# Patient Record
Sex: Female | Born: 1995
Health system: Southern US, Community
[De-identification: ages and names within clinical notes are randomized; demographics above are authoritative.]

## PROBLEM LIST (undated history)

## (undated) DIAGNOSIS — F329 Major depressive disorder, single episode, unspecified: Secondary | ICD-10-CM

## (undated) DIAGNOSIS — J45909 Unspecified asthma, uncomplicated: Secondary | ICD-10-CM

## (undated) DIAGNOSIS — F32A Depression, unspecified: Secondary | ICD-10-CM

## (undated) DIAGNOSIS — N938 Other specified abnormal uterine and vaginal bleeding: Secondary | ICD-10-CM

## (undated) DIAGNOSIS — E282 Polycystic ovarian syndrome: Secondary | ICD-10-CM

## (undated) DIAGNOSIS — F419 Anxiety disorder, unspecified: Secondary | ICD-10-CM

## (undated) DIAGNOSIS — R12 Heartburn: Secondary | ICD-10-CM

## (undated) HISTORY — DX: Heartburn: R12

## (undated) HISTORY — PX: TONSILLECTOMY: SUR1361

---
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
## 2013-04-11 ENCOUNTER — Encounter: Payer: Self-pay | Admitting: Family Medicine

## 2013-04-11 ENCOUNTER — Ambulatory Visit (INDEPENDENT_AMBULATORY_CARE_PROVIDER_SITE_OTHER): Payer: No Typology Code available for payment source | Admitting: Family Medicine

## 2013-04-11 ENCOUNTER — Telehealth: Payer: Self-pay | Admitting: Family Medicine

## 2013-04-11 VITALS — BP 128/72 | Ht 64.0 in | Wt 236.4 lb

## 2013-04-11 DIAGNOSIS — G43009 Migraine without aura, not intractable, without status migrainosus: Secondary | ICD-10-CM

## 2013-04-11 DIAGNOSIS — Z0000 Encounter for general adult medical examination without abnormal findings: Secondary | ICD-10-CM

## 2013-04-11 DIAGNOSIS — R5381 Other malaise: Secondary | ICD-10-CM

## 2013-04-11 DIAGNOSIS — E282 Polycystic ovarian syndrome: Secondary | ICD-10-CM

## 2013-04-11 DIAGNOSIS — K2990 Gastroduodenitis, unspecified, without bleeding: Secondary | ICD-10-CM

## 2013-04-11 DIAGNOSIS — E785 Hyperlipidemia, unspecified: Secondary | ICD-10-CM

## 2013-04-11 DIAGNOSIS — J45901 Unspecified asthma with (acute) exacerbation: Secondary | ICD-10-CM

## 2013-04-11 DIAGNOSIS — K2970 Gastritis, unspecified, without bleeding: Secondary | ICD-10-CM

## 2013-04-11 DIAGNOSIS — L600 Ingrowing nail: Secondary | ICD-10-CM

## 2013-04-11 MED ORDER — TOPIRAMATE 25 MG PO TABS: ORAL_TABLET | ORAL | Status: DC

## 2013-04-11 MED ORDER — ALBUTEROL SULFATE HFA 108 (90 BASE) MCG/ACT IN AERS: 2.0000 | INHALATION_SPRAY | Freq: Four times a day (QID) | RESPIRATORY_TRACT | Status: DC | PRN

## 2013-04-11 MED ORDER — DOXYCYCLINE HYCLATE 100 MG PO CAPS: 100.0000 mg | ORAL_CAPSULE | Freq: Two times a day (BID) | ORAL | Status: DC

## 2013-04-11 NOTE — Telephone Encounter (Signed)
Patient forgot to ask Dr. Brett Canales for a refill on inhaler at visit.  [facility]

## 2013-04-11 NOTE — Telephone Encounter (Signed)
Rx sent electronically to pharmacy. Patient notified. 

## 2013-04-11 NOTE — Telephone Encounter (Signed)
Ok may ref plus 2 ref 

## 2013-04-11 NOTE — Progress Notes (Signed)
  Subjective:    Patient ID: Stephanie Torres, female    DOB: Mar 03, 1996, 17 y.o.   MRN: [id number]  HPI Comments: Still having infection with her 2 great toes  Migraine  This is a recurrent problem. The current episode started 1 to 4 weeks ago. The problem occurs daily. The pain is located in the retro-orbital and left unilateral region. The pain radiates to the left neck and face. The pain quality is similar to prior headaches. Associated symptoms include eye pain. The symptoms are aggravated by sneezing and emotional stress (School). Treatments tried: Maxalt. The treatment provided mild relief.   Tried mxalt--didn't help much.  Pos sens to sound Of note the Maxalt helps the headaches a little but over-the-counter caffeine product analgesics seem to help more.  usin inhaler every couple days or so. Would like a refill on the inhaler. No chest pain with this.  Still having difficulty with ingrown toenails. See notes from this summer. We had to do a partial excision. Still having difficulties.  Mother appears surprised that the child has been given metformin for hyperinsulinemia. She states she did not realize that this was a pre-diabetes condition. Long discussion held in this regard.   Review of Systems  Eyes: Positive for pain.   No shortness of breath no abdominal pain no change in bowel habits some nausea with migraines is noted ROS otherwise negative.    Objective:   Physical Exam Alert no apparent distress. HEENT normal. Lungs clear. Heart regular rate and rhythm. Abdomen soft. Large toes left greater than right with chronic inflammation and in duration moderate tenderness on left       Assessment & Plan:  Impression #1 migraine headaches worsening at least 2 per week. #2 asthma fairly stable though not ideal discussed #3 impaired fasting glucose or prediabetes discussed at length. I tried to describe rationale for starting the metformin and how it may be of some benefit #4  recurrent ingrown nails with element of infection plan podiatry consult. Initiate Topamax. Antibiotics prescribed. Inhaler refilled. Diet exercise discussed. A least 35 minutes spent most in covering all of these complex issues. Followup with [facility] as originally requested earlier in the summer. Appropriate blood work before then. WSL

## 2013-04-12 DIAGNOSIS — L600 Ingrowing nail: Secondary | ICD-10-CM | POA: Insufficient documentation

## 2013-05-05 ENCOUNTER — Telehealth: Payer: Self-pay | Admitting: Family Medicine

## 2013-05-05 NOTE — Telephone Encounter (Signed)
Papers faxed to number listed. Mom was notified.

## 2013-05-05 NOTE — Telephone Encounter (Signed)
Patient has misplaced blood work orders and would like them faxed to [phone]

## 2013-05-07 ENCOUNTER — Ambulatory Visit: Payer: No Typology Code available for payment source | Admitting: Nurse Practitioner

## 2013-07-08 ENCOUNTER — Ambulatory Visit (INDEPENDENT_AMBULATORY_CARE_PROVIDER_SITE_OTHER): Payer: No Typology Code available for payment source | Admitting: Family Medicine

## 2013-07-08 ENCOUNTER — Encounter: Payer: Self-pay | Admitting: Family Medicine

## 2013-07-08 VITALS — BP 110/72 | Temp 98.2°F | Ht 64.0 in | Wt 235.0 lb

## 2013-07-08 DIAGNOSIS — L259 Unspecified contact dermatitis, unspecified cause: Secondary | ICD-10-CM

## 2013-07-08 MED ORDER — DESONIDE 0.05 % EX CREA: TOPICAL_CREAM | Freq: Two times a day (BID) | CUTANEOUS | Status: DC

## 2013-07-08 MED ORDER — PREDNISONE 20 MG PO TABS: ORAL_TABLET | ORAL | Status: AC

## 2013-07-08 NOTE — Progress Notes (Signed)
   Subjective:    Patient ID: Stephanie Torres, female    DOB: 11/01/1995, 18 y.o.   MRN: [id number]  Rash This is a new problem. The current episode started today. The affected locations include the face and left arm. The rash is characterized by redness.   No respiratory symptoms   Review of Systems  Skin: Positive for rash.   Denies fever pain headaches    Objective:   Physical Exam On the face she has significant contact dermatitis on left side of face also around the nasal fold area and not any on the neck lungs are clear hearts regular   Patient was seen after hours to prevent ER visit    Assessment & Plan:  Contact dermatitis could be due to poison ivy patient is not sure where she got exposed. Prednisone taper Benadryl when necessary may use steroid cream when necessary followup if ongoing troubles

## 2013-07-19 ENCOUNTER — Other Ambulatory Visit: Payer: Self-pay | Admitting: Family Medicine

## 2013-07-21 ENCOUNTER — Other Ambulatory Visit: Payer: Self-pay | Admitting: Nurse Practitioner

## 2013-07-21 ENCOUNTER — Telehealth: Payer: Self-pay | Admitting: Family Medicine

## 2013-07-21 MED ORDER — DROSPIRENONE-ETHINYL ESTRADIOL 3-0.03 MG PO TABS: ORAL_TABLET | ORAL | Status: DC

## 2013-07-21 NOTE — Telephone Encounter (Signed)
Refill sent in

## 2013-07-21 NOTE — Telephone Encounter (Signed)
Patient needs Rx for OCELLA 3-0.03 MG tablet. She is completely out and needs this today.    [facility]

## 2013-08-06 ENCOUNTER — Ambulatory Visit (INDEPENDENT_AMBULATORY_CARE_PROVIDER_SITE_OTHER): Payer: No Typology Code available for payment source | Admitting: Nurse Practitioner

## 2013-08-06 ENCOUNTER — Encounter: Payer: Self-pay | Admitting: Nurse Practitioner

## 2013-08-06 ENCOUNTER — Encounter: Payer: Self-pay | Admitting: Family Medicine

## 2013-08-06 VITALS — BP 122/82 | Temp 98.4°F | Ht 64.0 in | Wt 236.6 lb

## 2013-08-06 DIAGNOSIS — J111 Influenza due to unidentified influenza virus with other respiratory manifestations: Secondary | ICD-10-CM

## 2013-08-06 MED ORDER — OSELTAMIVIR PHOSPHATE 75 MG PO CAPS: 75.0000 mg | ORAL_CAPSULE | Freq: Two times a day (BID) | ORAL | Status: DC

## 2013-08-06 MED ORDER — DROSPIRENONE-ETHINYL ESTRADIOL 3-0.03 MG PO TABS: ORAL_TABLET | ORAL | Status: DC

## 2013-08-06 NOTE — Progress Notes (Signed)
Subjective:  Presents for c/o flu-like symptoms. Began nonprod cough and fever today. Myalgias.  Fatigue.Sore throat. Headache. Nausea no vomiting. Slight diarrhea about twice per day. Mild abd pain. Wheezing only with prolonged cough. Taking fluids well. Voiding nl. Requesting RF on oc's; denies any problems.  Objective:   BP 122/82  Temp(Src) 98.4 F (36.9 C) (Oral)  Ht 5\' 4"  (1.626 m)  Wt 236 lb 9.6 oz (107.321 kg)  BMI 40.59 kg/m2 NAD. Alert, oriented. TMs clear effusion. Pharynx clear. Neck supple with mild anterior adenopathy. Lungs clear. Heart RRR. Abd soft, nondistended with mild epigastric area tenderness.  Assessment: Influenza viral illness  Plan:  Meds ordered this encounter  Medications  . oseltamivir (TAMIFLU) 75 MG capsule    Sig: Take 1 capsule (75 mg total) by mouth 2 (two) times daily.    Dispense:  10 capsule    Refill:  0    Order Specific Question:  Supervising Provider    Answer:  Mikey Kirschner [[id number]]  . drospirenone-ethinyl estradiol (OCELLA) 3-0.03 MG tablet    Sig: One po qd as directed    Dispense:  28 tablet    Refill:  11    Order Specific Question:  Supervising Provider    Answer:  Mikey Kirschner [[id number]]   Influenza-the patient was diagnosed with influenza. Patient/family educated about the flu and warning signs to watch for. If difficulty breathing, severe neck pain and stiffness, cyanosis, disorientation, or progressive worsening then immediately get rechecked at that ER. If progressive symptoms be certain to be rechecked. Supportive measures such as Tylenol/ibuprofen was discussed. No aspirin use in children. And influenza home care instruction sheet was given. Call back in 48 hours if no improvement, sooner if worse.

## 2013-08-06 NOTE — Patient Instructions (Signed)

## 2013-09-14 ENCOUNTER — Encounter (HOSPITAL_COMMUNITY): Payer: Self-pay | Admitting: Emergency Medicine

## 2013-09-14 ENCOUNTER — Emergency Department (HOSPITAL_COMMUNITY)
Admission: EM | Admit: 2013-09-14 | Discharge: 2013-09-14 | Disposition: A | Discharge status: Home / Self Care | Payer: No Typology Code available for payment source | Attending: Emergency Medicine | Admitting: Emergency Medicine

## 2013-09-14 DIAGNOSIS — IMO0002 Reserved for concepts with insufficient information to code with codable children: Secondary | ICD-10-CM | POA: Insufficient documentation

## 2013-09-14 DIAGNOSIS — Z3202 Encounter for pregnancy test, result negative: Secondary | ICD-10-CM | POA: Insufficient documentation

## 2013-09-14 DIAGNOSIS — R110 Nausea: Secondary | ICD-10-CM | POA: Insufficient documentation

## 2013-09-14 DIAGNOSIS — N946 Dysmenorrhea, unspecified: Secondary | ICD-10-CM | POA: Insufficient documentation

## 2013-09-14 DIAGNOSIS — Z79899 Other long term (current) drug therapy: Secondary | ICD-10-CM | POA: Insufficient documentation

## 2013-09-14 DIAGNOSIS — J45909 Unspecified asthma, uncomplicated: Secondary | ICD-10-CM | POA: Insufficient documentation

## 2013-09-14 HISTORY — DX: Unspecified asthma, uncomplicated: J45.909

## 2013-09-14 LAB — PREGNANCY, URINE: Preg Test, Ur: NEGATIVE

## 2013-09-14 LAB — URINALYSIS, ROUTINE W REFLEX MICROSCOPIC
Bilirubin Urine: NEGATIVE
Glucose, UA: NEGATIVE mg/dL
Ketones, ur: NEGATIVE mg/dL
Leukocytes, UA: NEGATIVE
Nitrite: NEGATIVE
Protein, ur: NEGATIVE mg/dL
Specific Gravity, Urine: >1.03 — ABNORMAL HIGH (ref 1.005–1.030)
Urobilinogen, UA: 0.2 mg/dL (ref 0.0–1.0)
pH: 6 (ref 5.0–8.0)

## 2013-09-14 LAB — URINE MICROSCOPIC-ADD ON

## 2013-09-14 MED ORDER — TRAMADOL HCL 50 MG PO TABS: 50.0000 mg | ORAL_TABLET | Freq: Four times a day (QID) | ORAL | Status: DC | PRN

## 2013-09-14 MED ORDER — IBUPROFEN 800 MG PO TABS
800.0000 mg | ORAL_TABLET | Freq: Once | ORAL | Status: AC
  Administered 2013-09-14: 800 mg via ORAL
  Filled 2013-09-14: qty 1

## 2013-09-14 MED ORDER — HYDROCODONE-ACETAMINOPHEN 5-325 MG PO TABS
2.0000 | ORAL_TABLET | Freq: Once | ORAL | Status: AC
  Administered 2013-09-14: 2 via ORAL
  Filled 2013-09-14: qty 2

## 2013-09-14 NOTE — ED Provider Notes (Signed)
CSN: [id number]     Arrival date & time 09/14/13  2140 History  This chart was scribed for Stephanie Manifold, MD by [name], ED Scribe. This patient was seen in room APA14/APA14 and the patient's care was started at 10:15 PM.   Chief Complaint  Patient presents with  . Pelvic Pain  . Dysmenorrhea     The history is provided by the patient. No language interpreter was used.    HPI Comments: Stephanie Torres is a 18 y.o. female who presents to the Emergency Department complaining of severe mid pelvic pain that radiates to the lower back described as pinching that has been constant and gradually worsening since she started her menses today. The pain is worsened with movement and she reports associated nausea. She has tried "everything" including Midol and 800 mg IBU without improvement. She is currently on the birth control Ocella but stopped taking it last month due to it's side effects. She states that her menses were starting one week early on the active pills. She has an appointment with follow up with the health department tomorrow but expresses concern over ovarian cancer in her family and states that she can't wait until tomorrow. She reports taking an at-home pregnancy test that was negative. She denies any urinary symptoms.    Past Medical History  Diagnosis Date  . Asthma    Past Surgical History  Procedure Laterality Date  . Tonsillectomy     History reviewed. No pertinent family history. History  Substance Use Topics  . Smoking status: Never Smoker   . Smokeless tobacco: Not on file  . Alcohol Use: No   No OB history provided.  Review of Systems  Gastrointestinal: Positive for nausea. Negative for vomiting.  Genitourinary: Positive for vaginal bleeding and pelvic pain. Negative for dysuria and hematuria.  All other systems reviewed and are negative.   Allergies  Review of patient's allergies indicates no known allergies.  Home Medications   Current Outpatient  Rx  Name  Route  Sig  Dispense  Refill  . albuterol (PROVENTIL HFA;VENTOLIN HFA) 108 (90 BASE) MCG/ACT inhaler   Inhalation   Inhale 2 puffs into the lungs every 6 (six) hours as needed for wheezing or shortness of breath.   1 Inhaler   2   . cetirizine (ZYRTEC) 10 MG tablet   Oral   Take 10 mg by mouth daily.         . clonazePAM (KLONOPIN) 0.5 MG tablet      1/2-1 po qd prn anxiety   20 tablet   0   . desonide (DESOWEN) 0.05 % cream   Topical   Apply topically 2 (two) times daily.   30 g   0   . DiphenhydrAMINE HCl (BENADRYL ALLERGY PO)   Oral   Take by mouth.         . drospirenone-ethinyl estradiol (OCELLA) 3-0.03 MG tablet      One po qd as directed   28 tablet   11   . metFORMIN (GLUCOPHAGE) 500 MG tablet   Oral   Take 1 tablet (500 mg total) by mouth 2 (two) times daily with a meal.   60 tablet   5   . oseltamivir (TAMIFLU) 75 MG capsule   Oral   Take 1 capsule (75 mg total) by mouth 2 (two) times daily.   10 capsule   0   . pantoprazole (PROTONIX) 40 MG tablet   Oral   Take 1 tablet (  40 mg total) by mouth daily.   30 tablet   2   . rizatriptan (MAXALT-MLT) 10 MG disintegrating tablet   Oral   Take 1 tablet (10 mg total) by mouth as needed for migraine. May repeat in 2 hours if needed   10 tablet   0   . topiramate (TOPAMAX) 25 MG tablet      Take one qhs for 7 days, the take one po BID   60 tablet   0    Triage vitals: BP 133/69  Pulse 86  Temp(Src) 98.2 F (36.8 C) (Oral)  Resp 20  Ht 5\' 4"  (1.626 m)  Wt 220 lb (99.791 kg)  BMI 37.74 kg/m2  SpO2 100%  LMP 09/14/2013  Physical Exam  Nursing note and vitals reviewed. Constitutional: She is oriented to person, place, and time. She appears well-developed and well-nourished. No distress.  HENT:  Head: Normocephalic and atraumatic.  Eyes: EOM are normal.  Neck: Normal range of motion.  Cardiovascular: Normal rate, regular rhythm and normal heart sounds.   Pulmonary/Chest:  Effort normal and breath sounds normal.  Abdominal: Soft. She exhibits no distension. There is tenderness (mild suprapubic tenderness). There is no rebound and no guarding.  No CVA tenderness  Musculoskeletal: Normal range of motion.  Neurological: She is alert and oriented to person, place, and time.  Skin: Skin is warm and dry.  Psychiatric: She has a normal mood and affect. Judgment normal.    ED Course  Procedures (including critical care time)  Medications  HYDROcodone-acetaminophen (NORCO/VICODIN) 5-325 MG per tablet 2 tablet (not administered)  ibuprofen (ADVIL,MOTRIN) tablet 800 mg (not administered)    DIAGNOSTIC STUDIES: Oxygen Saturation is 100% on RA, normal by my interpretation.    COORDINATION OF CARE: 10:30 PM-Discussed treatment plan which includes medications and UA with pt at bedside and pt agreed to plan.   Labs Review Labs Reviewed  URINALYSIS, ROUTINE W REFLEX MICROSCOPIC - Abnormal; Notable for the following:    Color, Urine AMBER (*)    APPearance CLOUDY (*)    Specific Gravity, Urine >1.030 (*)    Hgb urine dipstick LARGE (*)    All other components within normal limits  PREGNANCY, URINE  URINE MICROSCOPIC-ADD ON   Imaging Review No results found.   EKG Interpretation None      MDM   Final diagnoses:  Dysmenorrhea   Likely menstrual cramps. Benign exam. HD stable. Not pregnant. UA w/o signs of infection. Symptomatic tx.   I personally preformed the services scribed in my presence. The recorded information has been reviewed is accurate. Stephanie Manifold, MD.     Stephanie Manifold, MD 09/18/13 [phone]

## 2013-09-14 NOTE — ED Notes (Signed)
Patient reports started menstrual cycle this morning. Complaining of pelvic pain, low back pain, and cramping.

## 2013-09-14 NOTE — Discharge Instructions (Signed)

## 2013-09-15 ENCOUNTER — Encounter: Payer: Self-pay | Admitting: Nurse Practitioner

## 2013-09-15 ENCOUNTER — Ambulatory Visit (INDEPENDENT_AMBULATORY_CARE_PROVIDER_SITE_OTHER): Payer: No Typology Code available for payment source | Admitting: Nurse Practitioner

## 2013-09-15 VITALS — BP 128/88 | Temp 98.1°F | Ht 64.0 in | Wt 234.0 lb

## 2013-09-15 DIAGNOSIS — R7989 Other specified abnormal findings of blood chemistry: Secondary | ICD-10-CM

## 2013-09-15 DIAGNOSIS — D509 Iron deficiency anemia, unspecified: Secondary | ICD-10-CM

## 2013-09-15 DIAGNOSIS — N938 Other specified abnormal uterine and vaginal bleeding: Secondary | ICD-10-CM

## 2013-09-15 DIAGNOSIS — E282 Polycystic ovarian syndrome: Secondary | ICD-10-CM

## 2013-09-15 DIAGNOSIS — N949 Unspecified condition associated with female genital organs and menstrual cycle: Secondary | ICD-10-CM

## 2013-09-15 DIAGNOSIS — R946 Abnormal results of thyroid function studies: Secondary | ICD-10-CM

## 2013-09-15 DIAGNOSIS — E161 Other hypoglycemia: Secondary | ICD-10-CM

## 2013-09-15 MED ORDER — NORETHINDRONE ACET-ETHINYL EST 1.5-30 MG-MCG PO TABS: 1.0000 | ORAL_TABLET | Freq: Every day | ORAL | Status: DC

## 2013-09-17 ENCOUNTER — Encounter: Payer: Self-pay | Admitting: Nurse Practitioner

## 2013-09-17 NOTE — Progress Notes (Signed)
Subjective:  Presents for recheck after ER visit yesterday. Was seen for severe dysmenorrhea. Patient stopped her birth control pills last month about halfway through the pack, was having some breakthrough bleeding. Had a normal light cycle when she stopped her pills. Yesterday began having worse cramps she has ever had with heavier bleeding than usual. No discharge. No pelvic pain. No fever. No excessive hair growth. Some acne. Some weight gain. History of PCOS and hyperinsulinemia. Has not had lab work done in a while.  Objective:   BP 128/88  Temp(Src) 98.1 F (36.7 C)  Ht 5\' 4"  (1.626 m)  Wt 234 lb (106.142 kg)  BMI 40.15 kg/m2  LMP 09/14/2013 NAD. Alert, oriented. Lungs clear. Heart regular rate rhythm. Significant central obesity. Urine pregnancy test negative yesterday.   Assessment: Problem List Items Addressed This Visit     Endocrine   Hyperinsulinemia   PCOS (polycystic ovarian syndrome) - Primary     Other   Morbid obesity   Iron deficiency anemia   Relevant Orders      CBC with Differential   Abnormal thyroid blood test   Relevant Orders      Hemoglobin A1c    Other Visit Diagnoses   DUB (dysfunctional uterine bleeding)           Plan: Meds ordered this encounter  Medications  . Norethindrone Acetate-Ethinyl Estradiol (JUNEL,LOESTRIN,MICROGESTIN) 1.5-30 MG-MCG tablet    Sig: Take 1 tablet by mouth daily.    Dispense:  1 Package    Refill:  11    Order Specific Question:  Supervising Provider    Answer:  Mikey Kirschner [[id number]]   start pills today. Consider taking pills continuously. Call back if any heavy bleeding or significant dysmenorrhea. Again discussed importance of weight loss and regular activity. Lab work ordered. Return if symptoms worsen or fail to improve.

## 2013-10-02 LAB — CBC WITH DIFFERENTIAL/PLATELET
Basophils Absolute: 0 10*3/uL (ref 0.0–0.1)
Basophils Relative: 0 % (ref 0–1)
EOS PCT: 4 % (ref 0–5)
Eosinophils Absolute: 0.4 10*3/uL (ref 0.0–0.7)
HEMATOCRIT: 32.3 % — AB (ref 36.0–46.0)
HEMOGLOBIN: 10.7 g/dL — AB (ref 12.0–15.0)
LYMPHS ABS: 3 10*3/uL (ref 0.7–4.0)
LYMPHS PCT: 32 % (ref 12–46)
MCH: 21.6 pg — ABNORMAL LOW (ref 26.0–34.0)
MCHC: 33.1 g/dL (ref 30.0–36.0)
MCV: 65.3 fL — AB (ref 78.0–100.0)
MONOS PCT: 6 % (ref 3–12)
Monocytes Absolute: 0.6 10*3/uL (ref 0.1–1.0)
Neutro Abs: 5.5 10*3/uL (ref 1.7–7.7)
Neutrophils Relative %: 58 % (ref 43–77)
Platelets: 403 10*3/uL — ABNORMAL HIGH (ref 150–400)
RBC: 4.95 MIL/uL (ref 3.87–5.11)
RDW: 16.3 % — ABNORMAL HIGH (ref 11.5–15.5)
WBC: 9.5 10*3/uL (ref 4.0–10.5)

## 2013-10-02 LAB — HEMOGLOBIN A1C
Hgb A1c MFr Bld: 5.7 % — ABNORMAL HIGH (ref <5.7)
Mean Plasma Glucose: 117 mg/dL — ABNORMAL HIGH (ref <117)

## 2013-10-15 ENCOUNTER — Ambulatory Visit (INDEPENDENT_AMBULATORY_CARE_PROVIDER_SITE_OTHER): Payer: No Typology Code available for payment source | Admitting: Nurse Practitioner

## 2013-10-15 ENCOUNTER — Encounter: Payer: Self-pay | Admitting: Nurse Practitioner

## 2013-10-15 VITALS — BP 128/88 | Ht 64.0 in | Wt 233.0 lb

## 2013-10-15 DIAGNOSIS — N938 Other specified abnormal uterine and vaginal bleeding: Secondary | ICD-10-CM

## 2013-10-15 DIAGNOSIS — D509 Iron deficiency anemia, unspecified: Secondary | ICD-10-CM

## 2013-10-15 DIAGNOSIS — N949 Unspecified condition associated with female genital organs and menstrual cycle: Secondary | ICD-10-CM

## 2013-10-15 MED ORDER — DROSPIRENONE-ETHINYL ESTRADIOL 3-0.03 MG PO TABS: 1.0000 | ORAL_TABLET | Freq: Every day | ORAL | Status: DC

## 2013-10-15 NOTE — Patient Instructions (Addendum)
Melatonin 5 mg 30 minutes before bed You may have breakthrough bleeding; call if very heavy or lasting more than 7 days Take daily vitamin with iron

## 2013-10-17 ENCOUNTER — Encounter: Payer: Self-pay | Admitting: Nurse Practitioner

## 2013-10-17 NOTE — Progress Notes (Signed)
Subjective:  Presents for recheck on her oral contraceptives. Has been off of her pill for 2 days. Experienced depression and insomnia after starting this birth control pill. Denies any suicidal or homicidal thoughts or ideation. Depression has resolved since stopping her pill. Continues to have some insomnia. Denies any excessive stress or other triggers. Would like to go back on [place] which she has taken without difficulty. Has had some breakthrough bleeding. Denies any missed pills. Last intercourse was about a week ago, used condoms. No pelvic pain fever or discharge. Denies any drug or alcohol use.  Objective:   BP 128/88  Ht 5' 4"  (1.626 m)  Wt 233 lb (105.688 kg)  BMI 39.97 kg/m2  LMP 10/06/2013 NAD. Alert, oriented. Lungs clear. Heart regular rate rhythm. Pupils are very dilated but responsive to light. Thoughts logical coherent and relevant. Mildly anxious affect. Results for orders placed in visit on 09/15/13  HEMOGLOBIN A1C      Result Value Ref Range   Hemoglobin A1C 5.7 (*) <5.7 %   Mean Plasma Glucose 117 (*) <117 mg/dL  CBC WITH DIFFERENTIAL      Result Value Ref Range   WBC 9.5  4.0 - 10.5 K/uL   RBC 4.95  3.87 - 5.11 MIL/uL   Hemoglobin 10.7 (*) 12.0 - 15.0 g/dL   HCT 32.3 (*) 36.0 - 46.0 %   MCV 65.3 (*) 78.0 - 100.0 fL   MCH 21.6 (*) 26.0 - 34.0 pg   MCHC 33.1  30.0 - 36.0 g/dL   RDW 16.3 (*) 11.5 - 15.5 %   Platelets 403 (*) 150 - 400 K/uL   Neutrophils Relative % 58  43 - 77 %   Neutro Abs 5.5  1.7 - 7.7 K/uL   Lymphocytes Relative 32  12 - 46 %   Lymphs Abs 3.0  0.7 - 4.0 K/uL   Monocytes Relative 6  3 - 12 %   Monocytes Absolute 0.6  0.1 - 1.0 K/uL   Eosinophils Relative 4  0 - 5 %   Eosinophils Absolute 0.4  0.0 - 0.7 K/uL   Basophils Relative 0  0 - 1 %   Basophils Absolute 0.0  0.0 - 0.1 K/uL   Smear Review Criteria for review not met       Assessment: Iron deficiency anemia  DUB (dysfunctional uterine bleeding)  Change in mood secondary to OC  use  Plan:  Meds ordered this encounter  Medications  . drospirenone-ethinyl estradiol (YASMIN,ZARAH,SYEDA) 3-0.03 MG tablet    Sig: Take 1 tablet by mouth daily. continuously    Dispense:  1 Package    Refill:  11    Please send PA if we need to do this for insurance; prescribed continuous oc for dysmenorrhea and DUB.    Order Specific Question:  Supervising Provider    Answer:  Mikey Kirschner [[id number]]   Take daily multivitamin with iron. Discussed options regarding contraceptives. Written prescription for continuous birth control pills if insurance will allow it. Call back if any further problems.

## 2013-10-31 ENCOUNTER — Telehealth: Payer: Self-pay | Admitting: Family Medicine

## 2013-10-31 MED ORDER — ALBUTEROL SULFATE HFA 108 (90 BASE) MCG/ACT IN AERS: 2.0000 | INHALATION_SPRAY | Freq: Four times a day (QID) | RESPIRATORY_TRACT | Status: DC | PRN

## 2013-10-31 NOTE — Telephone Encounter (Signed)
Patient needs albuterol inhaler. Med sent electronically to pharmacy. Patient notified.

## 2013-10-31 NOTE — Telephone Encounter (Signed)
Clarify with family which inhaler, likely albuterol, may ref plus two ref

## 2013-10-31 NOTE — Telephone Encounter (Signed)
Patient needs Rx for inhaler to [facility].

## 2014-01-01 ENCOUNTER — Encounter: Payer: Self-pay | Admitting: Nurse Practitioner

## 2014-01-01 ENCOUNTER — Ambulatory Visit (INDEPENDENT_AMBULATORY_CARE_PROVIDER_SITE_OTHER): Payer: No Typology Code available for payment source | Admitting: Nurse Practitioner

## 2014-01-01 VITALS — BP 112/74 | Temp 98.4°F | Ht 64.0 in | Wt 233.0 lb

## 2014-01-01 DIAGNOSIS — R062 Wheezing: Secondary | ICD-10-CM

## 2014-01-01 DIAGNOSIS — N951 Menopausal and female climacteric states: Secondary | ICD-10-CM

## 2014-01-01 DIAGNOSIS — Z3009 Encounter for other general counseling and advice on contraception: Secondary | ICD-10-CM

## 2014-01-01 DIAGNOSIS — R109 Unspecified abdominal pain: Secondary | ICD-10-CM

## 2014-01-01 DIAGNOSIS — R232 Flushing: Secondary | ICD-10-CM

## 2014-01-01 DIAGNOSIS — M545 Low back pain, unspecified: Secondary | ICD-10-CM

## 2014-01-01 DIAGNOSIS — J45901 Unspecified asthma with (acute) exacerbation: Secondary | ICD-10-CM

## 2014-01-01 DIAGNOSIS — J4521 Mild intermittent asthma with (acute) exacerbation: Secondary | ICD-10-CM

## 2014-01-01 DIAGNOSIS — E161 Other hypoglycemia: Secondary | ICD-10-CM

## 2014-01-01 DIAGNOSIS — D509 Iron deficiency anemia, unspecified: Secondary | ICD-10-CM

## 2014-01-01 LAB — POCT HEMOGLOBIN: Hemoglobin: 11.4 g/dL — AB (ref 12.2–16.2)

## 2014-01-01 LAB — POCT URINALYSIS DIPSTICK
Spec Grav, UA: >=1.03
pH, UA: 5

## 2014-01-01 LAB — POCT URINE PREGNANCY: Preg Test, Ur: NEGATIVE

## 2014-01-01 LAB — POCT GLUCOSE (DEVICE FOR HOME USE): POC Glucose: 161 mg/dl — AB (ref 70–99)

## 2014-01-01 LAB — POCT GLYCOSYLATED HEMOGLOBIN (HGB A1C): HEMOGLOBIN A1C: 5.6

## 2014-01-03 LAB — URINE CULTURE: Colony Count: 50000

## 2014-01-06 ENCOUNTER — Encounter: Payer: Self-pay | Admitting: Nurse Practitioner

## 2014-01-06 NOTE — Progress Notes (Signed)
Patient notified and verbalized understanding of the test results. No further questions. 

## 2014-01-06 NOTE — Progress Notes (Signed)
Subjective:  Presents complaints of nausea body aches and hot flashes for the last 2 weeks. Some mid back pain. Has not taken any medication. No vomiting. No diarrhea. No cough runny nose. No wheezing. No sore throat or ear pain. Some urinary frequency, mild incontinence only with sneezing. No upper abdominal pain. Occasional generalized lower abdominal cramping. Has been off her birth control pills for about a month, felt moody and more depressed, has improved since stopping pills. Last menstrual cycle 7/6-10. Had some spotting 2 weeks after this for about 4 days. Current symptoms began a few days after her cycle. Has had 2 negative pregnancy test outside of the office. Same sexual partner. No vaginal discharge. No fever. Some right low back pain localized.  Objective:   BP 112/74  Temp(Src) 98.4 F (36.9 C) (Oral)  Ht 5\' 4"  (1.626 m)  Wt 233 lb (105.688 kg)  BMI 39.97 kg/m2 NAD. Alert, oriented. TMs mild clear effusion, no erythema. Pharynx clear. Neck supple with mild soft anterior adenopathy. Lungs slight scattered expiratory wheeze, no tachypnea. Normal color. Heart regular rate rhythm. Abdomen soft nondistended with mild mid lower abdominal tenderness. UA negative for leukocytes or blood. Hemoglobin A1c 5.6. Hemoglobin 11.4. Nonfasting glucose 161. Urine hCG negative. Mild right lumbar pain to palpation, SLR negative. Reflexes normal limit lower extremities. Gait normal limit.  Assessment:  Problem List Items Addressed This Visit     Respiratory   Asthma with acute exacerbation   Relevant Orders      DG Chest 2 View     Endocrine   Hyperinsulinemia   Relevant Orders      POCT glycosylated hemoglobin (Hb A1C) (Completed)      POCT Glucose (Device for Home Use) (Completed)     Other   Iron deficiency anemia   Relevant Orders      POCT hemoglobin (Completed)      CBC with Differential    Other Visit Diagnoses   Abdominal pain, unspecified site    -  Primary    Relevant Orders      POCT urinalysis dipstick (Completed)       POCT urine pregnancy (Completed)       Urine culture (Completed)       Basic metabolic panel    Right-sided low back pain without sciatica        Wheezing        Relevant Orders       DG Chest 2 View    Hot flashes        Relevant Orders       Basic metabolic panel    Other general counseling and advice for contraceptive management            Plan: Ice/heat to the back area. OTC anti-inflammatories as directed. Albuterol inhaler as directed. Discussed contraceptive options, patient wants to think about this and get back with [place]. Discussed safe sex issues. Warning signs reviewed. Further followup based on test results, call back sooner if symptoms worsen.

## 2014-01-15 ENCOUNTER — Encounter: Payer: Self-pay | Admitting: Nurse Practitioner

## 2014-01-15 ENCOUNTER — Ambulatory Visit (INDEPENDENT_AMBULATORY_CARE_PROVIDER_SITE_OTHER): Payer: No Typology Code available for payment source | Admitting: Nurse Practitioner

## 2014-01-15 VITALS — BP 128/84 | Ht 64.0 in | Wt 232.0 lb

## 2014-01-15 DIAGNOSIS — L678 Other hair color and hair shaft abnormalities: Secondary | ICD-10-CM

## 2014-01-15 DIAGNOSIS — N946 Dysmenorrhea, unspecified: Secondary | ICD-10-CM

## 2014-01-15 DIAGNOSIS — L738 Other specified follicular disorders: Secondary | ICD-10-CM

## 2014-01-15 DIAGNOSIS — A084 Viral intestinal infection, unspecified: Secondary | ICD-10-CM

## 2014-01-15 DIAGNOSIS — L739 Follicular disorder, unspecified: Secondary | ICD-10-CM

## 2014-01-15 DIAGNOSIS — A088 Other specified intestinal infections: Secondary | ICD-10-CM

## 2014-01-15 MED ORDER — DROSPIRENONE-ETHINYL ESTRADIOL 3-0.03 MG PO TABS: 1.0000 | ORAL_TABLET | Freq: Every day | ORAL | Status: DC

## 2014-01-15 MED ORDER — OMEPRAZOLE 40 MG PO CPDR: 40.0000 mg | DELAYED_RELEASE_CAPSULE | Freq: Every day | ORAL | Status: DC

## 2014-01-20 ENCOUNTER — Encounter: Payer: Self-pay | Admitting: Nurse Practitioner

## 2014-01-20 NOTE — Progress Notes (Signed)
Subjective:  Presents for several issues. Would like to restart Ocella for birth control and painful cycles. Started a normal menstrual cycle today. Complaints of vomiting and diarrhea that started earlier today, much improved. Now having just nausea. Taking fluids well. Voiding normal limit. Same sexual partner. Minimal abdominal pain. Small resolving boil on her inner thigh. No fever.  No back pain.   Objective:   BP 128/84  Ht 5\' 4"  (1.626 m)  Wt 232 lb (105.235 kg)  BMI 39.80 kg/m2  LMP 01/14/2014 NAD. Alert, oriented. Lungs clear. Heart regular rate rhythm. Abdomen soft nondistended with mild tenderness towards the right outer epigastric area. No obvious masses. No rebound or guarding. Small resolving pink lesion noted on the upper thigh area.   Assessment: Viral gastroenteritis  Folliculitis  Dysmenorrhea  Plan:  Meds ordered this encounter  Medications  . omeprazole (PRILOSEC) 40 MG capsule    Sig: Take 1 capsule (40 mg total) by mouth daily.    Dispense:  30 capsule    Refill:  2    Order Specific Question:  Supervising Provider    Answer:  Mikey Kirschner [[id number]]  . drospirenone-ethinyl estradiol (YASMIN,ZARAH,SYEDA) 3-0.03 MG tablet    Sig: Take 1 tablet by mouth daily.    Dispense:  1 Package    Refill:  11    Order Specific Question:  Supervising Provider    Answer:  Mikey Kirschner [[id number]]     reminded about lifestyle factors affecting her reflux symptoms. No indication this time for a workup for gallbladder, patient to call back if symptoms worsen or persist. Start birth control pills this Sunday use backup method first pack. Discussed safe sex issues.

## 2014-05-20 ENCOUNTER — Encounter: Payer: Self-pay | Admitting: Nurse Practitioner

## 2014-05-20 ENCOUNTER — Ambulatory Visit (INDEPENDENT_AMBULATORY_CARE_PROVIDER_SITE_OTHER): Payer: Self-pay | Admitting: Nurse Practitioner

## 2014-05-20 VITALS — BP 120/88 | Temp 97.6°F | Ht 64.0 in | Wt 225.4 lb

## 2014-05-20 DIAGNOSIS — R062 Wheezing: Secondary | ICD-10-CM

## 2014-05-20 DIAGNOSIS — J209 Acute bronchitis, unspecified: Secondary | ICD-10-CM

## 2014-05-20 DIAGNOSIS — J0120 Acute ethmoidal sinusitis, unspecified: Secondary | ICD-10-CM

## 2014-05-20 DIAGNOSIS — K219 Gastro-esophageal reflux disease without esophagitis: Secondary | ICD-10-CM

## 2014-05-20 MED ORDER — AZITHROMYCIN 250 MG PO TABS: ORAL_TABLET | ORAL | Status: DC

## 2014-05-20 MED ORDER — OMEPRAZOLE 40 MG PO CPDR: 40.0000 mg | DELAYED_RELEASE_CAPSULE | Freq: Every day | ORAL | Status: DC

## 2014-05-20 MED ORDER — PREDNISONE 20 MG PO TABS: ORAL_TABLET | ORAL | Status: DC

## 2014-05-23 ENCOUNTER — Encounter: Payer: Self-pay | Admitting: Nurse Practitioner

## 2014-05-23 NOTE — Progress Notes (Signed)
Subjective:  Presents complaints of congestion and cough over the past 2 weeks. Cough has worsened. Clear nasal drainage. Using her albuterol inhaler about 3 times a day, especially at night over the past few days for wheezing. Fever 2 days ago. Ethmoid sinus area headache. No vomiting diarrhea or abdominal pain. Has had reflux over the past week and a half. Has been off her omeprazole. Mild sore throat. No ear pain. Taking fluids well. Voiding normal limit.  Objective:   BP 120/88 mmHg  Temp(Src) 97.6 F (36.4 C) (Oral)  Ht 5\' 4"  (1.626 m)  Wt 225 lb 6 oz (102.229 kg)  BMI 38.67 kg/m2 NAD. Alert, oriented. TMs clear effusion, no erythema. Pharynx mildly erythematous with green PND noted. Neck supple with mild soft anterior adenopathy. Lungs 1 faint expiratory wheeze left anterior lobe, scattered expiratory crackles noted. No tachypnea. Heart regular rate rhythm. Abdomen soft nondistended with mild epigastric area tenderness.  Assessment: Acute ethmoidal sinusitis, recurrence not specified  Acute bronchitis, unspecified organism  Wheezing  Gastroesophageal reflux disease, esophagitis presence not specified  Plan:  Meds ordered this encounter  Medications  . azithromycin (ZITHROMAX Z-PAK) 250 MG tablet    Sig: Take 2 tablets (500 mg) on  Day 1,  followed by 1 tablet (250 mg) once daily on Days 2 through 5.    Dispense:  6 each    Refill:  0    Order Specific Question:  Supervising Provider    Answer:  Stephanie Torres [[id number]]  . predniSONE (DELTASONE) 20 MG tablet    Sig: One twice a day x 5 d    Dispense:  10 tablet    Refill:  0    Order Specific Question:  Supervising Provider    Answer:  Stephanie Torres [[id number]]  . omeprazole (PRILOSEC) 40 MG capsule    Sig: Take 1 capsule (40 mg total) by mouth daily.    Dispense:  30 capsule    Refill:  2    Order Specific Question:  Supervising Provider    Answer:  Stephanie Torres [[id number]]   Restart omeprazole as directed. Reviewed  lifestyle factors affecting her reflux disease. Continue omeprazole until reflux symptoms and abdominal pain have resolved. Call back in 2 weeks if persists. Continue albuterol as directed. Call back in 48-72 hours if no improvement, sooner if worse.

## 2014-06-11 ENCOUNTER — Encounter: Payer: Medicaid Other | Admitting: Nurse Practitioner

## 2014-07-27 ENCOUNTER — Ambulatory Visit (INDEPENDENT_AMBULATORY_CARE_PROVIDER_SITE_OTHER): Payer: BLUE CROSS/BLUE SHIELD | Admitting: Nurse Practitioner

## 2014-07-27 ENCOUNTER — Encounter: Payer: Self-pay | Admitting: Nurse Practitioner

## 2014-07-27 VITALS — BP 110/78 | Ht 64.0 in | Wt 230.8 lb

## 2014-07-27 DIAGNOSIS — N912 Amenorrhea, unspecified: Secondary | ICD-10-CM

## 2014-07-28 ENCOUNTER — Encounter: Payer: Self-pay | Admitting: Nurse Practitioner

## 2014-07-28 NOTE — Progress Notes (Signed)
Subjective:  Presents with her boyfriend for amenorrhea and 4 positive home pregnancy tests. Last normal menses 06/11/14. No bleeding or spotting. Not taking MVI. Nonsmoker. No alcohol or drug use. Has a cat but boyfriend has been emptying the litter box. Some nausea.   Objective:   BP 110/78 mmHg  Ht 5\' 4"  (1.626 m)  Wt 230 lb 12.8 oz (104.69 kg)  BMI 39.60 kg/m2 NAD. Alert, oriented. Lungs clear. Heart RRR. Abdomen soft, non tender.   Assessment: Amenorrhea/Pregnancy  Plan: start OTC MVI today. Reviewed early prenatal care. Stop all medications including OTC unless approved by OB. Given contact information for OB office in [place].

## 2015-06-02 ENCOUNTER — Ambulatory Visit: Payer: BLUE CROSS/BLUE SHIELD | Admitting: Family Medicine

## 2015-07-22 ENCOUNTER — Ambulatory Visit (INDEPENDENT_AMBULATORY_CARE_PROVIDER_SITE_OTHER): Payer: BLUE CROSS/BLUE SHIELD | Admitting: Family Medicine

## 2015-07-22 VITALS — Temp 97.7°F | Ht 64.0 in | Wt 241.5 lb

## 2015-07-22 DIAGNOSIS — B9689 Other specified bacterial agents as the cause of diseases classified elsewhere: Secondary | ICD-10-CM

## 2015-07-22 DIAGNOSIS — J019 Acute sinusitis, unspecified: Secondary | ICD-10-CM | POA: Diagnosis not present

## 2015-07-22 MED ORDER — AMOXICILLIN 500 MG PO TABS: 500.0000 mg | ORAL_TABLET | Freq: Three times a day (TID) | ORAL | Status: DC

## 2015-07-22 NOTE — Progress Notes (Signed)
   Subjective:    Patient ID: Stephanie Torres, female    DOB: 11-03-1995, 20 y.o.   MRN: [id number]  Cough This is a new problem. The current episode started in the past 7 days. Associated symptoms include ear pain, rhinorrhea, a sore throat and wheezing. Associated symptoms comments: Runny nose. Treatments tried: Dayquil, Nyquil.   Patient states no other concerns this visit. PMH benign  Review of Systems  HENT: Positive for ear pain, rhinorrhea and sore throat.   Respiratory: Positive for cough and wheezing.        Objective:   Physical Exam Lungs clear heart regular neck no masses  Mild sinus tenderness eardrums normal     Assessment & Plan:  Sinusitis antibiotics prescribed warning signs discussed follow-up if ongoing troubles

## 2015-10-21 ENCOUNTER — Emergency Department (HOSPITAL_COMMUNITY)
Admission: EM | Admit: 2015-10-21 | Discharge: 2015-10-21 | Disposition: A | Discharge status: Home / Self Care | Payer: BLUE CROSS/BLUE SHIELD | Attending: Emergency Medicine | Admitting: Emergency Medicine

## 2015-10-21 ENCOUNTER — Encounter (HOSPITAL_COMMUNITY): Payer: Self-pay

## 2015-10-21 DIAGNOSIS — J45909 Unspecified asthma, uncomplicated: Secondary | ICD-10-CM | POA: Diagnosis not present

## 2015-10-21 DIAGNOSIS — E663 Overweight: Secondary | ICD-10-CM | POA: Diagnosis not present

## 2015-10-21 DIAGNOSIS — J029 Acute pharyngitis, unspecified: Secondary | ICD-10-CM | POA: Insufficient documentation

## 2015-10-21 DIAGNOSIS — Z79899 Other long term (current) drug therapy: Secondary | ICD-10-CM | POA: Insufficient documentation

## 2015-10-21 DIAGNOSIS — R070 Pain in throat: Secondary | ICD-10-CM | POA: Diagnosis not present

## 2015-10-21 MED ORDER — GI COCKTAIL ~~LOC~~
30.0000 mL | Freq: Once | ORAL | Status: AC
  Administered 2015-10-21: 30 mL via ORAL
  Filled 2015-10-21: qty 30

## 2015-10-21 NOTE — ED Notes (Signed)
Throat slightly reddened. Pt speaks in a clear voice in complete sentences. Reports that she got a popcorn kernel stuck in her throat last night and now it is sore. She has taken no OTC meds for relief

## 2015-10-21 NOTE — Discharge Instructions (Signed)

## 2015-10-21 NOTE — ED Notes (Signed)
Pt states she ate some popcorn last night and feels like she got a kernel stuck in the back of her throat, now with increased soreness.

## 2015-10-21 NOTE — ED Provider Notes (Signed)
CSN: [id number]     Arrival date & time 10/21/15  0309 History   First MD Initiated Contact with Patient 10/21/15 [phone]     Chief Complaint  Patient presents with  . Sore Throat     (Consider location/radiation/quality/duration/timing/severity/associated sxs/prior Treatment) HPI  This is a 20 year old female who presents with sore throat. Patient states that approximately 8:30 PM she was eating popcorn. She felt like a kernel Stuck. She ate some bread and drink water and felt a little bit better. She denies any shortness of breath or cough. She went to bed but woke up with pain in her throat. She reports pain with swallowing but no difficulty swallowing. She is not taking anything for relief.  Reports her pain is 10 out of 10.  Past Medical History  Diagnosis Date  . Asthma    Past Surgical History  Procedure Laterality Date  . Tonsillectomy     Family History  Problem Relation Age of Onset  . Diabetes Mother   . Hypertension Mother    Social History  Substance Use Topics  . Smoking status: Never Smoker   . Smokeless tobacco: None  . Alcohol Use: No   OB History    No data available     Review of Systems  Constitutional: Negative for fever and chills.  HENT: Positive for sore throat.   Respiratory: Negative for cough, shortness of breath and stridor.   Cardiovascular: Negative for chest pain.  All other systems reviewed and are negative.     Allergies  Review of patient's allergies indicates no known allergies.  Home Medications   Prior to Admission medications   Medication Sig Start Date End Date Taking? Authorizing Provider  albuterol (PROVENTIL HFA;VENTOLIN HFA) 108 (90 BASE) MCG/ACT inhaler Inhale 2 puffs into the lungs every 6 (six) hours as needed for wheezing or shortness of breath. 10/31/13  Yes Mikey Kirschner, MD  drospirenone-ethinyl estradiol (YASMIN,ZARAH,SYEDA) 3-0.03 MG tablet Take 1 tablet by mouth daily. 01/15/14  Yes Nilda Simmer, NP   omeprazole (PRILOSEC) 40 MG capsule Take 1 capsule (40 mg total) by mouth daily. 05/20/14  Yes Nilda Simmer, NP  amoxicillin (AMOXIL) 500 MG tablet Take 1 tablet (500 mg total) by mouth 3 (three) times daily. 07/22/15   Kathyrn Drown, MD  traMADol (ULTRAM) 50 MG tablet Take 1 tablet (50 mg total) by mouth every 6 (six) hours as needed. Patient not taking: Reported on 07/22/2015 09/14/13   [name], MD   BP 123/91 mmHg  Pulse 105  Temp(Src) 99.4 F (37.4 C) (Oral)  Resp 18  Ht 5\' 4"  (1.626 m)  Wt 230 lb (104.327 kg)  BMI 39.46 kg/m2  SpO2 100%  LMP 09/18/2015 Physical Exam  Constitutional: She is oriented to person, place, and time. She appears well-developed and well-nourished.  Overweight  HENT:  Head: Normocephalic and atraumatic.  Mouth/Throat: Oropharynx is clear and moist. No oropharyngeal exudate.  Skin erythema, uvula midline  Neck: Neck supple.  Cardiovascular: Normal rate, regular rhythm and normal heart sounds.   Pulmonary/Chest: Effort normal and breath sounds normal. No stridor. No respiratory distress. She has no wheezes.  Neurological: She is alert and oriented to person, place, and time.  Skin: Skin is warm and dry.  Psychiatric: She has a normal mood and affect.  Nursing note and vitals reviewed.   ED Course  Procedures (including critical care time) Labs Review Labs Reviewed - No data to display  Imaging Review No results found. I  have personally reviewed and evaluated these images and lab results as part of my medical decision-making.   EKG Interpretation None      MDM   Final diagnoses:  Throat discomfort    Patient presents with sore throat. Feels she may have a popcorn kernel stuck in her throat. No obvious signs of distress. She is tolerating her secretions. No obvious obstruction. No stridor. She may have scratched her throat with the kernel. Discussed with patient x-rays versus observation and by mouth challenge. Patient would like  to defer x-rays at this time. Given that she does not appear obstructed, feel this is reasonable. Patient was given a GI cocktail with some relief of her pain. Patient discharged home. Given return precautions.  After history, exam, and medical workup I feel the patient has been appropriately medically screened and is safe for discharge home. Pertinent diagnoses were discussed with the patient. Patient was given return precautions.     Merryl Hacker, MD 10/21/15 [phone]

## 2015-10-21 NOTE — ED Notes (Signed)
Pt drinking water and eating saltines

## 2015-10-21 NOTE — ED Notes (Signed)
DCd per MD instruct. PT verbalizes understanding of DC instruct and followup with Dr Wolfgang Phoenix. Pt reports no questions

## 2015-11-21 ENCOUNTER — Encounter (HOSPITAL_COMMUNITY): Payer: Self-pay | Admitting: Emergency Medicine

## 2015-11-21 ENCOUNTER — Emergency Department (HOSPITAL_COMMUNITY)
Admission: EM | Admit: 2015-11-21 | Discharge: 2015-11-21 | Disposition: A | Discharge status: Home / Self Care | Payer: BLUE CROSS/BLUE SHIELD | Attending: Emergency Medicine | Admitting: Emergency Medicine

## 2015-11-21 DIAGNOSIS — K529 Noninfective gastroenteritis and colitis, unspecified: Secondary | ICD-10-CM

## 2015-11-21 DIAGNOSIS — J45909 Unspecified asthma, uncomplicated: Secondary | ICD-10-CM | POA: Insufficient documentation

## 2015-11-21 DIAGNOSIS — R112 Nausea with vomiting, unspecified: Secondary | ICD-10-CM | POA: Diagnosis present

## 2015-11-21 LAB — CBC
HCT: 33.8 % — ABNORMAL LOW (ref 36.0–46.0)
Hemoglobin: 10.8 g/dL — ABNORMAL LOW (ref 12.0–15.0)
MCH: 23 pg — ABNORMAL LOW (ref 26.0–34.0)
MCHC: 32 g/dL (ref 30.0–36.0)
MCV: 72.1 fL — ABNORMAL LOW (ref 78.0–100.0)
PLATELETS: 334 10*3/uL (ref 150–400)
RBC: 4.69 MIL/uL (ref 3.87–5.11)
RDW: 15.7 % — ABNORMAL HIGH (ref 11.5–15.5)
WBC: 9.6 10*3/uL (ref 4.0–10.5)

## 2015-11-21 LAB — COMPREHENSIVE METABOLIC PANEL
ALBUMIN: 4.1 g/dL (ref 3.5–5.0)
ALK PHOS: 71 U/L (ref 38–126)
ALT: 18 U/L (ref 14–54)
AST: 26 U/L (ref 15–41)
Anion gap: 6 (ref 5–15)
BILIRUBIN TOTAL: 1.1 mg/dL (ref 0.3–1.2)
BUN: 9 mg/dL (ref 6–20)
CALCIUM: 8.9 mg/dL (ref 8.9–10.3)
CO2: 27 mmol/L (ref 22–32)
CREATININE: 0.71 mg/dL (ref 0.44–1.00)
Chloride: 102 mmol/L (ref 101–111)
GFR calc Af Amer: >60 mL/min (ref >60)
GLUCOSE: 102 mg/dL — AB (ref 65–99)
POTASSIUM: 4 mmol/L (ref 3.5–5.1)
SODIUM: 135 mmol/L (ref 135–145)
Total Protein: 7.8 g/dL (ref 6.5–8.1)

## 2015-11-21 LAB — URINALYSIS, ROUTINE W REFLEX MICROSCOPIC
Bilirubin Urine: NEGATIVE
Glucose, UA: NEGATIVE mg/dL
Hgb urine dipstick: NEGATIVE
KETONES UR: NEGATIVE mg/dL
LEUKOCYTES UA: NEGATIVE
NITRITE: NEGATIVE
Protein, ur: NEGATIVE mg/dL
Specific Gravity, Urine: 1.025 (ref 1.005–1.030)
pH: 6 (ref 5.0–8.0)

## 2015-11-21 LAB — PREGNANCY, URINE: PREG TEST UR: NEGATIVE

## 2015-11-21 LAB — LIPASE, BLOOD: Lipase: 17 U/L (ref 11–51)

## 2015-11-21 MED ORDER — SODIUM CHLORIDE 0.9 % IV BOLUS (SEPSIS)
1000.0000 mL | Freq: Once | INTRAVENOUS | Status: AC
  Administered 2015-11-21: 1000 mL via INTRAVENOUS

## 2015-11-21 MED ORDER — ONDANSETRON 4 MG PO TBDP
4.0000 mg | ORAL_TABLET | Freq: Once | ORAL | Status: AC
  Administered 2015-11-21: 4 mg via ORAL
  Filled 2015-11-21: qty 1

## 2015-11-21 MED ORDER — ONDANSETRON 4 MG PO TBDP: 4.0000 mg | ORAL_TABLET | Freq: Three times a day (TID) | ORAL | Status: DC | PRN

## 2015-11-21 NOTE — ED Notes (Signed)
Pt states she has been having vomiting and diarrhea since last night.  Was sent home from work and states she needs a note to be out tonight.

## 2015-11-21 NOTE — ED Notes (Signed)
Patient verbalizes understanding of discharge instructions, prescriptions, home care and follow up care. Patient out of department at this time. 

## 2015-11-21 NOTE — ED Provider Notes (Signed)
CSN: [id number]     Arrival date & time 11/21/15  1304 History   First MD Initiated Contact with Patient 11/21/15 1506     Chief Complaint  Patient presents with  . Emesis  . Diarrhea     (Consider location/radiation/quality/duration/timing/severity/associated sxs/prior Treatment) HPI  20 year old female presents with vomiting and diarrhea. Last [place] she was at work she acutely felt nauseated and went vomited. Since then she's had multiple loose bowel movements. She's felt some nausea but no vomiting today. Her work told her she had to go see a doctor to get a doctor's note to return back to work. She has generalized abdominal pain shortly after eating the goes away whenever she has a bowel movement. There is no blood in her stool. No recent traveling, fevers, or antibiotics. She currently feels lightheaded.  Past Medical History  Diagnosis Date  . Asthma    Past Surgical History  Procedure Laterality Date  . Tonsillectomy     Family History  Problem Relation Age of Onset  . Diabetes Mother   . Hypertension Mother    Social History  Substance Use Topics  . Smoking status: Never Smoker   . Smokeless tobacco: None  . Alcohol Use: No   OB History    No data available     Review of Systems  Constitutional: Negative for fever.  Gastrointestinal: Positive for nausea, vomiting, abdominal pain and diarrhea. Negative for blood in stool.  Neurological: Positive for weakness and light-headedness.  All other systems reviewed and are negative.     Allergies  Review of patient's allergies indicates no known allergies.  Home Medications   Prior to Admission medications   Medication Sig Start Date End Date Taking? Authorizing Provider  albuterol (PROVENTIL HFA;VENTOLIN HFA) 108 (90 BASE) MCG/ACT inhaler Inhale 2 puffs into the lungs every 6 (six) hours as needed for wheezing or shortness of breath. 10/31/13   Mikey Kirschner, MD  amoxicillin (AMOXIL) 500 MG tablet Take 1 tablet  (500 mg total) by mouth 3 (three) times daily. 07/22/15   Kathyrn Drown, MD  drospirenone-ethinyl estradiol (YASMIN,ZARAH,SYEDA) 3-0.03 MG tablet Take 1 tablet by mouth daily. 01/15/14   Nilda Simmer, NP  omeprazole (PRILOSEC) 40 MG capsule Take 1 capsule (40 mg total) by mouth daily. 05/20/14   Nilda Simmer, NP  traMADol (ULTRAM) 50 MG tablet Take 1 tablet (50 mg total) by mouth every 6 (six) hours as needed. Patient not taking: Reported on 07/22/2015 09/14/13   [name], MD   BP 123/71 mmHg  Pulse 94  Temp(Src) 98 F (36.7 C) (Oral)  Resp 16  Ht 5\' 4"  (1.626 m)  Wt 230 lb (104.327 kg)  BMI 39.46 kg/m2  SpO2 100%  LMP 11/02/2015 Physical Exam  Constitutional: She is oriented to person, place, and time. She appears well-developed and well-nourished.  HENT:  Head: Normocephalic and atraumatic.  Right Ear: External ear normal.  Left Ear: External ear normal.  Nose: Nose normal.  Mouth/Throat: Mucous membranes are dry.  Eyes: Right eye exhibits no discharge. Left eye exhibits no discharge.  Cardiovascular: Normal rate, regular rhythm and normal heart sounds.   Pulmonary/Chest: Effort normal and breath sounds normal.  Abdominal: Soft. She exhibits no distension. There is no tenderness.  Neurological: She is alert and oriented to person, place, and time.  Skin: Skin is warm and dry.  Nursing note and vitals reviewed.   ED Course  Procedures (including critical care time) Labs Review Labs Reviewed  COMPREHENSIVE METABOLIC PANEL - Abnormal; Notable for the following:    Glucose, Bld 102 (*)    All other components within normal limits  CBC - Abnormal; Notable for the following:    Hemoglobin 10.8 (*)    HCT 33.8 (*)    MCV 72.1 (*)    MCH 23.0 (*)    RDW 15.7 (*)    All other components within normal limits  LIPASE, BLOOD  URINALYSIS, ROUTINE W REFLEX MICROSCOPIC (NOT AT [facility])  PREGNANCY, URINE    Imaging Review No results found. I have personally reviewed  and evaluated these images and lab results as part of my medical decision-making.   EKG Interpretation None      MDM   Final diagnoses:  Acute gastroenteritis    Overall patient appears well, is likely mildly dehydrated. Blood work and urine appear unremarkable. Abdominal exam shows no tenderness at all. Likely a viral gastroenteritis. Given IV fluids and will discharge with antibiotics and I discussed return precautions.  Sherwood Gambler, MD 11/21/15 1721

## 2016-02-24 ENCOUNTER — Encounter: Payer: Self-pay | Admitting: Family Medicine

## 2016-02-24 ENCOUNTER — Ambulatory Visit (INDEPENDENT_AMBULATORY_CARE_PROVIDER_SITE_OTHER): Payer: BLUE CROSS/BLUE SHIELD | Admitting: Family Medicine

## 2016-02-24 VITALS — BP 122/88 | Temp 98.3°F | Ht 64.0 in | Wt 252.0 lb

## 2016-02-24 DIAGNOSIS — B9689 Other specified bacterial agents as the cause of diseases classified elsewhere: Secondary | ICD-10-CM

## 2016-02-24 DIAGNOSIS — J019 Acute sinusitis, unspecified: Secondary | ICD-10-CM

## 2016-02-24 MED ORDER — BENZONATATE 100 MG PO CAPS: 100.0000 mg | ORAL_CAPSULE | Freq: Four times a day (QID) | ORAL | 0 refills | Status: DC | PRN

## 2016-02-24 MED ORDER — ALBUTEROL SULFATE HFA 108 (90 BASE) MCG/ACT IN AERS: 2.0000 | INHALATION_SPRAY | Freq: Four times a day (QID) | RESPIRATORY_TRACT | 0 refills | Status: DC | PRN

## 2016-02-24 MED ORDER — AMOXICILLIN-POT CLAVULANATE 875-125 MG PO TABS: 1.0000 | ORAL_TABLET | Freq: Two times a day (BID) | ORAL | 0 refills | Status: DC

## 2016-02-24 NOTE — Progress Notes (Signed)
   Subjective:    Patient ID: Stephanie Torres, female    DOB: [birth year], 20 y.o.   MRN: [id number]  Sinusitis  This is a new problem. Episode onset: 2 days. Associated symptoms include congestion, coughing, ear pain, headaches and a sore throat. (Wheezing) Treatments tried: allegra, benadryl.   Pos phlegm pos prod  Gunky   Left ear   Tried  Headache frontal in nature  Review of Systems  HENT: Positive for congestion, ear pain and sore throat.   Respiratory: Positive for cough.   Neurological: Positive for headaches.       Objective:   Physical Exam Alert, mild malaise. Hydration good Vitals stable. frontal/ maxillary tenderness evident positive nasal congestion. pharynx normal neck supple  lungs clear/no crackles or wheezes. heart regular in rhythm        Assessment & Plan:  Impression rhinosinusitis likely post viral, discussed with patient. plan antibiotics prescribed. Questions answered. Symptomatic care discussed. warning signs discussed. WSL

## 2016-02-28 ENCOUNTER — Encounter: Payer: Self-pay | Admitting: Family Medicine

## 2016-04-18 ENCOUNTER — Ambulatory Visit (INDEPENDENT_AMBULATORY_CARE_PROVIDER_SITE_OTHER): Payer: BLUE CROSS/BLUE SHIELD | Admitting: Family Medicine

## 2016-04-18 ENCOUNTER — Encounter: Payer: Self-pay | Admitting: Family Medicine

## 2016-04-18 VITALS — BP 118/82 | Temp 98.6°F | Ht 64.0 in | Wt 256.2 lb

## 2016-04-18 DIAGNOSIS — R062 Wheezing: Secondary | ICD-10-CM

## 2016-04-18 DIAGNOSIS — F411 Generalized anxiety disorder: Secondary | ICD-10-CM

## 2016-04-18 DIAGNOSIS — F320 Major depressive disorder, single episode, mild: Secondary | ICD-10-CM | POA: Diagnosis not present

## 2016-04-18 DIAGNOSIS — M940 Chondrocostal junction syndrome [Tietze]: Secondary | ICD-10-CM

## 2016-04-18 DIAGNOSIS — F32A Depression, unspecified: Secondary | ICD-10-CM | POA: Insufficient documentation

## 2016-04-18 DIAGNOSIS — F329 Major depressive disorder, single episode, unspecified: Secondary | ICD-10-CM | POA: Insufficient documentation

## 2016-04-18 MED ORDER — ETODOLAC 400 MG PO TABS: 400.0000 mg | ORAL_TABLET | Freq: Two times a day (BID) | ORAL | 0 refills | Status: DC

## 2016-04-18 MED ORDER — ESCITALOPRAM OXALATE 10 MG PO TABS: 10.0000 mg | ORAL_TABLET | Freq: Every day | ORAL | 2 refills | Status: DC

## 2016-04-18 NOTE — Progress Notes (Signed)
   Subjective:    Patient ID: Stephanie Torres, female    DOB: 19-Aug-1995, 20 y.o.   MRN: [id number]  Asthma  She complains of chest tightness, cough and wheezing. The current episode started today. Relieved by: inhaler. Her past medical history is significant for asthma.   Pt started with throat discomfort  Then started coughing  And dim energy   Used the ihaler srted coughin  And deep breath hurt   Hand and arm tingling  Foot was tingling and had troube breathing  With chest pain sharp   Sig sob and pain   And gets real irritable at time  Patient accompanied by mother. Very worried about tingling in both right hand and right foot and around the lips. Sharp pain anterior chest. Recalls no injury. Making the patient feels short of breath and using her inhaler frequently.  Increasing stress for the past year. Anxiety at times. Mood challenges intermittently. Diminished energy. Stress between work no suicidal thoughts   Review of Systems  Respiratory: Positive for cough and wheezing.   No headache no loss of consciousness     Objective:   Physical Exam Alert anxious appearing obesity present H&T normal lungs 0 wheezes right upper chest wall tender to palpation shoulder good range of motion hands feet sensation intact. Mood anxious somewhat depressed nonsuicidal non-homicidal       Assessment & Plan:  Impression 1 costochondritis #2 asthma actually clinically quite stable #3 paresthesias likely hyper ventilation discussed #4 generalized anxiety disorder with element of depression plan initiate Lexapro every morning. Anti-inflammatory medicine or chest pain. Albuterol for wheezing recheck in one month

## 2016-04-20 ENCOUNTER — Telehealth: Payer: Self-pay | Admitting: *Deleted

## 2016-04-20 MED ORDER — NAPROXEN 500 MG PO TABS: 500.0000 mg | ORAL_TABLET | Freq: Two times a day (BID) | ORAL | 1 refills | Status: DC

## 2016-04-20 NOTE — Telephone Encounter (Signed)
Spoke with patient and informed her per Dr.Scott Luking- Your insurance did not cover Etodolac so we changed it to Naprosyn since it is a preferred medication. Patient verbalized understanding.

## 2016-04-20 NOTE — Telephone Encounter (Signed)
Patient prescribed Etodolac for costochondritis but the medication is no preferred. Preferred medications are meloxicam and naproxen. Would you like to change to a preferred medication.

## 2016-04-20 NOTE — Addendum Note (Signed)
Addended by: Ofilia Neas R on: 04/20/2016 01:45 PM   Modules accepted: Orders

## 2016-04-20 NOTE — Telephone Encounter (Signed)
Naprosyn 500 mg 1 twice a day, #30, 1 refill

## 2016-10-18 ENCOUNTER — Emergency Department (HOSPITAL_COMMUNITY)
Admission: EM | Admit: 2016-10-18 | Discharge: 2016-10-18 | Disposition: A | Discharge status: Home / Self Care | Payer: BLUE CROSS/BLUE SHIELD | Attending: Emergency Medicine | Admitting: Emergency Medicine

## 2016-10-18 ENCOUNTER — Encounter (HOSPITAL_COMMUNITY): Payer: Self-pay | Admitting: Emergency Medicine

## 2016-10-18 DIAGNOSIS — Z79899 Other long term (current) drug therapy: Secondary | ICD-10-CM | POA: Insufficient documentation

## 2016-10-18 DIAGNOSIS — N760 Acute vaginitis: Secondary | ICD-10-CM | POA: Diagnosis not present

## 2016-10-18 DIAGNOSIS — J45909 Unspecified asthma, uncomplicated: Secondary | ICD-10-CM | POA: Insufficient documentation

## 2016-10-18 DIAGNOSIS — N939 Abnormal uterine and vaginal bleeding, unspecified: Secondary | ICD-10-CM | POA: Diagnosis present

## 2016-10-18 DIAGNOSIS — B9689 Other specified bacterial agents as the cause of diseases classified elsewhere: Secondary | ICD-10-CM

## 2016-10-18 DIAGNOSIS — N938 Other specified abnormal uterine and vaginal bleeding: Secondary | ICD-10-CM | POA: Diagnosis not present

## 2016-10-18 HISTORY — DX: Polycystic ovarian syndrome: E28.2

## 2016-10-18 HISTORY — DX: Other specified abnormal uterine and vaginal bleeding: N93.8

## 2016-10-18 LAB — URINALYSIS, ROUTINE W REFLEX MICROSCOPIC
BILIRUBIN URINE: NEGATIVE
Glucose, UA: NEGATIVE mg/dL
HGB URINE DIPSTICK: NEGATIVE
KETONES UR: NEGATIVE mg/dL
Leukocytes, UA: NEGATIVE
Nitrite: NEGATIVE
PROTEIN: NEGATIVE mg/dL
Specific Gravity, Urine: 1.027 (ref 1.005–1.030)
pH: 5 (ref 5.0–8.0)

## 2016-10-18 LAB — WET PREP, GENITAL
Sperm: NONE SEEN
Trich, Wet Prep: NONE SEEN
Yeast Wet Prep HPF POC: NONE SEEN

## 2016-10-18 LAB — PREGNANCY, URINE: PREG TEST UR: NEGATIVE

## 2016-10-18 MED ORDER — METRONIDAZOLE 500 MG PO TABS: 500.0000 mg | ORAL_TABLET | Freq: Two times a day (BID) | ORAL | 0 refills | Status: DC

## 2016-10-18 NOTE — Discharge Instructions (Signed)
Take the prescription as directed.  Call your regular OB/GYN doctor today to schedule a follow up appointment within the next week.  Return to the Emergency Department immediately sooner if worsening.

## 2016-10-18 NOTE — ED Triage Notes (Signed)
Pt states her last period was end of April. Started having abd cramps last night. Felt wet today and noticed brown blood with a big clot. Nad.

## 2016-10-18 NOTE — ED Provider Notes (Signed)
[facility] DEPT Provider Note   CSN: [id number] Arrival date & time: 10/18/16  1030     History   Chief Complaint Chief Complaint  Patient presents with  . Vaginal Bleeding    HPI Stephanie Torres is a 21 y.o. female.  HPI  Pt was seen at 1105. Per pt, c/o sudden onset and resolution of one episode of vaginal bleeding that occurred this morning. Pt states she had pelvic "cramping" last night and passed "brown blood with a clot" today. LMP the end of April. Denies dysuria/hematuria, no back pain, no N/V/D, no fevers.   Past Medical History:  Diagnosis Date  . Asthma   . DUB (dysfunctional uterine bleeding)   . PCOS (polycystic ovarian syndrome)     Patient Active Problem List   Diagnosis Date Noted  . Depression 04/18/2016  . Ingrown toenail 04/12/2013  . PCOS (polycystic ovarian syndrome) 04/11/2013  . Hyperinsulinemia 11/21/2012  . Iron deficiency anemia 11/21/2012  . Other and unspecified hyperlipidemia 11/21/2012  . Gastritis 10/31/2012  . Migraine headache without aura 10/31/2012  . Asthma with acute exacerbation 10/31/2012  . Morbid obesity ([place]) 10/31/2012    Past Surgical History:  Procedure Laterality Date  . TONSILLECTOMY      OB History    Gravida Para Term Preterm AB Living   1 1           SAB TAB Ectopic Multiple Live Births                   Home Medications    Prior to Admission medications   Medication Sig Start Date End Date Taking? Authorizing Provider  albuterol (PROVENTIL HFA;VENTOLIN HFA) 108 (90 Base) MCG/ACT inhaler Inhale 2 puffs into the lungs every 6 (six) hours as needed for wheezing or shortness of breath. 02/24/16  Yes Mikey Kirschner, MD  amoxicillin-clavulanate (AUGMENTIN) 875-125 MG tablet Take 1 tablet by mouth 2 (two) times daily. Patient not taking: Reported on 04/18/2016 02/24/16   Mikey Kirschner, MD  benzonatate (TESSALON) 100 MG capsule Take 1 capsule (100 mg total) by mouth every 6 (six) hours as needed for  cough. Patient not taking: Reported on 04/18/2016 02/24/16   Mikey Kirschner, MD  escitalopram (LEXAPRO) 10 MG tablet Take 1 tablet (10 mg total) by mouth daily. Patient not taking: Reported on 10/18/2016 04/18/16   Mikey Kirschner, MD  etodolac (LODINE) 400 MG tablet Take 1 tablet (400 mg total) by mouth 2 (two) times daily. With food as needed for pain Patient not taking: Reported on 10/18/2016 04/18/16   Mikey Kirschner, MD  naproxen (NAPROSYN) 500 MG tablet Take 1 tablet (500 mg total) by mouth 2 (two) times daily with a meal. Patient not taking: Reported on 10/18/2016 04/20/16   Kathyrn Drown, MD  ondansetron (ZOFRAN ODT) 4 MG disintegrating tablet Take 1 tablet (4 mg total) by mouth every 8 (eight) hours as needed for nausea or vomiting. Patient not taking: Reported on 04/18/2016 11/21/15   Sherwood Gambler, MD    Family History Family History  Problem Relation Age of Onset  . Diabetes Mother   . Hypertension Mother     Social History Social History  Substance Use Topics  . Smoking status: Never Smoker  . Smokeless tobacco: Never Used  . Alcohol use No     Allergies   Patient has no known allergies.   Review of Systems Review of Systems ROS: Statement: All systems negative except as marked or noted  in the HPI; Constitutional: Negative for fever and chills. ; ; Eyes: Negative for eye pain, redness and discharge. ; ; ENMT: Negative for ear pain, hoarseness, nasal congestion, sinus pressure and sore throat. ; ; Cardiovascular: Negative for chest pain, palpitations, diaphoresis, dyspnea and peripheral edema. ; ; Respiratory: Negative for cough, wheezing and stridor. ; ; Gastrointestinal: Negative for nausea, vomiting, diarrhea, abdominal pain, blood in stool, hematemesis, jaundice and rectal bleeding. . ; ; Genitourinary: Negative for dysuria, flank pain and hematuria. ; ; GYN:  No pelvic pain, +vaginal bleeding, no vaginal discharge, no vulvar pain. ;; Musculoskeletal: Negative  for back pain and neck pain. Negative for swelling and trauma.; ; Skin: Negative for pruritus, rash, abrasions, blisters, bruising and skin lesion.; ; Neuro: Negative for headache, lightheadedness and neck stiffness. Negative for weakness, altered level of consciousness, altered mental status, extremity weakness, paresthesias, involuntary movement, seizure and syncope.      Physical Exam Updated Vital Signs BP 123/68 (BP Location: Right Arm)   Pulse (!) 55   Temp 98.1 F (36.7 C)   Resp 17   Ht 5\' 4"  (1.626 m)   Wt 220 lb (99.8 kg)   LMP 10/18/2016   SpO2 98%   BMI 37.76 kg/m   Physical Exam 1110: Physical examination:  Nursing notes reviewed; Vital signs and O2 SAT reviewed;  Constitutional: Well developed, Well nourished, Well hydrated, In no acute distress; Head:  Normocephalic, atraumatic; Eyes: EOMI, PERRL, No scleral icterus; ENMT: Mouth and pharynx normal, Mucous membranes moist; Neck: Supple, Full range of motion, No lymphadenopathy; Cardiovascular: Regular rate and rhythm, No murmur, rub, or gallop; Respiratory: Breath sounds clear & equal bilaterally, No rales, rhonchi, wheezes.  Speaking full sentences with ease, Normal respiratory effort/excursion; Chest: Nontender, Movement normal; Abdomen: Soft, Nontender, Nondistended, Normal bowel sounds; Genitourinary: No CVA tenderness. Pelvic exam performed with permission of pt and female ED RN assist during exam.  External genitalia w/o lesions. Vaginal vault with thick white discharge, no blood in vault.  Cervix w/o lesions, not friable, no bleeding from os, GC/chlam and wet prep obtained and sent to lab.  Bimanual exam w/o CMT, uterine or adnexal tenderness.; Extremities: Pulses normal, No tenderness, No edema, No calf edema or asymmetry.; Neuro: AA&Ox3, Major CN grossly intact.  Speech clear. No gross focal motor or sensory deficits in extremities. Climbs on and off stretcher easily by herself. Gait steady.; Skin: Color normal, Warm,  Dry.   ED Treatments / Results  Labs (all labs ordered are listed, but only abnormal results are displayed)   EKG  EKG Interpretation None       Radiology   Procedures Procedures (including critical care time)  Medications Ordered in ED Medications - No data to display   Initial Impression / Assessment and Plan / ED Course  I have reviewed the triage vital signs and the nursing notes.  Pertinent labs & imaging results that were available during my care of the patient were reviewed by me and considered in my medical decision making (see chart for details).  MDM Reviewed: previous chart, nursing note and vitals Interpretation: labs   Results for orders placed or performed during the hospital encounter of 10/18/16  Wet prep, genital  Result Value Ref Range   Yeast Wet Prep HPF POC NONE SEEN NONE SEEN   Trich, Wet Prep NONE SEEN NONE SEEN   Clue Cells Wet Prep HPF POC PRESENT (A) NONE SEEN   WBC, Wet Prep HPF POC MANY (A) NONE SEEN  Sperm NONE SEEN   Pregnancy, urine  Result Value Ref Range   Preg Test, Ur NEGATIVE NEGATIVE  Urinalysis, Routine w reflex microscopic  Result Value Ref Range   Color, Urine YELLOW YELLOW   APPearance HAZY (A) CLEAR   Specific Gravity, Urine 1.027 1.005 - 1.030   pH 5.0 5.0 - 8.0   Glucose, UA NEGATIVE NEGATIVE mg/dL   Hgb urine dipstick NEGATIVE NEGATIVE   Bilirubin Urine NEGATIVE NEGATIVE   Ketones, ur NEGATIVE NEGATIVE mg/dL   Protein, ur NEGATIVE NEGATIVE mg/dL   Nitrite NEGATIVE NEGATIVE   Leukocytes, UA NEGATIVE NEGATIVE    1235:  Pregnancy test negative. Will tx for BV. Dx and testing d/w pt and family.  Questions answered.  Verb understanding, agreeable to d/c home with outpt f/u.    Final Clinical Impressions(s) / ED Diagnoses   Final diagnoses:  None    New Prescriptions New Prescriptions   No medications on file     Francine Graven, DO 10/24/16 9233

## 2016-10-19 LAB — GC/CHLAMYDIA PROBE AMP (~~LOC~~) NOT AT ARMC
Chlamydia: NEGATIVE
Neisseria Gonorrhea: NEGATIVE

## 2017-02-09 ENCOUNTER — Encounter (HOSPITAL_COMMUNITY): Payer: Self-pay | Admitting: Emergency Medicine

## 2017-02-09 ENCOUNTER — Emergency Department (HOSPITAL_COMMUNITY)
Admission: EM | Admit: 2017-02-09 | Discharge: 2017-02-10 | Disposition: A | Discharge status: Home / Self Care | Payer: BLUE CROSS/BLUE SHIELD | Attending: Emergency Medicine | Admitting: Emergency Medicine

## 2017-02-09 ENCOUNTER — Emergency Department (HOSPITAL_COMMUNITY): Payer: BLUE CROSS/BLUE SHIELD

## 2017-02-09 DIAGNOSIS — J029 Acute pharyngitis, unspecified: Secondary | ICD-10-CM

## 2017-02-09 DIAGNOSIS — J45909 Unspecified asthma, uncomplicated: Secondary | ICD-10-CM | POA: Diagnosis not present

## 2017-02-09 DIAGNOSIS — J069 Acute upper respiratory infection, unspecified: Secondary | ICD-10-CM | POA: Diagnosis not present

## 2017-02-09 DIAGNOSIS — R059 Cough, unspecified: Secondary | ICD-10-CM

## 2017-02-09 DIAGNOSIS — Z79899 Other long term (current) drug therapy: Secondary | ICD-10-CM | POA: Diagnosis not present

## 2017-02-09 DIAGNOSIS — R05 Cough: Secondary | ICD-10-CM | POA: Insufficient documentation

## 2017-02-09 NOTE — ED Triage Notes (Signed)
Pt c/o cough, congestion and sore throat x one week.

## 2017-02-10 DIAGNOSIS — R05 Cough: Secondary | ICD-10-CM | POA: Diagnosis not present

## 2017-02-10 LAB — RAPID STREP SCREEN (MED CTR MEBANE ONLY): Streptococcus, Group A Screen (Direct): NEGATIVE

## 2017-02-10 MED ORDER — DEXAMETHASONE SODIUM PHOSPHATE 4 MG/ML IJ SOLN
10.0000 mg | Freq: Once | INTRAMUSCULAR | Status: AC
  Administered 2017-02-10: 10 mg via INTRAMUSCULAR

## 2017-02-10 MED ORDER — IPRATROPIUM-ALBUTEROL 0.5-2.5 (3) MG/3ML IN SOLN
3.0000 mL | Freq: Once | RESPIRATORY_TRACT | Status: AC
  Administered 2017-02-10: 3 mL via RESPIRATORY_TRACT
  Filled 2017-02-10: qty 3

## 2017-02-10 MED ORDER — ACETAMINOPHEN 500 MG PO TABS
1000.0000 mg | ORAL_TABLET | Freq: Once | ORAL | Status: AC
  Administered 2017-02-10: 1000 mg via ORAL
  Filled 2017-02-10: qty 2

## 2017-02-10 MED ORDER — FLUTICASONE PROPIONATE 50 MCG/ACT NA SUSP: 1.0000 | Freq: Every day | NASAL | 2 refills | Status: DC

## 2017-02-10 MED ORDER — BENZONATATE 100 MG PO CAPS: 100.0000 mg | ORAL_CAPSULE | Freq: Three times a day (TID) | ORAL | 0 refills | Status: DC

## 2017-02-10 MED ORDER — ALBUTEROL SULFATE HFA 108 (90 BASE) MCG/ACT IN AERS: 2.0000 | INHALATION_SPRAY | Freq: Four times a day (QID) | RESPIRATORY_TRACT | 0 refills | Status: DC | PRN

## 2017-02-10 MED ORDER — PREDNISONE 10 MG PO TABS: 20.0000 mg | ORAL_TABLET | Freq: Every day | ORAL | 0 refills | Status: DC

## 2017-02-10 MED ORDER — DEXAMETHASONE SODIUM PHOSPHATE 4 MG/ML IJ SOLN
10.0000 mg | Freq: Once | INTRAMUSCULAR | Status: DC
Start: 2017-02-10 — End: 2017-02-10
  Filled 2017-02-10: qty 3

## 2017-02-10 NOTE — Discharge Instructions (Signed)
You can take Tylenol or Ibuprofen as directed for pain. You can alternate tylenol and ibuprofen every 4 hours. Take tylenol, wait 4 hours and take ibuprofen. Wait 4 hours and then take tylenol.   Use your albuterol inhaler as directed.   Use Tessalon perles as directed for cough.  Use Fluticasone as directed for nasal congestion.   Follow-up with her primary care doctor next 24-48 hours for further evaluation.  Return the emergency Department for any worsening fever, difficulty swallowing, drooling, facial swelling or redness, changes in her voice, chest pain, difficulty breathing or any other worsening or concerning symptoms.

## 2017-02-10 NOTE — ED Provider Notes (Signed)
[facility] DEPT Provider Note   CSN: [id number] Arrival date & time: 02/09/17  2223     History   Chief Complaint Chief Complaint  Patient presents with  . Cough    HPI Stephanie Torres is a 21 y.o. female with PMH/o asthma who presents with Sore throat, cough, congestion 1 week. Patient reports that symptoms have progressively worsened, prompting ED visit. She states that she took NyQuil for symptoms with no improvement. Patient reports that she has not taken any other medications for the sore throat. She reports that she is still able to swallow though swelling worsens her pain. She has not had any drooling or vomiting. Patient reports that her cough is dry and nonproductive. She does report that she has some difficulty breathing, chest soreness and chest tightness with coughing. No chest pain at rest. Patient reports that she has had a few episodes of posttussive emesis but denies any vomiting. Patient states that she is use her albuterol inhaler with minimal improvement. Patient denies any fever, chills, chest pain, SOB, abdominal pain, nausea, dysuria, hematuria. She denies any OCP use, recent immobilization, prior history of DVT/PE, recent surgery, leg swelling, or long travel.   The history is provided by the patient.    Past Medical History:  Diagnosis Date  . Asthma   . DUB (dysfunctional uterine bleeding)   . PCOS (polycystic ovarian syndrome)     Patient Active Problem List   Diagnosis Date Noted  . Depression 04/18/2016  . Ingrown toenail 04/12/2013  . PCOS (polycystic ovarian syndrome) 04/11/2013  . Hyperinsulinemia 11/21/2012  . Iron deficiency anemia 11/21/2012  . Other and unspecified hyperlipidemia 11/21/2012  . Gastritis 10/31/2012  . Migraine headache without aura 10/31/2012  . Asthma with acute exacerbation 10/31/2012  . Morbid obesity ([place]) 10/31/2012    Past Surgical History:  Procedure Laterality Date  . TONSILLECTOMY      OB History    Gravida Para Term Preterm AB Living   1 1           SAB TAB Ectopic Multiple Live Births                   Home Medications    Prior to Admission medications   Medication Sig Start Date End Date Taking? Authorizing Provider  albuterol (PROVENTIL HFA;VENTOLIN HFA) 108 (90 Base) MCG/ACT inhaler Inhale 2 puffs into the lungs every 6 (six) hours as needed for wheezing or shortness of breath. 02/10/17   Volanda Napoleon, PA-C  amoxicillin-clavulanate (AUGMENTIN) 875-125 MG tablet Take 1 tablet by mouth 2 (two) times daily. Patient not taking: Reported on 04/18/2016 02/24/16   Mikey Kirschner, MD  benzonatate (TESSALON) 100 MG capsule Take 1 capsule (100 mg total) by mouth every 8 (eight) hours. 02/10/17   Volanda Napoleon, PA-C  escitalopram (LEXAPRO) 10 MG tablet Take 1 tablet (10 mg total) by mouth daily. Patient not taking: Reported on 10/18/2016 04/18/16   Mikey Kirschner, MD  etodolac (LODINE) 400 MG tablet Take 1 tablet (400 mg total) by mouth 2 (two) times daily. With food as needed for pain Patient not taking: Reported on 10/18/2016 04/18/16   Mikey Kirschner, MD  fluticasone [facility]) 50 MCG/ACT nasal spray Place 1 spray into both nostrils daily. 02/10/17   Volanda Napoleon, PA-C  metroNIDAZOLE (FLAGYL) 500 MG tablet Take 1 tablet (500 mg total) by mouth 2 (two) times daily. 10/18/16   Francine Graven, DO  naproxen (NAPROSYN) 500 MG tablet Take  1 tablet (500 mg total) by mouth 2 (two) times daily with a meal. Patient not taking: Reported on 10/18/2016 04/20/16   Kathyrn Drown, MD  ondansetron (ZOFRAN ODT) 4 MG disintegrating tablet Take 1 tablet (4 mg total) by mouth every 8 (eight) hours as needed for nausea or vomiting. Patient not taking: Reported on 04/18/2016 11/21/15   Sherwood Gambler, MD  predniSONE (DELTASONE) 10 MG tablet Take 2 tablets (20 mg total) by mouth daily. 02/10/17   Volanda Napoleon, PA-C    Family History Family History  Problem Relation Age of Onset  .  Diabetes Mother   . Hypertension Mother     Social History Social History  Substance Use Topics  . Smoking status: Never Smoker  . Smokeless tobacco: Never Used  . Alcohol use No     Allergies   Patient has no known allergies.   Review of Systems Review of Systems  Constitutional: Negative for chills and fever.  HENT: Positive for congestion, rhinorrhea and sore throat. Negative for drooling and trouble swallowing.   Respiratory: Positive for cough and chest tightness. Negative for shortness of breath.   Cardiovascular: Negative for chest pain and leg swelling.  Gastrointestinal: Negative for abdominal pain, diarrhea, nausea and vomiting.  Genitourinary: Negative for dysuria and hematuria.     Physical Exam Updated Vital Signs BP 124/64   Pulse 79   Temp 98.3 F (36.8 C) (Oral)   Resp 16   Ht 5\' 4"  (1.626 m)   Wt 99.8 kg (220 lb)   LMP 01/11/2017   SpO2 99%   BMI 37.76 kg/m   Physical Exam  Constitutional: She is oriented to person, place, and time. She appears well-developed and well-nourished.  Sitting comfortably on examination table  HENT:  Head: Normocephalic and atraumatic.  Nose: Mucosal edema and rhinorrhea present.  Mouth/Throat: Uvula is midline and mucous membranes are normal. No trismus in the jaw. Posterior oropharyngeal edema and posterior oropharyngeal erythema present.  Posterior pharynx is edematous and erythematous. No evidence of exudate. No trismus. No facial or neck swelling. No evidence of peritonsillar abscess.  Eyes: Pupils are equal, round, and reactive to light. Conjunctivae, EOM and lids are normal.  Neck: Full passive range of motion without pain.  Cardiovascular: Normal rate, regular rhythm, normal heart sounds and normal pulses.  Exam reveals no gallop and no friction rub.   No murmur heard. Pulmonary/Chest: Effort normal and breath sounds normal. No accessory muscle usage. No respiratory distress. She has no wheezes. She has no  rales.  No evidence of respiratory distress. Able to speak in full sentences without difficulty. Tenderness to palpation to the midsternal chest. No deformities or crepitus. No flail chest.  Abdominal: Soft. Normal appearance. There is no tenderness. There is no rigidity and no guarding.  Musculoskeletal: Normal range of motion.  Bilateral lower extremities are symmetric in appearance.  Neurological: She is alert and oriented to person, place, and time.  Skin: Skin is warm and dry. Capillary refill takes less than 2 seconds.  Psychiatric: She has a normal mood and affect. Her speech is normal.  Nursing note and vitals reviewed.    ED Treatments / Results  Labs (all labs ordered are listed, but only abnormal results are displayed) Labs Reviewed  RAPID STREP SCREEN (NOT AT [facility])  CULTURE, GROUP A STREP [facility])    EKG  EKG Interpretation None       Radiology Dg Chest 2 View  Result Date: 02/09/2017 CLINICAL DATA:  cough,  congestion and sore throat, sob x one week. Stated cough is productive in the AM. No prior history of heart or lung conditions. History of asthma. EXAM: CHEST  2 VIEW COMPARISON:  10/30/2012 FINDINGS: The heart size and mediastinal contours are within normal limits. Both lungs are clear. The visualized skeletal structures are unremarkable. IMPRESSION: No active cardiopulmonary disease. Electronically Signed   By: Lucienne Capers M.D.   On: 02/09/2017 23:14    Procedures Procedures (including critical care time)  Medications Ordered in ED Medications  acetaminophen (TYLENOL) tablet 1,000 mg (1,000 mg Oral Given 02/10/17 0027)  ipratropium-albuterol (DUONEB) 0.5-2.5 (3) MG/3ML nebulizer solution 3 mL (3 mLs Nebulization Given 02/10/17 0036)  dexamethasone (DECADRON) injection 10 mg (10 mg Intramuscular Given 02/10/17 0027)     Initial Impression / Assessment and Plan / ED Course  I have reviewed the triage vital signs and the nursing notes.  Pertinent labs &  imaging results that were available during my care of the patient were reviewed by me and considered in my medical decision making (see chart for details).     21 year old female with past medical history of asthma who presents with cough, congestion, sore throat times one week. Also associated with some chest tightness, difficulty breathing, chest pain with coughing. None at rest. Also with posttussive emesis. Patient is afebrile, non-toxic appearing, sitting comfortably on examination table. Vital signs reviewed and stable. Patient's O2 sat is 100% on room air. No evidence of respiratory distress on physical exam. Consider acute infectious ideology versus URI versus pharyngitis asthma exacerbation. History/physical exam are not concerning for PE, cardiac etiology, Ludwig angina, peritonsillar abscess. Suspect component of costochondritis given history of chest soreness and tenderness palpation. Chest x-ray and rapid strep ordered at triage. Plan to give analgesics here in the department. Will give a nebulizer treatment in the department.  Chest x-ray is negative for any acute infectious etiology. Rapid strep is negative. Discussed results with patient. Patient feels somewhat improved after nebulizer treatment. Patient does not feel like she needs a second nebulizer treatment. Repeat lung exam shows lungs are clear to auscultation bilaterally. No evidence of wheezing. No evidence of respiratory distress. Plan to treat symptomatically. Patient instructed on conservative therapies at home. Instructed patient to follow-up with PCP in 2 days. Strict return precautions discussed. Patient expresses understanding and agreement to plan.    Final Clinical Impressions(s) / ED Diagnoses   Final diagnoses:  Viral pharyngitis  Upper respiratory tract infection, unspecified type  Cough    New Prescriptions New Prescriptions   ALBUTEROL (PROVENTIL HFA;VENTOLIN HFA) 108 (90 BASE) MCG/ACT INHALER    Inhale 2  puffs into the lungs every 6 (six) hours as needed for wheezing or shortness of breath.   BENZONATATE (TESSALON) 100 MG CAPSULE    Take 1 capsule (100 mg total) by mouth every 8 (eight) hours.   FLUTICASONE (FLONASE) 50 MCG/ACT NASAL SPRAY    Place 1 spray into both nostrils daily.   PREDNISONE (DELTASONE) 10 MG TABLET    Take 2 tablets (20 mg total) by mouth daily.     Volanda Napoleon, PA-C 02/10/17 0141    Ezequiel Essex, MD 02/10/17 [phone]

## 2017-02-12 LAB — CULTURE, GROUP A STREP (THRC)

## 2017-04-06 ENCOUNTER — Ambulatory Visit (INDEPENDENT_AMBULATORY_CARE_PROVIDER_SITE_OTHER): Payer: BLUE CROSS/BLUE SHIELD | Admitting: Nurse Practitioner

## 2017-04-06 ENCOUNTER — Encounter: Payer: Self-pay | Admitting: Nurse Practitioner

## 2017-04-06 VITALS — BP 120/82 | Ht 63.5 in | Wt 243.0 lb

## 2017-04-06 DIAGNOSIS — R21 Rash and other nonspecific skin eruption: Secondary | ICD-10-CM

## 2017-04-06 DIAGNOSIS — J45909 Unspecified asthma, uncomplicated: Secondary | ICD-10-CM

## 2017-04-06 DIAGNOSIS — Z124 Encounter for screening for malignant neoplasm of cervix: Secondary | ICD-10-CM

## 2017-04-06 DIAGNOSIS — F329 Major depressive disorder, single episode, unspecified: Secondary | ICD-10-CM | POA: Diagnosis not present

## 2017-04-06 DIAGNOSIS — Z30019 Encounter for initial prescription of contraceptives, unspecified: Secondary | ICD-10-CM

## 2017-04-06 DIAGNOSIS — Z23 Encounter for immunization: Secondary | ICD-10-CM

## 2017-04-06 DIAGNOSIS — F419 Anxiety disorder, unspecified: Secondary | ICD-10-CM

## 2017-04-06 DIAGNOSIS — Z0000 Encounter for general adult medical examination without abnormal findings: Secondary | ICD-10-CM | POA: Diagnosis not present

## 2017-04-06 LAB — POCT URINE PREGNANCY: PREG TEST UR: NEGATIVE

## 2017-04-06 MED ORDER — CLONAZEPAM 0.5 MG PO TABS: 0.5000 mg | ORAL_TABLET | Freq: Every day | ORAL | 0 refills | Status: DC

## 2017-04-06 MED ORDER — TRIAMCINOLONE ACETONIDE 0.1 % EX CREA: 1.0000 "application " | TOPICAL_CREAM | Freq: Two times a day (BID) | CUTANEOUS | 0 refills | Status: DC

## 2017-04-06 MED ORDER — MEDROXYPROGESTERONE ACETATE 150 MG/ML IM SUSP
150.0000 mg | Freq: Once | INTRAMUSCULAR | Status: AC
  Administered 2017-04-06: 150 mg via INTRAMUSCULAR

## 2017-04-06 MED ORDER — SERTRALINE HCL 50 MG PO TABS: ORAL_TABLET | ORAL | 0 refills | Status: DC

## 2017-04-06 MED ORDER — FLUTICASONE PROPIONATE HFA 110 MCG/ACT IN AERO: 1.0000 | INHALATION_SPRAY | Freq: Two times a day (BID) | RESPIRATORY_TRACT | 5 refills | Status: DC

## 2017-04-06 NOTE — Progress Notes (Signed)
Subjective:    Patient ID: Stephanie Torres, female    DOB: [birth year], 21 y.o.   MRN: [id number]  HPI: 21 y/o female presents for annual well woman exam.Pt states she has not had a vision exam since 2014. Pt reports no dental exam since 2016. She reports a fairly balanced diet and an interest in losing weight. She states she walks frequently with her job, but does not exercise regularly.  Pt reports a depressed mood with anxiety, agitation, sleep disturbances, obsessive thoughts, tearfulness, decrease energy, and little interest or pleasure in doing things for appx 2 years. Pt states symptoms first began while pregnant with her daughter when her father attempted suicide. Pt states symptoms progressively worsened after the birth of her child, but she did not report them due to fear of having her daughter taken away. Pt states she has constant worry and "obessing" over her daughters well-being and does not want her out of her sight. She states that she does not sleep for fear the "baby will die in her sleep" and reports her parents moved the child's bed into their room in so she would stop obsessing. Pt reports she works at [employer] and enjoys her position, but states that she is constantly concerned about her daughter while she is at work. Pt reports two moves in the past 3 months and recently moving out of her parents home into a home with her boyfriend and their child. Pt reports taking lexapro in the past, but did not like the way the medication feels. She stated she has thought about hurting herself in the past, but did not act upon it or have a plan. She reports she knows she needs help for her depression and anxiety and has been encouraged by her family to seek help. Pt reports regular menstrual periods with normal flow lasting appx 5 days. LMP 03/29/2017. She is sexually active with the same sexual partner for the past 5 years. She denies vaginal discharge, odor, or lesions. She denies current use of  birth control but requests to discuss birth control options today.  Pt reports erythema and itching to bilateral inner thighs she feels is d/t her thighs rubbing together when she walks starting about 4 months ago. She has tried baby powder, antibiotic cream, and benadryl cream with no relief. She states nothing seems to make the symptoms worse. She denies a change in laundry detergents, soaps, or lotions.  Pt reports an increase in asthma symptoms since September. She reports using her rescue inhaler 1-2 times per day for wheezing and shortness of breath. She also reports sinus pain/pressure, post nasal drip, non-productive cough, and rhinorrhea. She states this occurs yearly during the fall and spring with season changes. She has not taken anything for her symptoms other than her rescue inhaler. Nothing seems to make her symptoms worse. She denies fever.   Review of Systems  Constitutional: Positive for fatigue. Negative for activity change, appetite change, fever and unexpected weight change.  HENT: Positive for postnasal drip, rhinorrhea, sinus pain, sinus pressure and sore throat. Negative for ear pain and nosebleeds.   Respiratory: Positive for cough, shortness of breath and wheezing. Negative for chest tightness.   Cardiovascular: Negative for chest pain, palpitations and leg swelling.  Gastrointestinal: Negative for abdominal distention, blood in stool, constipation and diarrhea.  Genitourinary: Negative for frequency, menstrual problem, urgency, vaginal bleeding, vaginal discharge and vaginal pain.  Skin: Positive for rash.       Bilateral inner thigh  Neurological: Positive for headaches. Negative for weakness.  Psychiatric/Behavioral: Positive for agitation, decreased concentration, dysphoric mood and sleep disturbance. The patient is nervous/anxious.    Depression screen [facility] 2/9 04/06/2017 04/06/2017  Decreased Interest 3 3  Down, Depressed, Hopeless 3 3  PHQ - 2 Score 6 6  Altered  sleeping 2 2  Tired, decreased energy 3 3  Change in appetite 2 2  Feeling bad or failure about yourself  2 2  Trouble concentrating 0 0  Moving slowly or fidgety/restless 1 1  Suicidal thoughts 1 1  PHQ-9 Score 17 17  Difficult doing work/chores Very difficult -   GAD 7 : Generalized Anxiety Score 04/06/2017  Nervous, Anxious, on Edge 2  Control/stop worrying 3  Worry too much - different things 3  Trouble relaxing 3  Restless 2  Easily annoyed or irritable 3  Afraid - awful might happen 3  Total GAD 7 Score 19  Anxiety Difficulty Very difficult      Objective:   Physical Exam  Constitutional: She is oriented to person, place, and time. Vital signs are normal. She appears well-developed. She is cooperative. No distress.  HENT:  Mouth/Throat: Oropharynx is clear and moist. No oropharyngeal exudate.  Eyes: Pupils are equal, round, and reactive to light.  Neck: Normal range of motion. Neck supple. No tracheal deviation present. No thyromegaly present.  Cardiovascular: Normal rate, regular rhythm and normal heart sounds.  Exam reveals no gallop.   No murmur heard. Pulmonary/Chest: Effort normal and breath sounds normal. No respiratory distress. She has no wheezes.  Breasts equal in size and shape bilaterally, no deformity or skin changes noted on inspection. No masses, tenderness, nodularity, edema, or nipple discharge noted on palpation bilaterally. No axillary adenopathy noted bilaterally.  Abdominal: Soft. Bowel sounds are normal. She exhibits no distension. There is no tenderness.  Genitourinary: Vagina normal and uterus normal. No vaginal discharge found.  Musculoskeletal: Normal range of motion. She exhibits no edema.  Lymphadenopathy:    She has no cervical adenopathy.  Neurological: She is alert and oriented to person, place, and time.  Skin: Skin is warm and dry. No rash noted.  Faint erythematous well defined plaque with lichenification to medial aspects of bilateral  upper thigh. No edema or open areas of skin noted.   Psychiatric: Her speech is normal and behavior is normal. Her mood appears anxious. She exhibits a depressed mood.  Tearful during interview.  Vitals reviewed.  Results for orders placed or performed in visit on 04/06/17  POCT urine pregnancy  Result Value Ref Range   Preg Test, Ur Negative Negative    Vitals:   04/06/17 0917  BP: 120/82  Weight: 243 lb (110.2 kg)  Height: 5' 3.5" (1.613 m)      Assessment & Plan:  .1. Annual physical exam - Pap IG (Image Guided) - Encouraged patient to continue working on diet and exercise - Encouraged annual vision and bi-annual dental exams  2. Anxiety and depression - Ambulatory referral to Psychology - Sertraline started  - Pt educated on worsening depression symptoms and to go to ED if she experiences any thoughts of suicide or self harm - Clonazepam started for sleep - pt educated on taking this only when needed - Pt provided with information on depression and anxiety - Praised pt for seeking help and encouraged her to continue with treatment even if she begins to feel better  - Return in 1 month for F/U  3. Encounter for female birth control -  POCT urine pregnancy - medroxyPROGESTERone (DEPO-PROVERA) injection 150 mg; Inject 1 mL (150 mg total) into the muscle once. - Encouraged pt to use back-up protection for appx one week to prevent pregnancy  4. Rash, skin - Trimacinilone cream ordered - Return if symptoms worsen or do not improve when dosage complete  5. Moderate asthma without complication, unspecified whether persistent - Continue albuterol as needed for rescue with asthma symptoms - Start fluticasone HFA daily as a maintenance inhaler for asthma symptoms during allergy seasons  6. Need for influenza vaccination - Flu Vaccine QUAD 6+ mos PF IM (Fluarix Quad PF)  7. Screening for cervical cancer - Pap IG (Image Guided)  Meds ordered this encounter  Medications  .  clonazePAM (KLONOPIN) 0.5 MG tablet    Sig: Take 1 tablet (0.5 mg total) by mouth at bedtime. prn    Dispense:  30 tablet    Refill:  0    Order Specific Question:   Supervising Provider    Answer:   Mikey Kirschner [[id number]]  . medroxyPROGESTERone (DEPO-PROVERA) injection 150 mg  . fluticasone (FLOVENT HFA) 110 MCG/ACT inhaler    Sig: Inhale 1 puff into the lungs 2 (two) times daily. Rinse mouth after each use    Dispense:  1 Inhaler    Refill:  5    Order Specific Question:   Supervising Provider    Answer:   Mikey Kirschner [[id number]]  . sertraline (ZOLOFT) 50 MG tablet    Sig: Take 1/2 tab PO qd for first 6 days- Then 1 tab PO qd    Dispense:  30 tablet    Refill:  0    Order Specific Question:   Supervising Provider    Answer:   Mikey Kirschner [[id number]]  . triamcinolone cream (KENALOG) 0.1 %    Sig: Apply 1 application topically 2 (two) times daily. Prn rash; use up to 2 weeks    Dispense:  30 g    Refill:  0    Order Specific Question:   Supervising Provider    Answer:   Mikey Kirschner [[id number]]   Orders Placed This Encounter  Procedures  . Flu Vaccine QUAD 6+ mos PF IM (Fluarix Quad PF)  . Ambulatory referral to Psychology    Referral Priority:   Routine    Referral Type:   Psychiatric    Referral Reason:   Specialty Services Required    Requested Specialty:   Psychology    Number of Visits Requested:   1  . POCT urine pregnancy   Return in about 1 month (around 05/06/2017) for recheck. Call back sooner if any problems.

## 2017-04-06 NOTE — Patient Instructions (Addendum)
Zoloft 50mg  tab has been ordered- take 1/2 tablet for 6 days then take a whole tablet from them on. You may want to take this in the morning because it can give you some energy. Call the office if you cannot tolerate this medication or start having any concerning side effects.   If you feel like you want to harm yourself please go to the emergency room immediately.  Klonopin has also been ordered. You will take one tablet as needed at bedtime to help with anxiety and help you rest.     Generalized Anxiety Disorder, Adult Generalized anxiety disorder (GAD) is a mental health disorder. People with this condition constantly worry about everyday events. Unlike normal anxiety, worry related to GAD is not triggered by a specific event. These worries also do not fade or get better with time. GAD interferes with life functions, including relationships, work, and school. GAD can vary from mild to severe. People with severe GAD can have intense waves of anxiety with physical symptoms (panic attacks). What are the causes? The exact cause of GAD is not known. What increases the risk? This condition is more likely to develop in:  Women.  People who have a family history of anxiety disorders.  People who are very shy.  People who experience very stressful life events, such as the death of a loved one.  People who have a very stressful family environment.  What are the signs or symptoms? People with GAD often worry excessively about many things in their lives, such as their health and family. They may also be overly concerned about:  Doing well at work.  Being on time.  Natural disasters.  Friendships.  Physical symptoms of GAD include:  Fatigue.  Muscle tension or having muscle twitches.  Trembling or feeling shaky.  Being easily startled.  Feeling like your heart is pounding or racing.  Feeling out of breath or like you cannot take a deep breath.  Having trouble falling asleep  or staying asleep.  Sweating.  Nausea, diarrhea, or irritable bowel syndrome (IBS).  Headaches.  Trouble concentrating or remembering facts.  Restlessness.  Irritability.  How is this diagnosed? Your health care provider can diagnose GAD based on your symptoms and medical history. You will also have a physical exam. The health care provider will ask specific questions about your symptoms, including how severe they are, when they started, and if they come and go. Your health care provider may ask you about your use of alcohol or drugs, including prescription medicines. Your health care provider may refer you to a mental health specialist for further evaluation. Your health care provider will do a thorough examination and may perform additional tests to rule out other possible causes of your symptoms. To be diagnosed with GAD, a person must have anxiety that:  Is out of his or her control.  Affects several different aspects of his or her life, such as work and relationships.  Causes distress that makes him or her unable to take part in normal activities.  Includes at least three physical symptoms of GAD, such as restlessness, fatigue, trouble concentrating, irritability, muscle tension, or sleep problems.  Before your health care provider can confirm a diagnosis of GAD, these symptoms must be present more days than they are not, and they must last for six months or longer. How is this treated? The following therapies are usually used to treat GAD:  Medicine. Antidepressant medicine is usually prescribed for long-term daily control. Antianxiety medicines  may be added in severe cases, especially when panic attacks occur.  Talk therapy (psychotherapy). Certain types of talk therapy can be helpful in treating GAD by providing support, education, and guidance. Options include: ? Cognitive behavioral therapy (CBT). People learn coping skills and techniques to ease their anxiety. They learn  to identify unrealistic or negative thoughts and behaviors and to replace them with positive ones. ? Acceptance and commitment therapy (ACT). This treatment teaches people how to be mindful as a way to cope with unwanted thoughts and feelings. ? Biofeedback. This process trains you to manage your body's response (physiological response) through breathing techniques and relaxation methods. You will work with a therapist while machines are used to monitor your physical symptoms.  Stress management techniques. These include yoga, meditation, and exercise.  A mental health specialist can help determine which treatment is best for you. Some people see improvement with one type of therapy. However, other people require a combination of therapies. Follow these instructions at home:  Take over-the-counter and prescription medicines only as told by your health care provider.  Try to maintain a normal routine.  Try to anticipate stressful situations and allow extra time to manage them.  Practice any stress management or self-calming techniques as taught by your health care provider.  Do not punish yourself for setbacks or for not making progress.  Try to recognize your accomplishments, even if they are small.  Keep all follow-up visits as told by your health care provider. This is important. Contact a health care provider if:  Your symptoms do not get better.  Your symptoms get worse.  You have signs of depression, such as: ? A persistently sad, cranky, or irritable mood. ? Loss of enjoyment in activities that used to bring you joy. ? Change in weight or eating. ? Changes in sleeping habits. ? Avoiding friends or family members. ? Loss of energy for normal tasks. ? Feelings of guilt or worthlessness. Get help right away if:  You have serious thoughts about hurting yourself or others. If you ever feel like you may hurt yourself or others, or have thoughts about taking your own life, get  help right away. You can go to your nearest emergency department or call:  Your local emergency services (911 in the [place]  A suicide crisis helpline, such as the [facility] at 713 731 6115. This is open 24 hours a day.  Summary  Generalized anxiety disorder (GAD) is a mental health disorder that involves worry that is not triggered by a specific event.  People with GAD often worry excessively about many things in their lives, such as their health and family.  GAD may cause physical symptoms such as restlessness, trouble concentrating, sleep problems, frequent sweating, nausea, diarrhea, headaches, and trembling or muscle twitching.  A mental health specialist can help determine which treatment is best for you. Some people see improvement with one type of therapy. However, other people require a combination of therapies. This information is not intended to replace advice given to you by your health care provider. Make sure you discuss any questions you have with your health care provider. Document Released: 09/16/2012 Document Revised: 04/11/2016 Document Reviewed: 04/11/2016 Elsevier Interactive Patient Education  2018 [employer]. Persistent Depressive Disorder, Adult Persistent depressive disorder (PDD) is a mental health condition that causes symptoms of low-level depression for 2 years or longer. It may also be called long-term (chronic) depression or dysthymia. PDD may include episodes of more severe depression that last for  about 2 weeks (major depressive disorder or MDD). PDD can affect the way you think, feel, and sleep. This condition may also affect your relationships. You may be more likely to get sick if you have PDD. What are the causes? The exact cause of this condition is not known. PDD is most likely caused by a combination of things, which may include:  Genetic factors. These are traits that are passed along from parent to child.  Individual  factors. Your personality, your behavior, and the way you handle your thoughts and feelings may contribute to PDD. This includes personality traits and behaviors learned from others.  Physical factors, such as: ? Differences in the part of your brain that controls emotion. This part of your brain may be different than it is in people who do not have PDD. ? Long-term (chronic) medical or psychiatric illnesses.  Social factors. Traumatic experiences or major life changes may play a role in the development of PDD.  What increases the risk? This condition is more likely to develop in women. The following factors may make you more likely to develop PDD:  A family history of depression.  Abnormally low levels of certain brain chemicals.  Traumatic events in childhood, especially abuse or the loss of a parent.  Being under a lot of stress, or long-term stress, especially from upsetting life experiences or losses.  A history of: ? Chronic physical illness. ? Other mental health disorders. ? Substance abuse.  Poor living conditions.  Experiencing social exclusion or discrimination on a regular basis.  What are the signs or symptoms? Symptoms of this condition occur for most of the day, and may include:  Fatigue or low energy.  Eating too much or too little.  Sleeping too much or too little.  Restlessness or agitation.  Feelings of hopelessness.  Feeling worthless or guilty.  Anxiety.  Poor concentration or difficulty making decisions.  Low self-esteem.  Negative outlook.  Inability to have fun or experience pleasure.  Social withdrawal.  Unexplained physical complaints.  Irritability.  Aggressive behavior or anger.  How is this diagnosed? This condition may be diagnosed based on:  Your symptoms.  Your medical history, including your mental health history. This may involve tests to evaluate your mental health. You may be asked questions about your lifestyle,  including any drug and alcohol use, and how long you have had symptoms of PDD.  A physical exam.  Blood tests to rule out other conditions.  You may be diagnosed with PDD if you have had a depressed mood for 2 years or longer, as well as other symptoms of depression. How is this treated? This condition is usually treated by mental health professionals, such as psychologists, psychiatrists, and clinical social workers. You may need more than one type of treatment. Treatment may include:  Psychotherapy. This is also called talk therapy or counseling. Types of psychotherapy include: ? Cognitive behavioral therapy (CBT). This type of therapy teaches you to recognize unhealthy feelings, thoughts, and behaviors, and replace them with positive thoughts and actions. ? Interpersonal therapy (IPT). This helps you to improve the way you relate to and communicate with others. ? Family therapy. This treatment includes members of your family.  Medicine to treat anxiety and depression, or to help you control certain emotions and behaviors.  Lifestyle changes, such as: ? Limiting alcohol and drug use. ? Exercising regularly. ? Getting plenty of sleep. ? Making healthy eating choices. ? Spending more time outdoors.  Follow these instructions at  home: Activity  Return to your normal activities as told by your health care provider.  Exercise regularly and spend time outdoors as told by your health care provider. General instructions  Take over-the-counter and prescription medicines only as told by your health care provider.  Do not drink alcohol. If you drink alcohol, limit your alcohol intake to no more than 1 drink a day for nonpregnant women and 2 drinks a day for men. One drink equals 12 oz of beer, 5 oz of wine, or 1 oz of hard liquor. Alcohol can affect any antidepressant medicines you are taking. Talk to your health care provider about your alcohol use.  Eat a healthy diet and get plenty of  sleep.  Find activities that you enjoy doing, and make time to do them.  Consider joining a support group. Your health care provider may be able to recommend a support group.  Keep all follow-up visits as told by your health care provider. This is important. Where to find more information: [employer] on Mental Illness  www.nami.org  [place] National Institute of Mental Health  https://carter.com/  National Suicide Prevention Lifeline  1-800-273-TALK 5874683365). This is free, 24-hour help.  Contact a health care provider if:  Your symptoms get worse.  You develop new symptoms.  You have trouble sleeping or doing your daily activities. Get help right away if:  You self-harm.  You have serious thoughts about hurting yourself or others.  You see, hear, taste, smell, or feel things that are not present (hallucinate). This information is not intended to replace advice given to you by your health care provider. Make sure you discuss any questions you have with your health care provider. Document Released: 05/08/2012 Document Revised: 01/20/2016 Document Reviewed: 12/04/2015 Elsevier Interactive Patient Education  2017 [place] nasal spray in each nostril every day to help with sinus/allergy symptoms. Follow the direction on the packaging.

## 2017-04-09 ENCOUNTER — Encounter: Payer: Self-pay | Admitting: Nurse Practitioner

## 2017-04-10 ENCOUNTER — Encounter: Payer: Self-pay | Admitting: Family Medicine

## 2017-04-10 LAB — PAP IG (IMAGE GUIDED): PAP Smear Comment: 0

## 2017-04-16 ENCOUNTER — Emergency Department (HOSPITAL_COMMUNITY)
Admission: EM | Admit: 2017-04-16 | Discharge: 2017-04-17 | Disposition: A | Discharge status: Other Acute Inpatient Hospital | Payer: BLUE CROSS/BLUE SHIELD | Attending: Emergency Medicine | Admitting: Emergency Medicine

## 2017-04-16 ENCOUNTER — Telehealth: Payer: Self-pay | Admitting: Nurse Practitioner

## 2017-04-16 ENCOUNTER — Other Ambulatory Visit: Payer: Self-pay

## 2017-04-16 ENCOUNTER — Encounter (HOSPITAL_COMMUNITY): Payer: Self-pay | Admitting: Emergency Medicine

## 2017-04-16 DIAGNOSIS — R45851 Suicidal ideations: Secondary | ICD-10-CM | POA: Diagnosis not present

## 2017-04-16 DIAGNOSIS — Z046 Encounter for general psychiatric examination, requested by authority: Secondary | ICD-10-CM | POA: Diagnosis not present

## 2017-04-16 DIAGNOSIS — T43202A Poisoning by unspecified antidepressants, intentional self-harm, initial encounter: Secondary | ICD-10-CM | POA: Diagnosis not present

## 2017-04-16 DIAGNOSIS — F23 Brief psychotic disorder: Secondary | ICD-10-CM | POA: Diagnosis not present

## 2017-04-16 DIAGNOSIS — F329 Major depressive disorder, single episode, unspecified: Secondary | ICD-10-CM | POA: Insufficient documentation

## 2017-04-16 DIAGNOSIS — Z79899 Other long term (current) drug therapy: Secondary | ICD-10-CM | POA: Diagnosis not present

## 2017-04-16 DIAGNOSIS — J45909 Unspecified asthma, uncomplicated: Secondary | ICD-10-CM | POA: Diagnosis not present

## 2017-04-16 DIAGNOSIS — T50902A Poisoning by unspecified drugs, medicaments and biological substances, intentional self-harm, initial encounter: Secondary | ICD-10-CM

## 2017-04-16 HISTORY — DX: Depression, unspecified: F32.A

## 2017-04-16 HISTORY — DX: Major depressive disorder, single episode, unspecified: F32.9

## 2017-04-16 LAB — CBC WITH DIFFERENTIAL/PLATELET
Basophils Absolute: 0.1 10*3/uL (ref 0.0–0.1)
Basophils Relative: 1 %
EOS ABS: 0.4 10*3/uL (ref 0.0–0.7)
EOS PCT: 4 %
HCT: 33.6 % — ABNORMAL LOW (ref 36.0–46.0)
HEMOGLOBIN: 10.5 g/dL — AB (ref 12.0–15.0)
LYMPHS PCT: 30 %
Lymphs Abs: 3 10*3/uL (ref 0.7–4.0)
MCH: 21.4 pg — AB (ref 26.0–34.0)
MCHC: 31.3 g/dL (ref 30.0–36.0)
MCV: 68.4 fL — ABNORMAL LOW (ref 78.0–100.0)
MONO ABS: 0.6 10*3/uL (ref 0.1–1.0)
Monocytes Relative: 6 %
NEUTROS PCT: 59 %
Neutro Abs: 6 10*3/uL (ref 1.7–7.7)
Platelets: 394 10*3/uL (ref 150–400)
RBC: 4.91 MIL/uL (ref 3.87–5.11)
RDW: 17 % — ABNORMAL HIGH (ref 11.5–15.5)
WBC: 10.1 10*3/uL (ref 4.0–10.5)

## 2017-04-16 LAB — I-STAT BETA HCG BLOOD, ED (MC, WL, AP ONLY): I-stat hCG, quantitative: <5 m[IU]/mL (ref <5)

## 2017-04-16 LAB — RAPID URINE DRUG SCREEN, HOSP PERFORMED
Amphetamines: NOT DETECTED
BARBITURATES: NOT DETECTED
Benzodiazepines: NOT DETECTED
Cocaine: NOT DETECTED
Opiates: NOT DETECTED
Tetrahydrocannabinol: NOT DETECTED

## 2017-04-16 LAB — COMPREHENSIVE METABOLIC PANEL
ALBUMIN: 3.7 g/dL (ref 3.5–5.0)
ALK PHOS: 67 U/L (ref 38–126)
ALT: 13 U/L — AB (ref 14–54)
AST: 17 U/L (ref 15–41)
Anion gap: 5 (ref 5–15)
BUN: 10 mg/dL (ref 6–20)
CALCIUM: 8.8 mg/dL — AB (ref 8.9–10.3)
CO2: 26 mmol/L (ref 22–32)
CREATININE: 0.63 mg/dL (ref 0.44–1.00)
Chloride: 107 mmol/L (ref 101–111)
GFR calc Af Amer: >60 mL/min (ref >60)
GFR calc non Af Amer: >60 mL/min (ref >60)
GLUCOSE: 107 mg/dL — AB (ref 65–99)
Potassium: 3.6 mmol/L (ref 3.5–5.1)
SODIUM: 138 mmol/L (ref 135–145)
Total Bilirubin: 0.6 mg/dL (ref 0.3–1.2)
Total Protein: 7.2 g/dL (ref 6.5–8.1)

## 2017-04-16 LAB — URINALYSIS, ROUTINE W REFLEX MICROSCOPIC
BILIRUBIN URINE: NEGATIVE
GLUCOSE, UA: NEGATIVE mg/dL
Hgb urine dipstick: NEGATIVE
KETONES UR: NEGATIVE mg/dL
Nitrite: NEGATIVE
Protein, ur: 30 mg/dL — AB
Specific Gravity, Urine: 1.017 (ref 1.005–1.030)
pH: 7 (ref 5.0–8.0)

## 2017-04-16 LAB — ETHANOL: Alcohol, Ethyl (B): <10 mg/dL (ref <10)

## 2017-04-16 LAB — ACETAMINOPHEN LEVEL: Acetaminophen (Tylenol), Serum: <10 ug/mL — ABNORMAL LOW (ref 10–30)

## 2017-04-16 LAB — SALICYLATE LEVEL: Salicylate Lvl: <7 mg/dL (ref 2.8–30.0)

## 2017-04-16 MED ORDER — SODIUM CHLORIDE 0.9 % IV BOLUS (SEPSIS)
1000.0000 mL | Freq: Once | INTRAVENOUS | Status: AC
  Administered 2017-04-16: 1000 mL via INTRAVENOUS

## 2017-04-16 MED ORDER — ALBUTEROL SULFATE HFA 108 (90 BASE) MCG/ACT IN AERS: 2.0000 | INHALATION_SPRAY | Freq: Four times a day (QID) | RESPIRATORY_TRACT | Status: DC | PRN

## 2017-04-16 MED ORDER — CLONAZEPAM 0.5 MG PO TABS: 0.5000 mg | ORAL_TABLET | Freq: Every day | ORAL | Status: DC

## 2017-04-16 MED ORDER — CHARCOAL ACTIVATED PO LIQD
50.0000 g | Freq: Once | ORAL | Status: AC
  Administered 2017-04-16: 50 g via ORAL
  Filled 2017-04-16: qty 240

## 2017-04-16 MED ORDER — BUDESONIDE 0.25 MG/2ML IN SUSP
0.2500 mg | Freq: Two times a day (BID) | RESPIRATORY_TRACT | Status: DC
  Administered 2017-04-17: 0.25 mg via RESPIRATORY_TRACT
  Filled 2017-04-16 (×4): qty 2

## 2017-04-16 MED ORDER — ACETAMINOPHEN 325 MG PO TABS
650.0000 mg | ORAL_TABLET | ORAL | Status: DC | PRN
  Administered 2017-04-17: 650 mg via ORAL
  Filled 2017-04-16: qty 2

## 2017-04-16 MED ORDER — FLUTICASONE PROPIONATE HFA 110 MCG/ACT IN AERO: 1.0000 | INHALATION_SPRAY | Freq: Two times a day (BID) | RESPIRATORY_TRACT | Status: DC

## 2017-04-16 NOTE — ED Notes (Signed)
Kim, RT reports Pulmicort not available and per [facility] is scheduled for tomorrow am.

## 2017-04-16 NOTE — ED Notes (Signed)
Pt requesting to have her cell phone- informed pt that it was locked up with her belongings and per protocol she could not have it, pt given ice chips per request. Pt calm, and cooperative, pt updated on plan of care and next step was to have tele psych consult and they would determine if she needed in patient or out patient treatment. Pt verbalized understanding. Tammy, sitter escorted pt to bathroom and assisted with turning on TV when returning to room.

## 2017-04-16 NOTE — Telephone Encounter (Signed)
Tried to call no answer 04/16/17

## 2017-04-16 NOTE — ED Triage Notes (Signed)
Pt reports taking approximately 20 50mg  zoloft po at 1249pm today. Pt reports she was trying to harm herself. Pt reports she has been on the zoloft x 2 weeks and states "it just made my mind think I should do that".

## 2017-04-16 NOTE — ED Notes (Signed)
Pt states she was at work waiting to go in and states had thoughts of overdosing. Pt states she has had prior thoughts of hurting herself but always talked herself out of it.

## 2017-04-16 NOTE — ED Provider Notes (Signed)
[facility] EMERGENCY DEPARTMENT Provider Note   CSN: [id number] Arrival date & time: 04/16/17  1351     History   Chief Complaint Chief Complaint  Patient presents with  . Drug Overdose    HPI Stephanie Torres is a 21 y.o. female.  HPI Patient admits to intentional overdose taking 03-2049 mg Zoloft tabs at 1249 today.  States his attempt to harm herself.  She has had increasing depressive thoughts and was recently placed on Zoloft.  States she has had no prior attempts to harm herself.  Had nausea and one episode of vomiting.  Denies any coingestants.  No alcohol or illegal drugs. Past Medical History:  Diagnosis Date  . Asthma   . Depression   . DUB (dysfunctional uterine bleeding)   . PCOS (polycystic ovarian syndrome)     Patient Active Problem List   Diagnosis Date Noted  . Depression 04/18/2016  . Ingrown toenail 04/12/2013  . PCOS (polycystic ovarian syndrome) 04/11/2013  . Hyperinsulinemia 11/21/2012  . Iron deficiency anemia 11/21/2012  . Other and unspecified hyperlipidemia 11/21/2012  . Gastritis 10/31/2012  . Migraine headache without aura 10/31/2012  . Asthma with acute exacerbation 10/31/2012  . Morbid obesity ([place]) 10/31/2012    Past Surgical History:  Procedure Laterality Date  . TONSILLECTOMY      OB History    Gravida Para Term Preterm AB Living   1 1           SAB TAB Ectopic Multiple Live Births                   Home Medications    Prior to Admission medications   Medication Sig Start Date End Date Taking? Authorizing Provider  albuterol (PROVENTIL HFA;VENTOLIN HFA) 108 (90 Base) MCG/ACT inhaler Inhale 2 puffs into the lungs every 6 (six) hours as needed for wheezing or shortness of breath. 02/10/17  Yes Volanda Napoleon, PA-C  clonazePAM (KLONOPIN) 0.5 MG tablet Take 1 tablet (0.5 mg total) by mouth at bedtime. prn 04/06/17  Yes Nilda Simmer, NP  fluticasone (FLOVENT HFA) 110 MCG/ACT inhaler Inhale 1 puff into the lungs 2 (two)  times daily. Rinse mouth after each use 04/06/17  Yes Nilda Simmer, NP  sertraline (ZOLOFT) 50 MG tablet Take 1/2 tab PO qd for first 6 days- Then 1 tab PO qd 04/06/17  Yes Nilda Simmer, NP  triamcinolone cream (KENALOG) 0.1 % Apply 1 application topically 2 (two) times daily. Prn rash; use up to 2 weeks 04/06/17  Yes Nilda Simmer, NP    Family History Family History  Problem Relation Age of Onset  . Diabetes Mother   . Hypertension Mother     Social History Social History   Tobacco Use  . Smoking status: Never Smoker  . Smokeless tobacco: Never Used  Substance Use Topics  . Alcohol use: No  . Drug use: No     Allergies   Patient has no known allergies.   Review of Systems Review of Systems  Constitutional: Negative for chills and fever.  Respiratory: Negative for cough and shortness of breath.   Cardiovascular: Negative for chest pain.  Gastrointestinal: Positive for nausea and vomiting. Negative for abdominal pain and diarrhea.  Musculoskeletal: Negative for back pain, myalgias and neck pain.  Skin: Negative for rash and wound.  Neurological: Negative for syncope, weakness, numbness and headaches.  Psychiatric/Behavioral: Positive for dysphoric mood and suicidal ideas.  All other systems reviewed and are negative.  Physical Exam Updated Vital Signs BP 122/69   Pulse 66   Temp 99.4 F (37.4 C)   Resp 18   Ht 5\' 4"  (1.626 m)   Wt 108.9 kg (240 lb)   LMP 03/28/2017   SpO2 99%   BMI 41.20 kg/m   Physical Exam  Constitutional: She is oriented to person, place, and time. She appears well-developed and well-nourished. No distress.  HENT:  Head: Normocephalic and atraumatic.  Mouth/Throat: Oropharynx is clear and moist. No oropharyngeal exudate.  Eyes: EOM are normal. Pupils are equal, round, and reactive to light.  Neck: Normal range of motion. Neck supple.  Cardiovascular: Normal rate and regular rhythm. Exam reveals no gallop and no  friction rub.  No murmur heard. Pulmonary/Chest: Effort normal and breath sounds normal. No stridor. No respiratory distress. She has no wheezes. She has no rales. She exhibits no tenderness.  Abdominal: Soft. Bowel sounds are normal. There is no tenderness. There is no rebound and no guarding.  Musculoskeletal: Normal range of motion. She exhibits no edema or tenderness.  Lymphadenopathy:    She has no cervical adenopathy.  Neurological: She is alert and oriented to person, place, and time.  Moving all extremities without focal deficit.  Sensation fully intact.  Skin: Skin is warm and dry. Capillary refill takes less than 2 seconds. No rash noted. No erythema.  Psychiatric:  Tearful.  Dysphoric.  Currently denying suicidal ideation  Nursing note and vitals reviewed.    ED Treatments / Results  Labs (all labs ordered are listed, but only abnormal results are displayed) Labs Reviewed  CBC WITH DIFFERENTIAL/PLATELET - Abnormal; Notable for the following components:      Result Value   Hemoglobin 10.5 (*)    HCT 33.6 (*)    MCV 68.4 (*)    MCH 21.4 (*)    RDW 17.0 (*)    All other components within normal limits  COMPREHENSIVE METABOLIC PANEL - Abnormal; Notable for the following components:   Glucose, Bld 107 (*)    Calcium 8.8 (*)    ALT 13 (*)    All other components within normal limits  URINALYSIS, ROUTINE W REFLEX MICROSCOPIC - Abnormal; Notable for the following components:   APPearance HAZY (*)    Protein, ur 30 (*)    Leukocytes, UA MODERATE (*)    Bacteria, UA RARE (*)    Squamous Epithelial / LPF 0-5 (*)    All other components within normal limits  ACETAMINOPHEN LEVEL - Abnormal; Notable for the following components:   Acetaminophen (Tylenol), Serum <10 (*)    All other components within normal limits  ACETAMINOPHEN LEVEL - Abnormal; Notable for the following components:   Acetaminophen (Tylenol), Serum <10 (*)    All other components within normal limits  RAPID  URINE DRUG SCREEN, HOSP PERFORMED  ETHANOL  SALICYLATE LEVEL  I-STAT BETA HCG BLOOD, ED ([facility], [facility], [facility] ONLY)    EKG  EKG Interpretation None       Radiology No results found.  Procedures Procedures (including critical care time)  Medications Ordered in ED Medications  acetaminophen (TYLENOL) tablet 650 mg (not administered)  albuterol (PROVENTIL HFA;VENTOLIN HFA) 108 (90 Base) MCG/ACT inhaler 2 puff (not administered)  clonazePAM (KLONOPIN) tablet 0.5 mg (not administered)  fluticasone (FLOVENT HFA) 110 MCG/ACT inhaler 1 puff (not administered)  sodium chloride 0.9 % bolus 1,000 mL (0 mLs Intravenous Stopped 04/16/17 1725)  charcoal activated (NO SORBITOL) (ACTIDOSE-AQUA) suspension 50 g (50 g Oral Given 04/16/17 1437)  Initial Impression / Assessment and Plan / ED Course  I have reviewed the triage vital signs and the nursing notes.  Pertinent labs & imaging results that were available during my care of the patient were reviewed by me and considered in my medical decision making (see chart for details).     Patient has been observed in the emergency department for 6 hours.  She is cleared for psychiatric evaluation.  Final Clinical Impressions(s) / ED Diagnoses   Final diagnoses:  Intentional drug overdose, initial encounter [facility])    ED Discharge Orders    None       Julianne Rice, MD 04/16/17 2012

## 2017-04-16 NOTE — BH Assessment (Signed)
Patriciaann Clan NP recommends inpatient when patient is medically cleared. TTS to look for placement.

## 2017-04-16 NOTE — Telephone Encounter (Signed)
Spoke with patient and informed her to per Linzie Collin- to stop medication, and if having suicidal thoughts go to the ER. Also advised patient that she may have an office visit on Wednesday. Patient asked what she could do in the meantime and she also stated that she has not exactly had suicidal thoughts but she has had dreams that she committed suicide by taking the whole bottle of medicine. States she has been having these nightmares since taking the medication. Please advise?

## 2017-04-16 NOTE — BH Assessment (Signed)
Tele Assessment Note   Patient Name: Stephanie Torres MRN: [id number] Referring Physician: Julianne Rice, MD Location of Patient: [facility] Location of Provider: [place] is an 22 y.o. female presenting to [facility] after overdosing on 20 Zoloft. The patient reports multiple stressors including a recent move, financial issues and past trauma. The patient has a history of depression but denies prior SI attempts. Reports SI thoughts in the last 6 months but no intent or plan until today. The patient was started on Zoloft 2 weeks ago. Reports worsening depression over the last few days. The patient admits to recent irritability, depressed mood, had depressed affect, poor insight and impulse control, appetite fluctuation, 5 hrs of sleep a night.  Denies HI. Denies A/V. Denies drug use.   The patient lives with her significant other, her child, father and a friend. States she works currently. The patient overdosed at work.  Denies prior inpatient treatment. Is currently treated by her PCP for medications.  The patient is willing to sign in voluntarily.    Patriciaann Clan NP recommends inpatient when patient is medically cleared. TTS to look for placement.   Diagnosis: MDD  Past Medical History:  Past Medical History:  Diagnosis Date  . Asthma   . Depression   . DUB (dysfunctional uterine bleeding)   . PCOS (polycystic ovarian syndrome)     Past Surgical History:  Procedure Laterality Date  . TONSILLECTOMY      Family History:  Family History  Problem Relation Age of Onset  . Diabetes Mother   . Hypertension Mother     Social History:  reports that  has never smoked. she has never used smokeless tobacco. She reports that she does not drink alcohol or use drugs.  Additional Social History:  Alcohol / Drug Use Pain Medications: see MAR Prescriptions: see MAR Over the Counter: see MAR History of alcohol / drug use?: No history of alcohol / drug  abuse  CIWA: CIWA-Ar BP: (!) 122/58 Pulse Rate: 76 COWS:    PATIENT STRENGTHS: (choose at least two) Average or above average intelligence General fund of knowledge  Allergies: No Known Allergies  Home Medications:  (Not in a hospital admission)  OB/GYN Status:  Patient's last menstrual period was 03/28/2017.  General Assessment Data [facility] of Assessment: [facility] TTS Assessment: In system Is this a Tele or Face-to-Face Assessment?: Tele Assessment Is this an Initial Assessment or a Re-assessment for this encounter?: Initial Assessment Marital status: Long term relationship Elwin Sleight name: Vandruff Is patient pregnant?: No Pregnancy Status: No Living Arrangements: Spouse/significant other, Children, Parent, Non-relatives/Friends(bf, child, dad, and friend) Can pt return to current living arrangement?: Yes Admission Status: Voluntary Is patient capable of signing voluntary admission?: Yes Referral Source: Self/Family/Friend Insurance type: Insurance risk surveyor Exam ([place]) Medical Exam completed: Yes  Crisis Care Plan Living Arrangements: Spouse/significant other, Children, Parent, Non-relatives/Friends(bf, child, dad, and friend) Legal Guardian: (n/a) Name of Psychiatrist: n/a(meds from PCP) Name of Therapist: n/a  Education Status Is patient currently in school?: No Highest grade of school patient has completed: 2 yrs of college  Risk to self with the past 6 months Suicidal Ideation: Yes-Currently Present(more depressed on zoloft last few days, ) Has patient been a risk to self within the past 6 months prior to admission? : Yes Suicidal Intent: Yes-Currently Present Has patient had any suicidal intent within the past 6 months prior to admission? : No Is patient at risk for suicide?: Yes  Suicidal Plan?: Yes-Currently Present Has patient had any suicidal plan within the past 6 months prior to admission? : No Specify Current Suicidal Plan: OD Access to  Means: Yes Specify Access to Suicidal Means: zoloft x20(on for 2 weeks, was on meds 2 yrs ago) What has been your use of drugs/alcohol within the last 12 months?: n/a Previous Attempts/Gestures: No How many times?: 0 Other Self Harm Risks: n/a Triggers for Past Attempts: None known Intentional Self Injurious Behavior: None Family Suicide History: Yes(dad, pgf, and great grandfather) Recent stressful life event(s): (moved x2, finances, past trauma) Persecutory voices/beliefs?: No Depression: Yes Depression Symptoms: Feeling angry/irritable Substance abuse history and/or treatment for substance abuse?: No Suicide prevention information given to non-admitted patients: Not applicable  Risk to Others within the past 6 months Homicidal Ideation: No Does patient have any lifetime risk of violence toward others beyond the six months prior to admission? : No Thoughts of Harm to Others: No Current Homicidal Intent: No Current Homicidal Plan: No Access to Homicidal Means: No Identified Victim: n/a History of harm to others?: No Assessment of Violence: None Noted Violent Behavior Description: n/a Does patient have access to weapons?: No Criminal Charges Pending?: No Does patient have a court date: No Is patient on probation?: No  Psychosis Hallucinations: None noted Delusions: None noted  Mental Status Report Appearance/Hygiene: Unremarkable Eye Contact: Fair Motor Activity: Freedom of movement Speech: Logical/coherent Level of Consciousness: Alert Mood: Depressed Affect: Depressed Anxiety Level: Minimal Thought Processes: Coherent, Relevant Judgement: Impaired Orientation: Person, Place, Time, Situation Obsessive Compulsive Thoughts/Behaviors: None  Cognitive Functioning Concentration: Normal Memory: Recent Intact, Remote Intact IQ: Average Insight: Poor Impulse Control: Poor Appetite: Fair(flucuates) Weight Loss: 0 Weight Gain: 0 Sleep: Decreased(wake up several times  in the night) Total Hours of Sleep: 5 Vegetative Symptoms: Staying in bed  ADLScreening [facility] Assessment Services) Patient's cognitive ability adequate to safely complete daily activities?: Yes Patient able to express need for assistance with ADLs?: Yes Independently performs ADLs?: Yes (appropriate for developmental age)  Prior Inpatient Therapy Prior Inpatient Therapy: No  Prior Outpatient Therapy Prior Outpatient Therapy: No Does patient have an ACCT team?: No Does patient have Intensive In-House Services?  : No Does patient have [facility] services? : No Does patient have P4CC services?: No  ADL Screening (condition at time of admission) Patient's cognitive ability adequate to safely complete daily activities?: Yes Is the patient deaf or have difficulty hearing?: No Does the patient have difficulty seeing, even when wearing glasses/contacts?: No Does the patient have difficulty concentrating, remembering, or making decisions?: No Patient able to express need for assistance with ADLs?: Yes Does the patient have difficulty dressing or bathing?: No Independently performs ADLs?: Yes (appropriate for developmental age)       Abuse/Neglect Assessment (Assessment to be complete while patient is alone) Abuse/Neglect Assessment Can Be Completed: (referenced memories of past trauma)     Advance Directives (For Healthcare) Does Patient Have a Medical Advance Directive?: No Would patient like information on creating a medical advance directive?: No - Patient declined    Additional Information 1:1 In Past 12 Months?: No CIRT Risk: No Elopement Risk: No Does patient have medical clearance?: Yes     Disposition:  Disposition Initial Assessment Completed for this Encounter: Yes Disposition of Patient: Inpatient treatment program Type of inpatient treatment program: Adult  This service was provided via telemedicine using a 2-way, interactive audio and video  technology.     Aileen Pilot [facility] 04/16/2017 10:19 PM

## 2017-04-16 NOTE — ED Notes (Signed)
RT notified of order  for pt to have Pulmicort neb tx.

## 2017-04-16 NOTE — ED Notes (Signed)
TTS in progress 

## 2017-04-16 NOTE — Telephone Encounter (Signed)
Stop medication immediately. Call back or go to ED if suicidal thoughts persist (may not be just med causing them). Follow up with me or her doctor this week.

## 2017-04-16 NOTE — ED Notes (Signed)
Pt informed of need for urine sample. Pt states she can't provide one at this time but will as soon as able

## 2017-04-16 NOTE — ED Notes (Signed)
Pt calm but tearful after TTS consult- reassurance given, pt said she did not want her Klonopin.

## 2017-04-16 NOTE — ED Notes (Signed)
Patient's boyfriend requested car keys which patient had.  Car keys were given to mother who was visiting patient and she took the keys to the boyfriend who was in the lobby.  Remaining personal belongings were placed back in locker.

## 2017-04-16 NOTE — ED Notes (Signed)
Poison control called to check on pt. Poison control states they are going to sign off on patient at this time.

## 2017-04-16 NOTE — Telephone Encounter (Signed)
Pt is having problems with the medications that Hoyle Sauer recently prescribed her. Mom states that she is having crying spells and crazy thoughts. Pt is wanting to be seen by [place] [facility] to talk to her about this. Please advise.

## 2017-04-16 NOTE — Telephone Encounter (Signed)
Left message return call 04/16/17 Hoyle Sauer stated for patient to stop medication. Office visit on Wednesday and if any suicidal/ homicidal thoughts needs to go to the ED.

## 2017-04-16 NOTE — ED Notes (Signed)
Pt mother and boyfriend visiting. Pt calm and cooperative. Sitter remains at bedside.

## 2017-04-17 ENCOUNTER — Inpatient Hospital Stay (HOSPITAL_COMMUNITY): Admission: AD | Admit: 2017-04-17 | Payer: BLUE CROSS/BLUE SHIELD | Source: Intra-hospital | Admitting: Psychiatry

## 2017-04-17 DIAGNOSIS — T50902A Poisoning by unspecified drugs, medicaments and biological substances, intentional self-harm, initial encounter: Secondary | ICD-10-CM | POA: Diagnosis not present

## 2017-04-17 DIAGNOSIS — Z046 Encounter for general psychiatric examination, requested by authority: Secondary | ICD-10-CM | POA: Diagnosis not present

## 2017-04-17 DIAGNOSIS — Z62810 Personal history of physical and sexual abuse in childhood: Secondary | ICD-10-CM | POA: Diagnosis not present

## 2017-04-17 DIAGNOSIS — J4520 Mild intermittent asthma, uncomplicated: Secondary | ICD-10-CM | POA: Diagnosis not present

## 2017-04-17 DIAGNOSIS — J45909 Unspecified asthma, uncomplicated: Secondary | ICD-10-CM | POA: Diagnosis not present

## 2017-04-17 DIAGNOSIS — R45851 Suicidal ideations: Secondary | ICD-10-CM | POA: Diagnosis not present

## 2017-04-17 DIAGNOSIS — Z915 Personal history of self-harm: Secondary | ICD-10-CM | POA: Diagnosis not present

## 2017-04-17 DIAGNOSIS — F332 Major depressive disorder, recurrent severe without psychotic features: Secondary | ICD-10-CM | POA: Diagnosis not present

## 2017-04-17 DIAGNOSIS — Z79899 Other long term (current) drug therapy: Secondary | ICD-10-CM | POA: Diagnosis not present

## 2017-04-17 DIAGNOSIS — F329 Major depressive disorder, single episode, unspecified: Secondary | ICD-10-CM | POA: Diagnosis not present

## 2017-04-17 DIAGNOSIS — E282 Polycystic ovarian syndrome: Secondary | ICD-10-CM | POA: Diagnosis not present

## 2017-04-17 MED ORDER — IBUPROFEN 800 MG PO TABS
800.0000 mg | ORAL_TABLET | Freq: Once | ORAL | Status: AC
  Administered 2017-04-17: 800 mg via ORAL
  Filled 2017-04-17: qty 1

## 2017-04-17 NOTE — Progress Notes (Signed)
Patient meets criteria for inpatient treatment. CSW faxed referrals to the following inpatient facilities for review:  [facility], [facility], [facility], [place], [place], [place], [place], [place], [facility] [place], [facility], [facility]   TTS will continue to seek bed placement.   Radonna Ricker MSW, [place] Disposition [phone]

## 2017-04-17 NOTE — Progress Notes (Signed)
Accepted to [facility] in [place], [place].   Accepting/attending physician is Dr. Jonelle Sports Patient is going to their [facility] Number for report is [phone].  Patient can transport at anytime, the bed is ready.   Meyer Russel, RN notified.   Radonna Ricker MSW, [place] Disposition [phone]

## 2017-04-17 NOTE — ED Notes (Signed)
Called [facility] for transport to [facility] [place]. 

## 2017-04-17 NOTE — Telephone Encounter (Signed)
Patient's mom walked in on yesterday 04/16/17 to inform us that patient was taken to hospital via EMS due to taking to many pills.

## 2017-04-17 NOTE — BH Assessment (Signed)
[place] Assessment Progress Note  PT reassessed this AM. Pt denies current SI, HI, AVH. Pt had taken antidepressants in the past-@ 2 years ago for postpartum depression. Pt admitted to adverse effects after taking the Zoloft, which she started 2 weeks ago. These effects include SI and AH, which she described as "people arguing in my head about what I should or shouldn't do". Pt denied ever having these types of symptoms on meds before. Pt ODd on the Zoloft as a result of these adverse effects-feeling compelled to do it. Although pt is feeling better today, IP treatment is still recommended due to incident occurring less than 24 hours ago. Pt is still amenable to IP treatment.   Kenna Gilbert. Lovena Le, [place], [place], [facility] Counselor

## 2017-04-18 ENCOUNTER — Ambulatory Visit: Payer: BLUE CROSS/BLUE SHIELD | Admitting: Nurse Practitioner

## 2017-04-18 DIAGNOSIS — F332 Major depressive disorder, recurrent severe without psychotic features: Secondary | ICD-10-CM | POA: Diagnosis not present

## 2017-04-19 DIAGNOSIS — F332 Major depressive disorder, recurrent severe without psychotic features: Secondary | ICD-10-CM | POA: Diagnosis not present

## 2017-04-20 DIAGNOSIS — F332 Major depressive disorder, recurrent severe without psychotic features: Secondary | ICD-10-CM | POA: Diagnosis not present

## 2017-04-21 DIAGNOSIS — E282 Polycystic ovarian syndrome: Secondary | ICD-10-CM | POA: Diagnosis not present

## 2017-04-21 DIAGNOSIS — J4520 Mild intermittent asthma, uncomplicated: Secondary | ICD-10-CM | POA: Diagnosis not present

## 2017-04-21 DIAGNOSIS — R45851 Suicidal ideations: Secondary | ICD-10-CM | POA: Diagnosis not present

## 2017-04-21 DIAGNOSIS — F332 Major depressive disorder, recurrent severe without psychotic features: Secondary | ICD-10-CM | POA: Diagnosis not present

## 2017-04-23 DIAGNOSIS — F332 Major depressive disorder, recurrent severe without psychotic features: Secondary | ICD-10-CM | POA: Diagnosis not present

## 2017-04-25 ENCOUNTER — Encounter: Payer: Self-pay | Admitting: Nurse Practitioner

## 2017-04-25 ENCOUNTER — Ambulatory Visit (INDEPENDENT_AMBULATORY_CARE_PROVIDER_SITE_OTHER): Payer: BLUE CROSS/BLUE SHIELD | Admitting: Nurse Practitioner

## 2017-04-25 VITALS — BP 100/80 | Ht 63.5 in | Wt 235.0 lb

## 2017-04-25 DIAGNOSIS — F419 Anxiety disorder, unspecified: Secondary | ICD-10-CM

## 2017-04-25 DIAGNOSIS — F329 Major depressive disorder, single episode, unspecified: Secondary | ICD-10-CM | POA: Diagnosis not present

## 2017-04-25 DIAGNOSIS — T50902S Poisoning by unspecified drugs, medicaments and biological substances, intentional self-harm, sequela: Secondary | ICD-10-CM | POA: Diagnosis not present

## 2017-04-25 DIAGNOSIS — F32A Depression, unspecified: Secondary | ICD-10-CM

## 2017-04-27 ENCOUNTER — Encounter: Payer: Self-pay | Admitting: Nurse Practitioner

## 2017-04-27 DIAGNOSIS — F329 Major depressive disorder, single episode, unspecified: Secondary | ICD-10-CM | POA: Insufficient documentation

## 2017-04-27 DIAGNOSIS — F419 Anxiety disorder, unspecified: Secondary | ICD-10-CM | POA: Insufficient documentation

## 2017-04-27 DIAGNOSIS — T50902S Poisoning by unspecified drugs, medicaments and biological substances, intentional self-harm, sequela: Secondary | ICD-10-CM | POA: Insufficient documentation

## 2017-04-27 NOTE — Progress Notes (Signed)
Subjective: Presents after recent hospitalization for an intentional overdose of Zoloft.  It is unclear whether the Zoloft added to her symptoms or if this was an escalating situation over time.  Her chart has been marked so no further Zoloft will be prescribed.  Sleep has improved.  Continues to have some agitation anxiety and jitteriness.  No further suicidal thoughts or ideation.  Has a good support system with family and friends.  Will be working this weekend which she states is good for her because it keeps her mind occupied.  Describes her suicidal thoughts as "a demon" telling her to take the pills.  Describes as a "dark cloud".  Has not had any of these thoughts since then. Depression screen [facility] 2/9 04/25/2017 04/06/2017 04/06/2017  Decreased Interest 0 3 3  Down, Depressed, Hopeless 1 3 3   PHQ - 2 Score 1 6 6   Altered sleeping 0 2 2  Tired, decreased energy 0 3 3  Change in appetite 1 2 2   Feeling bad or failure about yourself  2 2 2   Trouble concentrating 0 0 0  Moving slowly or fidgety/restless 1 1 1   Suicidal thoughts 0 1 1  PHQ-9 Score 5 17 17   Difficult doing work/chores Very difficult Very difficult -     Objective:   BP 100/80   Ht 5' 3.5" (1.613 m)   Wt 235 lb (106.6 kg)   LMP 03/28/2017   BMI 40.98 kg/m  NAD.  Alert, oriented.  Mildly anxious affect.  Thoughts logical coherent and relevant.  Dressed appropriately.  Making good eye contact.  Lungs clear.  Heart regular rate and rhythm.  Assessment:   Problem List Items Addressed This Visit      Other   Anxiety and depression - Primary   Relevant Medications   citalopram (CELEXA) 20 MG tablet   Other Relevant Orders   Ambulatory referral to Psychology   Ambulatory referral to Psychiatry   Suicidal overdose, sequela [facility])   Relevant Orders   Ambulatory referral to Psychology   Ambulatory referral to Psychiatry       Plan:  Continue Celexa as directed.  Patient states that her Klonopin may have been thrown away  or misplaced, questioning whether she can get a refill today.  Explained that that would not be possible at this point due to recent suicidal gesture.  Will refer for counseling and medication management.  And will be up to psychiatry whether she can have controlled substances.  Patient agrees to seek help immediately in the meantime if she becomes suicidal or homicidal.   Recheck here as needed.

## 2017-04-30 ENCOUNTER — Encounter: Payer: Self-pay | Admitting: Family Medicine

## 2017-04-30 ENCOUNTER — Telehealth: Payer: Self-pay | Admitting: Family Medicine

## 2017-04-30 NOTE — Telephone Encounter (Signed)
Message for Stephanie Torres---Patient is requesting for you to fill out her FMLA due to her mental state she states you know more about what going on and what happen. She needs it by 05/09/17.In your box.

## 2017-05-09 ENCOUNTER — Ambulatory Visit: Payer: BLUE CROSS/BLUE SHIELD | Admitting: Nurse Practitioner

## 2017-05-21 DIAGNOSIS — F321 Major depressive disorder, single episode, moderate: Secondary | ICD-10-CM | POA: Diagnosis not present

## 2017-06-11 ENCOUNTER — Other Ambulatory Visit: Payer: Self-pay | Admitting: Nurse Practitioner

## 2017-06-11 ENCOUNTER — Telehealth: Payer: Self-pay | Admitting: Nurse Practitioner

## 2017-06-11 DIAGNOSIS — F321 Major depressive disorder, single episode, moderate: Secondary | ICD-10-CM | POA: Diagnosis not present

## 2017-06-11 MED ORDER — CITALOPRAM HYDROBROMIDE 20 MG PO TABS: 20.0000 mg | ORAL_TABLET | Freq: Every day | ORAL | 2 refills | Status: DC

## 2017-06-11 NOTE — Telephone Encounter (Signed)
I sent in refills to last her until she sees psychiatrist.

## 2017-06-11 NOTE — Telephone Encounter (Signed)
Pt is needing refills on  citalopram (CELEXA) 20 MG tablet Pt is unable to see therapist till April and is almost out. Please advise.   [facility]

## 2017-06-11 NOTE — Telephone Encounter (Signed)
Patient notified and verbalized understanding. 

## 2017-06-21 ENCOUNTER — Telehealth: Payer: Self-pay | Admitting: *Deleted

## 2017-06-21 NOTE — Telephone Encounter (Signed)
Mother called and stated that the patient was recently an inpatient in mental health for an intentional overdose and is still having problems. Mother states the patient is unable to get in to see psychiatrist until April. Consult with Dr Purvis Kilts mother to take patient to ER for evaluation and treatment. Mother verbalized understanding and agreed to take patient to ER.

## 2017-06-22 ENCOUNTER — Ambulatory Visit (INDEPENDENT_AMBULATORY_CARE_PROVIDER_SITE_OTHER): Payer: BLUE CROSS/BLUE SHIELD | Admitting: *Deleted

## 2017-06-22 DIAGNOSIS — Z3042 Encounter for surveillance of injectable contraceptive: Secondary | ICD-10-CM

## 2017-06-22 MED ORDER — MEDROXYPROGESTERONE ACETATE 150 MG/ML IM SUSP
150.0000 mg | Freq: Once | INTRAMUSCULAR | Status: AC
  Administered 2017-06-22: 150 mg via INTRAMUSCULAR

## 2017-07-10 DIAGNOSIS — F321 Major depressive disorder, single episode, moderate: Secondary | ICD-10-CM | POA: Diagnosis not present

## 2017-07-13 ENCOUNTER — Emergency Department (HOSPITAL_COMMUNITY)
Admission: EM | Admit: 2017-07-13 | Discharge: 2017-07-13 | Disposition: A | Discharge status: Home / Self Care | Payer: BLUE CROSS/BLUE SHIELD | Attending: Emergency Medicine | Admitting: Emergency Medicine

## 2017-07-13 ENCOUNTER — Encounter (HOSPITAL_COMMUNITY): Payer: Self-pay | Admitting: Emergency Medicine

## 2017-07-13 ENCOUNTER — Other Ambulatory Visit: Payer: Self-pay

## 2017-07-13 ENCOUNTER — Emergency Department (HOSPITAL_COMMUNITY): Payer: BLUE CROSS/BLUE SHIELD

## 2017-07-13 DIAGNOSIS — S5012XA Contusion of left forearm, initial encounter: Secondary | ICD-10-CM | POA: Diagnosis not present

## 2017-07-13 DIAGNOSIS — F419 Anxiety disorder, unspecified: Secondary | ICD-10-CM | POA: Diagnosis not present

## 2017-07-13 DIAGNOSIS — Z79899 Other long term (current) drug therapy: Secondary | ICD-10-CM | POA: Insufficient documentation

## 2017-07-13 DIAGNOSIS — Y9389 Activity, other specified: Secondary | ICD-10-CM | POA: Insufficient documentation

## 2017-07-13 DIAGNOSIS — F329 Major depressive disorder, single episode, unspecified: Secondary | ICD-10-CM | POA: Insufficient documentation

## 2017-07-13 DIAGNOSIS — W228XXA Striking against or struck by other objects, initial encounter: Secondary | ICD-10-CM | POA: Diagnosis not present

## 2017-07-13 DIAGNOSIS — J45909 Unspecified asthma, uncomplicated: Secondary | ICD-10-CM | POA: Diagnosis not present

## 2017-07-13 DIAGNOSIS — S59901A Unspecified injury of right elbow, initial encounter: Secondary | ICD-10-CM | POA: Diagnosis not present

## 2017-07-13 DIAGNOSIS — Y998 Other external cause status: Secondary | ICD-10-CM | POA: Insufficient documentation

## 2017-07-13 DIAGNOSIS — S59911A Unspecified injury of right forearm, initial encounter: Secondary | ICD-10-CM | POA: Diagnosis not present

## 2017-07-13 DIAGNOSIS — Y92810 Car as the place of occurrence of the external cause: Secondary | ICD-10-CM | POA: Diagnosis not present

## 2017-07-13 DIAGNOSIS — M25521 Pain in right elbow: Secondary | ICD-10-CM | POA: Diagnosis not present

## 2017-07-13 MED ORDER — IBUPROFEN 600 MG PO TABS: 600.0000 mg | ORAL_TABLET | Freq: Four times a day (QID) | ORAL | 0 refills | Status: DC | PRN

## 2017-07-13 NOTE — Discharge Instructions (Signed)
See your Physician for recheck.  °

## 2017-07-13 NOTE — ED Notes (Signed)
ED Provider at bedside. 

## 2017-07-13 NOTE — ED Triage Notes (Signed)
Pt had right arm slammed in a car door. C/o of elbow pain with movement. Pt is unable to move arm

## 2017-07-13 NOTE — ED Provider Notes (Signed)
[facility] EMERGENCY DEPARTMENT Provider Note   CSN: [id number] Arrival date & time: 07/13/17  1803     History   Chief Complaint Chief Complaint  Patient presents with  . Arm Pain    HPI Stephanie Torres is a 22 y.o. female.  The history is provided by the patient. No language interpreter was used.  Arm Pain  This is a new problem. The current episode started 3 to 5 hours ago. The problem occurs constantly. Nothing aggravates the symptoms. Nothing relieves the symptoms. She has tried nothing for the symptoms. The treatment provided no relief.    Past Medical History:  Diagnosis Date  . Asthma   . Depression   . DUB (dysfunctional uterine bleeding)   . PCOS (polycystic ovarian syndrome)     Patient Active Problem List   Diagnosis Date Noted  . Suicidal overdose, sequela ([place]) 04/27/2017  . Anxiety and depression 04/27/2017  . Depression 04/18/2016  . Ingrown toenail 04/12/2013  . PCOS (polycystic ovarian syndrome) 04/11/2013  . Hyperinsulinemia 11/21/2012  . Iron deficiency anemia 11/21/2012  . Other and unspecified hyperlipidemia 11/21/2012  . Gastritis 10/31/2012  . Migraine headache without aura 10/31/2012  . Asthma with acute exacerbation 10/31/2012  . Morbid obesity ([place]) 10/31/2012    Past Surgical History:  Procedure Laterality Date  . TONSILLECTOMY      OB History    Gravida Para Term Preterm AB Living   1 1           SAB TAB Ectopic Multiple Live Births                   Home Medications    Prior to Admission medications   Medication Sig Start Date End Date Taking? Authorizing Provider  albuterol (PROVENTIL HFA;VENTOLIN HFA) 108 (90 Base) MCG/ACT inhaler Inhale 2 puffs into the lungs every 6 (six) hours as needed for wheezing or shortness of breath. 02/10/17   Volanda Napoleon, PA-C  citalopram (CELEXA) 20 MG tablet Take 1 tablet (20 mg total) by mouth daily. 06/11/17   Nilda Simmer, NP  clonazePAM (KLONOPIN) 0.5 MG tablet Take 1 tablet  (0.5 mg total) by mouth at bedtime. prn 04/06/17   Nilda Simmer, NP  fluticasone (FLOVENT HFA) 110 MCG/ACT inhaler Inhale 1 puff into the lungs 2 (two) times daily. Rinse mouth after each use 04/06/17   Nilda Simmer, NP  triamcinolone cream (KENALOG) 0.1 % Apply 1 application topically 2 (two) times daily. Prn rash; use up to 2 weeks 04/06/17   Nilda Simmer, NP    Family History Family History  Problem Relation Age of Onset  . Diabetes Mother   . Hypertension Mother     Social History Social History   Tobacco Use  . Smoking status: Never Smoker  . Smokeless tobacco: Never Used  Substance Use Topics  . Alcohol use: No  . Drug use: No     Allergies   Zoloft [sertraline hcl]   Review of Systems Review of Systems  All other systems reviewed and are negative.    Physical Exam Updated Vital Signs BP (!) 139/56   Pulse (!) 101   Temp 98.6 F (37 C)   Resp 18   SpO2 100%   Physical Exam  Constitutional: She appears well-developed and well-nourished.  HENT:  Head: Normocephalic.  Musculoskeletal: She exhibits tenderness.  Tender right forearm,  Pain with range of motion,  nv and ns intact  Neurological: She is alert.  Skin: Skin is warm.  Psychiatric: She has a normal mood and affect.  Nursing note and vitals reviewed.    ED Treatments / Results  Labs (all labs ordered are listed, but only abnormal results are displayed) Labs Reviewed - No data to display  EKG  EKG Interpretation None       Radiology Dg Elbow Complete Right  Result Date: 07/13/2017 CLINICAL DATA:  Slammed in car door. Pain and difficulty with extension. EXAM: RIGHT ELBOW - COMPLETE 3+ VIEW COMPARISON:  None. FINDINGS: There is no evidence of fracture, dislocation, or joint effusion. There is no evidence of arthropathy or other focal bone abnormality. Soft tissues are unremarkable. IMPRESSION: Negative. Electronically Signed   By: Nelson Chimes M.D.   On: 07/13/2017 18:53     Procedures Procedures (including critical care time)  Medications Ordered in ED Medications - No data to display   Initial Impression / Assessment and Plan / ED Course  I have reviewed the triage vital signs and the nursing notes.  Pertinent labs & imaging results that were available during my care of the patient were reviewed by me and considered in my medical decision making (see chart for details).       Final Clinical Impressions(s) / ED Diagnoses   Final diagnoses:  Contusion of left forearm, initial encounter    ED Discharge Orders        Ordered    ibuprofen (ADVIL,MOTRIN) 600 MG tablet  Every 6 hours PRN     07/13/17 1916    An After Visit Summary was printed and given to the patient.    Fransico Meadow, [place] 07/13/17 1916    Fredia Sorrow, MD 07/14/17 [phone]

## 2017-08-07 DIAGNOSIS — F321 Major depressive disorder, single episode, moderate: Secondary | ICD-10-CM | POA: Diagnosis not present

## 2017-09-07 ENCOUNTER — Ambulatory Visit: Payer: BLUE CROSS/BLUE SHIELD

## 2017-09-11 ENCOUNTER — Ambulatory Visit (INDEPENDENT_AMBULATORY_CARE_PROVIDER_SITE_OTHER): Payer: BLUE CROSS/BLUE SHIELD

## 2017-09-11 DIAGNOSIS — Z3042 Encounter for surveillance of injectable contraceptive: Secondary | ICD-10-CM | POA: Diagnosis not present

## 2017-09-11 MED ORDER — MEDROXYPROGESTERONE ACETATE 150 MG/ML IM SUSP
150.0000 mg | Freq: Once | INTRAMUSCULAR | Status: AC
  Administered 2017-09-11: 150 mg via INTRAMUSCULAR

## 2017-09-11 NOTE — Patient Instructions (Signed)
Next shot due June 25-July 9

## 2017-10-24 ENCOUNTER — Ambulatory Visit (INDEPENDENT_AMBULATORY_CARE_PROVIDER_SITE_OTHER): Payer: BLUE CROSS/BLUE SHIELD | Admitting: Family Medicine

## 2017-10-24 ENCOUNTER — Encounter: Payer: Self-pay | Admitting: Family Medicine

## 2017-10-24 VITALS — Temp 98.2°F | Ht 63.5 in | Wt 258.0 lb

## 2017-10-24 DIAGNOSIS — R062 Wheezing: Secondary | ICD-10-CM

## 2017-10-24 DIAGNOSIS — J329 Chronic sinusitis, unspecified: Secondary | ICD-10-CM

## 2017-10-24 MED ORDER — ALBUTEROL SULFATE HFA 108 (90 BASE) MCG/ACT IN AERS: 2.0000 | INHALATION_SPRAY | Freq: Four times a day (QID) | RESPIRATORY_TRACT | 0 refills | Status: DC | PRN

## 2017-10-24 MED ORDER — PREDNISONE 10 MG PO TABS: ORAL_TABLET | ORAL | 0 refills | Status: DC

## 2017-10-24 MED ORDER — CEFDINIR 300 MG PO CAPS: 300.0000 mg | ORAL_CAPSULE | Freq: Two times a day (BID) | ORAL | 0 refills | Status: AC

## 2017-10-24 NOTE — Progress Notes (Signed)
   Subjective:    Patient ID: Stephanie Torres, female    DOB: 12-Oct-1995, 22 y.o.   MRN: [id number]  Cough  This is a new problem. The current episode started in the past 7 days. Associated symptoms include nasal congestion and wheezing. She has tried OTC cough suppressant for the symptoms.   Throat got very sore  Feeling chilled  Energy level poor  Feels tight in the chest  Pain worse with movement  inflammed  Non smokr  Some prod cough and phlegmy   Rob dm max strength not restin well   Using inhaler for the sym and tohelping    Review of Systems  Respiratory: Positive for cough and wheezing.        Objective:   Physical Exam  Alert, mild malaise. Hydration good Vitals stable. frontal/ maxillary tenderness evident positive nasal congestion. pharynx normal neck supple  lungs clear/no crackles however positive wheezes. heart regular in rhythm       Assessment & Plan:  Impression rhinosinusitis likely post viral, discussed with patient. plan antibiotics prescribed. Questions answered. Symptomatic care discussed. warning signs discussed. WSL Moderatem m reactive airways discussed.  We will add prednisone taper  rationale

## 2017-11-28 ENCOUNTER — Ambulatory Visit: Payer: BLUE CROSS/BLUE SHIELD

## 2017-11-29 ENCOUNTER — Ambulatory Visit (INDEPENDENT_AMBULATORY_CARE_PROVIDER_SITE_OTHER): Payer: BLUE CROSS/BLUE SHIELD | Admitting: *Deleted

## 2017-11-29 DIAGNOSIS — Z3042 Encounter for surveillance of injectable contraceptive: Secondary | ICD-10-CM | POA: Diagnosis not present

## 2017-11-29 MED ORDER — MEDROXYPROGESTERONE ACETATE 150 MG/ML IM SUSP
150.0000 mg | Freq: Once | INTRAMUSCULAR | Status: AC
  Administered 2017-11-29: 150 mg via INTRAMUSCULAR

## 2017-12-12 ENCOUNTER — Encounter: Payer: Self-pay | Admitting: Nurse Practitioner

## 2017-12-12 ENCOUNTER — Ambulatory Visit (INDEPENDENT_AMBULATORY_CARE_PROVIDER_SITE_OTHER): Payer: BLUE CROSS/BLUE SHIELD | Admitting: Nurse Practitioner

## 2017-12-12 VITALS — BP 112/74 | Ht 63.5 in | Wt 255.4 lb

## 2017-12-12 DIAGNOSIS — F419 Anxiety disorder, unspecified: Secondary | ICD-10-CM | POA: Diagnosis not present

## 2017-12-12 DIAGNOSIS — F329 Major depressive disorder, single episode, unspecified: Secondary | ICD-10-CM

## 2017-12-12 DIAGNOSIS — F32A Depression, unspecified: Secondary | ICD-10-CM

## 2017-12-12 MED ORDER — CITALOPRAM HYDROBROMIDE 20 MG PO TABS: 20.0000 mg | ORAL_TABLET | Freq: Every day | ORAL | 2 refills | Status: DC

## 2017-12-12 MED ORDER — CLONAZEPAM 0.5 MG PO TABS: ORAL_TABLET | ORAL | 2 refills | Status: DC

## 2017-12-12 NOTE — Patient Instructions (Signed)
Take 1/2 - 1 Klonopin at night as needed for sleep Start with 1/2 tab of Celexa for a few days; may increase to a whole tab if needed.

## 2017-12-13 ENCOUNTER — Other Ambulatory Visit: Payer: Self-pay

## 2017-12-13 ENCOUNTER — Encounter (HOSPITAL_COMMUNITY): Payer: Self-pay | Admitting: Emergency Medicine

## 2017-12-13 ENCOUNTER — Emergency Department (HOSPITAL_COMMUNITY)
Admission: EM | Admit: 2017-12-13 | Discharge: 2017-12-13 | Disposition: A | Discharge status: Home / Self Care | Payer: BLUE CROSS/BLUE SHIELD | Attending: Emergency Medicine | Admitting: Emergency Medicine

## 2017-12-13 ENCOUNTER — Telehealth: Payer: Self-pay | Admitting: *Deleted

## 2017-12-13 DIAGNOSIS — F419 Anxiety disorder, unspecified: Secondary | ICD-10-CM | POA: Diagnosis not present

## 2017-12-13 DIAGNOSIS — Z79899 Other long term (current) drug therapy: Secondary | ICD-10-CM | POA: Diagnosis not present

## 2017-12-13 DIAGNOSIS — F410 Panic disorder [episodic paroxysmal anxiety] without agoraphobia: Secondary | ICD-10-CM | POA: Insufficient documentation

## 2017-12-13 DIAGNOSIS — J45909 Unspecified asthma, uncomplicated: Secondary | ICD-10-CM | POA: Insufficient documentation

## 2017-12-13 HISTORY — DX: Anxiety disorder, unspecified: F41.9

## 2017-12-13 LAB — TROPONIN I

## 2017-12-13 LAB — URINALYSIS, ROUTINE W REFLEX MICROSCOPIC
Bilirubin Urine: NEGATIVE
Glucose, UA: NEGATIVE mg/dL
HGB URINE DIPSTICK: NEGATIVE
KETONES UR: NEGATIVE mg/dL
LEUKOCYTES UA: NEGATIVE
Nitrite: NEGATIVE
PROTEIN: NEGATIVE mg/dL
Specific Gravity, Urine: 1.012 (ref 1.005–1.030)
pH: 9 — ABNORMAL HIGH (ref 5.0–8.0)

## 2017-12-13 LAB — CBC WITH DIFFERENTIAL/PLATELET
BASOS ABS: 0.1 10*3/uL (ref 0.0–0.1)
Basophils Relative: 0 %
EOS PCT: 3 %
Eosinophils Absolute: 0.4 10*3/uL (ref 0.0–0.7)
HCT: 35.1 % — ABNORMAL LOW (ref 36.0–46.0)
Hemoglobin: 10.8 g/dL — ABNORMAL LOW (ref 12.0–15.0)
LYMPHS ABS: 3.9 10*3/uL (ref 0.7–4.0)
LYMPHS PCT: 28 %
MCH: 20.9 pg — AB (ref 26.0–34.0)
MCHC: 30.8 g/dL (ref 30.0–36.0)
MCV: 67.9 fL — ABNORMAL LOW (ref 78.0–100.0)
Monocytes Absolute: 0.9 10*3/uL (ref 0.1–1.0)
Monocytes Relative: 6 %
Neutro Abs: 8.7 10*3/uL — ABNORMAL HIGH (ref 1.7–7.7)
Neutrophils Relative %: 63 %
PLATELETS: 468 10*3/uL — AB (ref 150–400)
RBC: 5.17 MIL/uL — AB (ref 3.87–5.11)
RDW: 18.2 % — ABNORMAL HIGH (ref 11.5–15.5)
WBC: 13.8 10*3/uL — AB (ref 4.0–10.5)

## 2017-12-13 LAB — RAPID URINE DRUG SCREEN, HOSP PERFORMED
AMPHETAMINES: NOT DETECTED
BENZODIAZEPINES: NOT DETECTED
Cocaine: NOT DETECTED
OPIATES: NOT DETECTED
Tetrahydrocannabinol: NOT DETECTED

## 2017-12-13 LAB — LIPASE, BLOOD: LIPASE: 29 U/L (ref 11–51)

## 2017-12-13 LAB — COMPREHENSIVE METABOLIC PANEL
ALT: 15 U/L (ref 0–44)
AST: 17 U/L (ref 15–41)
Albumin: 3.6 g/dL (ref 3.5–5.0)
Alkaline Phosphatase: 65 U/L (ref 38–126)
Anion gap: 6 (ref 5–15)
BUN: 8 mg/dL (ref 6–20)
CALCIUM: 8.9 mg/dL (ref 8.9–10.3)
CO2: 29 mmol/L (ref 22–32)
CREATININE: 0.67 mg/dL (ref 0.44–1.00)
Chloride: 105 mmol/L (ref 98–111)
Glucose, Bld: 99 mg/dL (ref 70–99)
Potassium: 4.1 mmol/L (ref 3.5–5.1)
Sodium: 140 mmol/L (ref 135–145)
Total Bilirubin: 1 mg/dL (ref 0.3–1.2)
Total Protein: 7.4 g/dL (ref 6.5–8.1)

## 2017-12-13 LAB — PREGNANCY, URINE: PREG TEST UR: NEGATIVE

## 2017-12-13 LAB — ETHANOL: Alcohol, Ethyl (B): <10 mg/dL (ref <10)

## 2017-12-13 MED ORDER — HYDROXYZINE HCL 25 MG PO TABS
25.0000 mg | ORAL_TABLET | Freq: Once | ORAL | Status: AC
  Administered 2017-12-13: 25 mg via ORAL
  Filled 2017-12-13: qty 1

## 2017-12-13 MED ORDER — ACETAMINOPHEN 325 MG PO TABS
650.0000 mg | ORAL_TABLET | Freq: Once | ORAL | Status: AC
  Administered 2017-12-13: 650 mg via ORAL
  Filled 2017-12-13: qty 2

## 2017-12-13 MED ORDER — HYDROXYZINE HCL 25 MG PO TABS: 25.0000 mg | ORAL_TABLET | Freq: Four times a day (QID) | ORAL | 0 refills | Status: DC | PRN

## 2017-12-13 NOTE — Telephone Encounter (Signed)
Pt called. Seen yesterday for anxiety. Worse today. Stomach upset, nausea, whole body shaking, felt like she was going to collapse. Did not go to work bc she felt like something bad would happen to her. Feels like an elephant on her chest, sob, chest pain. No thoughts of hurting herself. Pt advised to go to ED. She states she will go to [facility].

## 2017-12-13 NOTE — ED Triage Notes (Addendum)
Pt c/o worsening anxiety over the past x 2 days. Pt also reports episodes of sleep walking and difficulty sleeping. Denies SI/HI. Pt started back on Celexa and Klonopin yesterday. Pt reports chest tightness when anxiety worsens.

## 2017-12-13 NOTE — Discharge Instructions (Signed)
Substance Abuse Treatment Programs ° °Intensive Outpatient Programs °[facility]     °[address]      °[place], [place]                   °[phone]      ° °[facility] °[address] °[place], [place] °[phone] ° °[facility]     °(Inpatient and outpatient)     °[address]           °[phone]   ° °[facility] °[phone] (Suboxone and Methadone) ° °[address]      °[place], [place] [address]      °336-882-2125      ° °[address] °[place], [place] °852-3033 ° °[facility] (Outpatient/Inpatient, [facility])    °(insurance only) [phone]      °       °[facility] (Groups & Residential) °[place], [place] °336-389-1413 ° °   °[facility]     °[address]     °[place], [place]      °[phone]      ° °[facility] (for caregivers and family) °[address] °[place], [place] °336-299-4655 ° ° ° ° ° °Residential Treatment Programs °[facility]      °[address], [place], [place] [address]  °([phone]      ° °[facility] °[address] [place], [place] [address] °[phone] ° °[facility]        °336-248-8914      ° °[facility] °1-800-659-3381 ° °[facility]             °[address]                                         °[place], [place]                                                °877-615-2722 or 336-784-9470                              ° °[facility] [place] °[address] °[place], [address] °1.877.941.8954 ° °[facility]    °[address]      °[place], [place]     °[phone]      ° °[facility] °[address] °, [place] °336-285-9073 ° °[facility]   °[address]     °[place], [place] [address]     °[phone]      °Admissions: 8am-3pm M-F ° °[facility] °[address] °[place],  [place] °336-227-7417 ° °[facility]: Residential Program (90 Days)   °[place], [place]      °336-725-8389 or 800-758-6077    ° °[facility] °[place],  °(Walk in Hours over the weekend or by referral) ° °[facility] °[address], [place],  [address] °(336) [phone] ° °Crisis Mobile: [facility]:  1-877-626-1772 (for crisis response 24 hours a day) °[facility] Hotline:      1-800-256-2452 °Outpatient Psychiatry and Counseling ° °[facility]: Mobile Crisis   Management 24 hours:  1-877-626-1772 ° °[facility] sliding scale fee and walk in schedule: M-F 8am-12pm/1pm-3pm °[address]  °[place], [place] [address] °[phone] ° °[facility] °[address] °[place], [place] [address] °336-703-9650 ° °[facility] ([facility] known [facility])- new patient walk-in appointments available Monday - Friday 8am -3pm.          °[address] °[place], [place] [address] °336-676-6840 or crisis line- 336-676-6905 ° °[facility]/ Intensive Outpatient Therapy Program °[address] °[place], [place] [address] °336-832-9804 ° °[facility]                  °Crisis Services      °336.641.4993      °[address]     °[place], [place] [address]                ° °[facility]   °[facility] °[phone] °[address] °[place], [place] [address] ° ° °[facility]          °[address],  °[place], [place] [address]       °([phone] ° °[facility] °[address] °[place], [place] [address] °336-292-1510 ° °[facility]    °[address]    °[place], [place] [address]     °336-632-3505      ° °Parish McKinney, MD     °[address]     °[place] [address]     °336-282-1251     °  °[facility] °[address] °[place] [address] ° °[facility]     °[address]     °[place], [place]      °336-542-2076      ° °[facility] °Shamsher Ahluwalia, MD °[address] °[place], [place] [address] °336-420-9558 ° °[facility]     °[address]     °[place], [place] [address]     °336-274-1237      ° °[facility] °[address] °[place], [place] [address] °336-854-4450 °Resources for Temporary Residential Assistance/Crisis Centers ° °DAY CENTERS °[facility] °M-F 8am-3pm   °[address] [place], [place] [address]   336-332-0824 °Services include: laundry, barbering, support groups, case management, phone  & computer access, showers, AA/NA mtgs, mental health/substance abuse nurse, job skills class, disability information, VA assistance, spiritual classes, etc.  ° °HOMELESS SHELTERS ° °[facility]     °[facility]   °[address], [place]     °336.271.5959       °       °[facility] (women and children)       °[address] °[place], [place] [address] °336-275-0820 °[email or web address] for application and process °Application Required ° °[facility]   °[address]    °[place] [address]     °[phone]       °             °[facility] °[address] °, [place] [address] °336.273.5572 °336-235-0363(schedule application appt.) °Application Required ° °[facility] (women only)    °[address]     °[place],  [address]     °336-884-1039      °  Intake starts 6pm daily Need valid ID, SSC, & Police report [facility] [address] [place], [place] [phone] Application Required  [employer] (men only)     [place].      [place], [place]       [facility] (Pregnant women only) [address] [place], [place]  [facility]      [place] [address]      [place] [address]     [phone]             [facility] [address] [place], [place] 90 day commitment/SA/Application process  [facility](men only)     [address]     [place], [place]       Check-in at [facility] [address] [place], [place] [address] [phone] Men/Women/Women and Children must be there by 7 pm  [place], [place]                  Take the prescription as directed.  Call your regular medical doctor tomorrow to schedule a follow up appointment within the next 3 days. Call the mental health resources given to you today to schedule a follow up appointment within the next week.  Return to the Emergency Department immediately sooner if worsening.

## 2017-12-13 NOTE — Telephone Encounter (Signed)
Noted  

## 2017-12-13 NOTE — Telephone Encounter (Signed)
ok 

## 2017-12-13 NOTE — ED Provider Notes (Signed)
[facility] EMERGENCY DEPARTMENT Provider Note   CSN: [id number] Arrival date & time: 12/13/17  1619     History   Chief Complaint Chief Complaint  Patient presents with  . Anxiety    HPI Stephanie Torres is a 22 y.o. female.   Anxiety     Pt was seen at 1630. Per pt, c/o gradual onset and worsening of persistent anxiety for the past 2 days. Pt states she was evaluated by her PMD yesterday and "got restarted back on my meds" (celexa and Klonopin). Pt took her first doses today. Pt describes her anxiety as: "stomach upset, nausea, CP, SOB, whole body shaking, feeling like something bad is going to happen to her." Denies SI, no SA, no HI, no hallucinations. Denies abd pain, no vomiting/diarrhea, no back pain, no rash, no fevers.   Past Medical History:  Diagnosis Date  . Anxiety   . Asthma   . Depression   . DUB (dysfunctional uterine bleeding)   . PCOS (polycystic ovarian syndrome)     Patient Active Problem List   Diagnosis Date Noted  . Suicidal overdose, sequela ([place]) 04/27/2017  . Anxiety and depression 04/27/2017  . Depression 04/18/2016  . Ingrown toenail 04/12/2013  . PCOS (polycystic ovarian syndrome) 04/11/2013  . Hyperinsulinemia 11/21/2012  . Iron deficiency anemia 11/21/2012  . Other and unspecified hyperlipidemia 11/21/2012  . Gastritis 10/31/2012  . Migraine headache without aura 10/31/2012  . Asthma with acute exacerbation 10/31/2012  . Morbid obesity ([place]) 10/31/2012    Past Surgical History:  Procedure Laterality Date  . CESAREAN SECTION    . TONSILLECTOMY       OB History    Gravida  1   Para  1   Term      Preterm      AB      Living        SAB      TAB      Ectopic      Multiple      Live Births               Home Medications    Prior to Admission medications   Medication Sig Start Date End Date Taking? Authorizing Provider  albuterol (PROVENTIL HFA;VENTOLIN HFA) 108 (90 Base) MCG/ACT inhaler Inhale 2 puffs  into the lungs every 6 (six) hours as needed for wheezing or shortness of breath. 10/24/17   Mikey Kirschner, MD  citalopram (CELEXA) 20 MG tablet Take 1 tablet (20 mg total) by mouth daily. 12/12/17   Nilda Simmer, NP  clonazePAM (KLONOPIN) 0.5 MG tablet Take 1/2 - 1 tab po BID prn anxiety 12/12/17   Nilda Simmer, NP  fluticasone (FLOVENT HFA) 110 MCG/ACT inhaler Inhale 1 puff into the lungs 2 (two) times daily. Rinse mouth after each use 04/06/17   Nilda Simmer, NP  ibuprofen (ADVIL,MOTRIN) 600 MG tablet Take 1 tablet (600 mg total) by mouth every 6 (six) hours as needed. Patient not taking: Reported on 12/12/2017 07/13/17   Fransico Meadow, PA-C  predniSONE (DELTASONE) 10 MG tablet Take 4 a day for 3 days; then 3 a day for 3 days; then 2 a day for 2 days Patient not taking: Reported on 12/12/2017 10/24/17   Mikey Kirschner, MD  triamcinolone cream (KENALOG) 0.1 % Apply 1 application topically 2 (two) times daily. Prn rash; use up to 2 weeks Patient not taking: Reported on 12/12/2017 04/06/17   Nilda Simmer, NP  Family History Family History  Problem Relation Age of Onset  . Diabetes Mother   . Hypertension Mother     Social History Social History   Tobacco Use  . Smoking status: Never Smoker  . Smokeless tobacco: Never Used  Substance Use Topics  . Alcohol use: No  . Drug use: No     Allergies   Zoloft [sertraline hcl]   Review of Systems Review of Systems ROS: Statement: All systems negative except as marked or noted in the HPI; Constitutional: Negative for fever and chills. ; ; Eyes: Negative for eye pain, redness and discharge. ; ; ENMT: Negative for ear pain, hoarseness, nasal congestion, sinus pressure and sore throat. ; ; Cardiovascular: Negative for palpitations, diaphoresis, and peripheral edema. ; ; Respiratory: Negative for cough, wheezing and stridor. ; ; Gastrointestinal: Negative for vomiting, diarrhea, abdominal pain, blood in stool,  hematemesis, jaundice and rectal bleeding. . ; ; Genitourinary: Negative for dysuria, flank pain and hematuria. ; ; Musculoskeletal: Negative for back pain and neck pain. Negative for swelling and trauma.; ; Skin: Negative for pruritus, rash, abrasions, blisters, bruising and skin lesion.; ; Neuro: Negative for headache, lightheadedness and neck stiffness. Negative for weakness, altered level of consciousness, altered mental status, extremity weakness, paresthesias, involuntary movement, seizure and syncope.; Psych:  +anxiety. No SI, no SA, no HI, no hallucinations.      Physical Exam Updated Vital Signs BP 131/90   Pulse 100   Temp 98.3 F (36.8 C) (Oral)   Resp 20   Ht 5\' 4"  (1.626 m)   Wt 115.7 kg (255 lb)   SpO2 98%   BMI 43.77 kg/m    BP 112/62   Pulse 88   Temp 98.3 F (36.8 C) (Oral)   Resp 18   Ht 5\' 4"  (1.626 m)   Wt 115.7 kg (255 lb)   SpO2 99%   BMI 43.77 kg/m    Physical Exam 1635: Physical examination:  Nursing notes reviewed; Vital signs and O2 SAT reviewed;  Constitutional: Well developed, Well nourished, Well hydrated, In no acute distress; Head:  Normocephalic, atraumatic; Eyes: EOMI, PERRL, No scleral icterus; ENMT: Mouth and pharynx normal, Mucous membranes moist; Neck: Supple, Full range of motion, No lymphadenopathy; Cardiovascular: Regular rate and rhythm, No gallop; Respiratory: Breath sounds clear & equal bilaterally, No wheezes.  Speaking full sentences with ease, Normal respiratory effort/excursion; Chest: Nontender, Movement normal; Abdomen: Soft, Nontender, Nondistended, Normal bowel sounds; Genitourinary: No CVA tenderness; Extremities: Peripheral pulses normal, No tenderness, No edema, No calf edema or asymmetry.; Neuro: AA&Ox3, Major CN grossly intact.  Speech clear. No gross focal motor or sensory deficits in extremities.; Skin: Color normal, Warm, Dry.; Psych:  Anxious.    ED Treatments / Results  Labs (all labs ordered are listed, but only  abnormal results are displayed)   EKG EKG Interpretation  Date/Time:  Thursday December 13 2017 16:57:26 EDT Ventricular Rate:  90 PR Interval:    QRS Duration: 102 QT Interval:  356 QTC Calculation: 436 R Axis:   84 Text Interpretation:  Sinus rhythm When compared with ECG of 04/16/2017 No significant change was found Confirmed by Francine Graven [phone]) on 12/13/2017 4:59:44 PM   Radiology   Procedures Procedures (including critical care time)  Medications Ordered in ED Medications  hydrOXYzine (ATARAX/VISTARIL) tablet 25 mg (has no administration in time range)     Initial Impression / Assessment and Plan / ED Course  I have reviewed the triage vital signs and the nursing notes.  Pertinent labs & imaging results that were available during my care of the patient were reviewed by me and considered in my medical decision making (see chart for details).  MDM Reviewed: previous chart, nursing note and vitals Reviewed previous: labs and ECG Interpretation: labs and ECG   Results for orders placed or performed during the hospital encounter of 12/13/17  Pregnancy, urine  Result Value Ref Range   Preg Test, Ur NEGATIVE NEGATIVE  Urinalysis, Routine w reflex microscopic  Result Value Ref Range   Color, Urine YELLOW YELLOW   APPearance CLOUDY (A) CLEAR   Specific Gravity, Urine 1.012 1.005 - 1.030   pH 9.0 (H) 5.0 - 8.0   Glucose, UA NEGATIVE NEGATIVE mg/dL   Hgb urine dipstick NEGATIVE NEGATIVE   Bilirubin Urine NEGATIVE NEGATIVE   Ketones, ur NEGATIVE NEGATIVE mg/dL   Protein, ur NEGATIVE NEGATIVE mg/dL   Nitrite NEGATIVE NEGATIVE   Leukocytes, UA NEGATIVE NEGATIVE  Urine rapid drug screen (hosp performed)  Result Value Ref Range   Opiates NONE DETECTED NONE DETECTED   Cocaine NONE DETECTED NONE DETECTED   Benzodiazepines NONE DETECTED NONE DETECTED   Amphetamines NONE DETECTED NONE DETECTED   Tetrahydrocannabinol NONE DETECTED NONE DETECTED   Barbiturates (A)  NONE DETECTED    Result not available. Reagent lot number recalled by manufacturer.  Ethanol  Result Value Ref Range   Alcohol, Ethyl (B) <10 <10 mg/dL  CBC with Differential  Result Value Ref Range   WBC 13.8 (H) 4.0 - 10.5 K/uL   RBC 5.17 (H) 3.87 - 5.11 MIL/uL   Hemoglobin 10.8 (L) 12.0 - 15.0 g/dL   HCT 35.1 (L) 36.0 - 46.0 %   MCV 67.9 (L) 78.0 - 100.0 fL   MCH 20.9 (L) 26.0 - 34.0 pg   MCHC 30.8 30.0 - 36.0 g/dL   RDW 18.2 (H) 11.5 - 15.5 %   Platelets 468 (H) 150 - 400 K/uL   Neutrophils Relative % 63 %   Neutro Abs 8.7 (H) 1.7 - 7.7 K/uL   Lymphocytes Relative 28 %   Lymphs Abs 3.9 0.7 - 4.0 K/uL   Monocytes Relative 6 %   Monocytes Absolute 0.9 0.1 - 1.0 K/uL   Eosinophils Relative 3 %   Eosinophils Absolute 0.4 0.0 - 0.7 K/uL   Basophils Relative 0 %   Basophils Absolute 0.1 0.0 - 0.1 K/uL  Comprehensive metabolic panel  Result Value Ref Range   Sodium 140 135 - 145 mmol/L   Potassium 4.1 3.5 - 5.1 mmol/L   Chloride 105 98 - 111 mmol/L   CO2 29 22 - 32 mmol/L   Glucose, Bld 99 70 - 99 mg/dL   BUN 8 6 - 20 mg/dL   Creatinine, Ser 0.67 0.44 - 1.00 mg/dL   Calcium 8.9 8.9 - 10.3 mg/dL   Total Protein 7.4 6.5 - 8.1 g/dL   Albumin 3.6 3.5 - 5.0 g/dL   AST 17 15 - 41 U/L   ALT 15 0 - 44 U/L   Alkaline Phosphatase 65 38 - 126 U/L   Total Bilirubin 1.0 0.3 - 1.2 mg/dL   GFR calc non Af Amer >60 >60 mL/min   GFR calc Af Amer >60 >60 mL/min   Anion gap 6 5 - 15  Lipase, blood  Result Value Ref Range   Lipase 29 11 - 51 U/L  Troponin I  Result Value Ref Range   Troponin I <0.03 <0.03 ng/mL    1955:  Pt calmer. Workup  reassuring. Doubt PE with PERC negative and low risk Wells'. Tx symptomatically, f/u PMD and mental health. Dx and testing d/w pt and family.  Questions answered.  Verb understanding, agreeable to d/c home with outpt f/u.    Final Clinical Impressions(s) / ED Diagnoses   Final diagnoses:  None    ED Discharge Orders    None         Francine Graven, DO 12/16/17 1728

## 2017-12-14 ENCOUNTER — Encounter: Payer: Self-pay | Admitting: Nurse Practitioner

## 2017-12-14 NOTE — Progress Notes (Signed)
Subjective: Presents for recheck on her anxiety and depression.  Her mother is present per her request.  Patient was hospitalized at one point for her mental health issues.  Was doing well.  In counseling.  For some reason which she is not sure of her Celexa was stopped.  Has had a steady increase in her anxiety and panic attacks since then.  Denies any suicidal or homicidal thoughts or ideation. GAD 7 : Generalized Anxiety Score 12/14/2017 04/06/2017  Nervous, Anxious, on Edge 3 2  Control/stop worrying 3 3  Worry too much - different things 3 3  Trouble relaxing 3 3  Restless 3 2  Easily annoyed or irritable 3 3  Afraid - awful might happen 3 3  Total GAD 7 Score 21 19  Anxiety Difficulty Somewhat difficult Very difficult    Depression screen [facility] 2/9 12/14/2017 04/25/2017 04/06/2017 04/06/2017  Decreased Interest 0 0 3 3  Down, Depressed, Hopeless 0 1 3 3   PHQ - 2 Score 0 1 6 6   Altered sleeping 3 0 2 2  Tired, decreased energy 1 0 3 3  Change in appetite 1 1 2 2   Feeling bad or failure about yourself  0 2 2 2   Trouble concentrating 1 0 0 0  Moving slowly or fidgety/restless 1 1 1 1   Suicidal thoughts 0 0 1 1  PHQ-9 Score 7 5 17 17   Difficult doing work/chores Somewhat difficult Very difficult Very difficult -     Objective:   BP 112/74   Ht 5' 3.5" (1.613 m)   Wt 255 lb 6.4 oz (115.8 kg)   BMI 44.53 kg/m  NAD.  Alert, oriented.  Mildly anxious affect.  Thoughts logical coherent and relevant.  Dressed appropriately.  Making good eye contact.  Lungs clear.  Heart regular rate and rhythm.  Assessment:   Problem List Items Addressed This Visit      Other   Anxiety and depression - Primary   Relevant Medications   citalopram (CELEXA) 20 MG tablet       Plan:   Meds ordered this encounter  Medications  . citalopram (CELEXA) 20 MG tablet    Sig: Take 1 tablet (20 mg total) by mouth daily.    Dispense:  30 tablet    Refill:  2    Order Specific Question:   Supervising  Provider    Answer:   Mikey Kirschner [[id number]]  . clonazePAM (KLONOPIN) 0.5 MG tablet    Sig: Take 1/2 - 1 tab po BID prn anxiety    Dispense:  30 tablet    Refill:  2    May refill monthly    Order Specific Question:   Supervising Provider    Answer:   Mikey Kirschner [[id number]]   Restart Celexa as directed.  Call back in a few weeks if dosage needs to be increased.  Use Klonopin sparingly for panic attacks or extreme anxiety.  Seek help immediately if any severe symptoms including suicidal or homicidal thoughts. Return in about 3 months (around 03/14/2018) for recheck. Call back sooner if needed.

## 2018-01-04 ENCOUNTER — Ambulatory Visit (INDEPENDENT_AMBULATORY_CARE_PROVIDER_SITE_OTHER): Payer: BLUE CROSS/BLUE SHIELD | Admitting: Family Medicine

## 2018-01-04 ENCOUNTER — Encounter: Payer: Self-pay | Admitting: Family Medicine

## 2018-01-04 VITALS — BP 112/98 | Temp 99.2°F | Ht 63.5 in | Wt 251.1 lb

## 2018-01-04 DIAGNOSIS — J4521 Mild intermittent asthma with (acute) exacerbation: Secondary | ICD-10-CM

## 2018-01-04 DIAGNOSIS — R05 Cough: Secondary | ICD-10-CM | POA: Diagnosis not present

## 2018-01-04 DIAGNOSIS — J019 Acute sinusitis, unspecified: Secondary | ICD-10-CM

## 2018-01-04 MED ORDER — AMOXICILLIN 500 MG PO TABS: 500.0000 mg | ORAL_TABLET | Freq: Three times a day (TID) | ORAL | 0 refills | Status: DC

## 2018-01-04 MED ORDER — ALBUTEROL SULFATE (2.5 MG/3ML) 0.083% IN NEBU
2.5000 mg | INHALATION_SOLUTION | Freq: Once | RESPIRATORY_TRACT | Status: AC
  Administered 2018-01-04: 2.5 mg via RESPIRATORY_TRACT

## 2018-01-04 MED ORDER — METHYLPREDNISOLONE ACETATE 40 MG/ML IJ SUSP
40.0000 mg | Freq: Once | INTRAMUSCULAR | Status: AC
  Administered 2018-01-04: 40 mg via INTRAMUSCULAR

## 2018-01-04 MED ORDER — PREDNISONE 20 MG PO TABS: ORAL_TABLET | ORAL | 0 refills | Status: DC

## 2018-01-04 NOTE — Progress Notes (Signed)
   Subjective:    Patient ID: Stephanie Torres, female    DOB: 1996-04-02, 22 y.o.   MRN: [id number]  HPI  Patient is here today with complaints of a cough,wheezing and sore throat, chest congestion,shortness of breath, body aches, vomiting,diarrhea,runny nose,bilateral ear pain,abd pain ongoing for the last three days. She has taken robitussin, vapor rub on her chest, throat spray, inhaler. Severe coughing congestion shortness of breath wheezing relates symptoms been present past several days had asthma as a young child does not smoke or drink denies high fever chills PMH benign Review of Systems  Constitutional: Negative for activity change and fever.  HENT: Positive for congestion and rhinorrhea. Negative for ear pain.   Eyes: Negative for discharge.  Respiratory: Positive for cough. Negative for shortness of breath and wheezing.   Cardiovascular: Negative for chest pain.       Objective:   Physical Exam  Constitutional: She appears well-developed.  HENT:  Head: Normocephalic.  Nose: Nose normal.  Mouth/Throat: Oropharynx is clear and moist. No oropharyngeal exudate.  Neck: Neck supple.  Cardiovascular: Normal rate and normal heart sounds.  No murmur heard. Pulmonary/Chest: Effort normal. She has wheezes.  Lymphadenopathy:    She has no cervical adenopathy.  Skin: Skin is warm and dry.  Nursing note and vitals reviewed.    Neb treatment given some improvement she felt dizzy with it O2 sats are good     Assessment & Plan:  Asthma flareup Depo-Medrol Prednisone Antibiotic to cover for any secondary infection Warning signs were discussed If worse go to ER Patient not in distress afterwards should be able to go home and gradually get better if she takes it easy over the next few days Patient voiced understanding

## 2018-02-11 ENCOUNTER — Telehealth: Payer: Self-pay | Admitting: Family Medicine

## 2018-02-11 NOTE — Telephone Encounter (Signed)
The difficult part is there is nothing that would help this go away any quicker short course of anti-inflammatory such as ibuprofen might be helpful If any of the areas look infected it would be wise to follow-up

## 2018-02-11 NOTE — Telephone Encounter (Signed)
Pt went to tan at [employer] 2 days ago, was in there for 15 minutes  States she has really bad sunburn from her shoulders all the way down to the back of her legs, blisters  Having chills  Wonders what we can suggest that she do for some relief? Please advise & call pt   [facility]

## 2018-02-11 NOTE — Telephone Encounter (Signed)
Patient advised per Dr Nicki Reaper : he difficult part is there is nothing that would help this go away any quicker short course of anti-inflammatory such as ibuprofen might be helpful If any of the areas look infected it would be wise to follow-up.  Patient verbalized understanding.

## 2018-02-20 ENCOUNTER — Other Ambulatory Visit: Payer: Self-pay

## 2018-02-21 ENCOUNTER — Ambulatory Visit: Payer: BLUE CROSS/BLUE SHIELD

## 2018-02-21 MED ORDER — CITALOPRAM HYDROBROMIDE 20 MG PO TABS: 20.0000 mg | ORAL_TABLET | Freq: Every day | ORAL | 2 refills | Status: DC

## 2018-02-27 ENCOUNTER — Ambulatory Visit (INDEPENDENT_AMBULATORY_CARE_PROVIDER_SITE_OTHER): Payer: BLUE CROSS/BLUE SHIELD

## 2018-02-27 DIAGNOSIS — Z3042 Encounter for surveillance of injectable contraceptive: Secondary | ICD-10-CM | POA: Diagnosis not present

## 2018-02-27 DIAGNOSIS — Z111 Encounter for screening for respiratory tuberculosis: Secondary | ICD-10-CM | POA: Diagnosis not present

## 2018-02-27 MED ORDER — MEDROXYPROGESTERONE ACETATE 150 MG/ML IM SUSP
150.0000 mg | Freq: Once | INTRAMUSCULAR | Status: AC
  Administered 2018-02-27: 150 mg via INTRAMUSCULAR

## 2018-03-01 ENCOUNTER — Ambulatory Visit (INDEPENDENT_AMBULATORY_CARE_PROVIDER_SITE_OTHER): Payer: BLUE CROSS/BLUE SHIELD | Admitting: Family Medicine

## 2018-03-01 ENCOUNTER — Encounter: Payer: Self-pay | Admitting: Family Medicine

## 2018-03-01 VITALS — Temp 98.1°F | Ht 63.5 in | Wt 258.4 lb

## 2018-03-01 DIAGNOSIS — B9689 Other specified bacterial agents as the cause of diseases classified elsewhere: Secondary | ICD-10-CM

## 2018-03-01 DIAGNOSIS — J019 Acute sinusitis, unspecified: Secondary | ICD-10-CM | POA: Diagnosis not present

## 2018-03-01 DIAGNOSIS — J4521 Mild intermittent asthma with (acute) exacerbation: Secondary | ICD-10-CM | POA: Diagnosis not present

## 2018-03-01 LAB — TB SKIN TEST
Induration: 0 mm
TB Skin Test: NEGATIVE

## 2018-03-01 MED ORDER — BUDESONIDE-FORMOTEROL FUMARATE 80-4.5 MCG/ACT IN AERO: 2.0000 | INHALATION_SPRAY | Freq: Two times a day (BID) | RESPIRATORY_TRACT | 3 refills | Status: DC

## 2018-03-01 MED ORDER — PREDNISONE 20 MG PO TABS: ORAL_TABLET | ORAL | 0 refills | Status: DC

## 2018-03-01 MED ORDER — ALBUTEROL SULFATE HFA 108 (90 BASE) MCG/ACT IN AERS: 2.0000 | INHALATION_SPRAY | Freq: Four times a day (QID) | RESPIRATORY_TRACT | 2 refills | Status: DC | PRN

## 2018-03-01 NOTE — Progress Notes (Signed)
   Subjective:    Patient ID: Stephanie Torres, female    DOB: 12-21-1995, 22 y.o.   MRN: [id number]  Cough  This is a new problem. The current episode started in the past 7 days. Associated symptoms include ear pain, a fever, headaches, nasal congestion, rhinorrhea and wheezing. Pertinent negatives include no chest pain or shortness of breath. Treatments tried: robitussin, benadryl, allegra.   Viral-like illness Present for several days Now causing head congestion sinus pressure coughing Not feeling good Energy level subpar PMH benign Patient has intermittent asthma flareups Primarily this is cough and wheezing it does not happen frequently But when it does it causes her trouble She is not using a steroid inhaler currently    Review of Systems  Constitutional: Positive for fever. Negative for activity change.  HENT: Positive for congestion, ear pain and rhinorrhea.   Eyes: Negative for discharge.  Respiratory: Positive for cough and wheezing. Negative for shortness of breath.   Cardiovascular: Negative for chest pain.  Neurological: Positive for headaches.       Objective:   Physical Exam  Constitutional: She appears well-developed.  HENT:  Head: Normocephalic.  Nose: Nose normal.  Mouth/Throat: Oropharynx is clear and moist. No oropharyngeal exudate.  Neck: Neck supple.  Cardiovascular: Normal rate and normal heart sounds.  No murmur heard. Pulmonary/Chest: Effort normal and breath sounds normal. She has no wheezes.  Lymphadenopathy:    She has no cervical adenopathy.  Skin: Skin is warm and dry.  Nursing note and vitals reviewed.         Assessment & Plan:  Viral syndrome Asthma flareup Secondary sinusitis Antibiotics prescribed warning signs discussed Prednisone taper Albuterol as needed Warning signs discussed Patient to use Symbicort during times of flareup I instructed her when she starts having first signs of a cold asthmatic cough or wheezing to  start using the Symbicort on a frequent basis 2 puffs twice daily Patient was told that she may need more aggressive approach if ongoing symptoms or flareups Also recommend the patient do a follow-up visit within 1 month's time Follow-up sooner problems

## 2018-03-14 ENCOUNTER — Ambulatory Visit: Payer: BLUE CROSS/BLUE SHIELD | Admitting: Family Medicine

## 2018-03-17 ENCOUNTER — Emergency Department (HOSPITAL_COMMUNITY)
Admission: EM | Admit: 2018-03-17 | Discharge: 2018-03-17 | Disposition: A | Discharge status: Home / Self Care | Payer: BLUE CROSS/BLUE SHIELD | Attending: Emergency Medicine | Admitting: Emergency Medicine

## 2018-03-17 ENCOUNTER — Encounter (HOSPITAL_COMMUNITY): Payer: Self-pay | Admitting: Emergency Medicine

## 2018-03-17 ENCOUNTER — Other Ambulatory Visit: Payer: Self-pay

## 2018-03-17 DIAGNOSIS — J45909 Unspecified asthma, uncomplicated: Secondary | ICD-10-CM | POA: Insufficient documentation

## 2018-03-17 DIAGNOSIS — R51 Headache: Secondary | ICD-10-CM | POA: Diagnosis not present

## 2018-03-17 DIAGNOSIS — G43909 Migraine, unspecified, not intractable, without status migrainosus: Secondary | ICD-10-CM | POA: Diagnosis not present

## 2018-03-17 DIAGNOSIS — Z79899 Other long term (current) drug therapy: Secondary | ICD-10-CM | POA: Insufficient documentation

## 2018-03-17 DIAGNOSIS — R519 Headache, unspecified: Secondary | ICD-10-CM

## 2018-03-17 MED ORDER — SODIUM CHLORIDE 0.9 % IV BOLUS
1000.0000 mL | Freq: Once | INTRAVENOUS | Status: AC
  Administered 2018-03-17: 1000 mL via INTRAVENOUS

## 2018-03-17 MED ORDER — KETOROLAC TROMETHAMINE 30 MG/ML IJ SOLN
15.0000 mg | Freq: Once | INTRAMUSCULAR | Status: AC
  Administered 2018-03-17: 15 mg via INTRAVENOUS
  Filled 2018-03-17: qty 1

## 2018-03-17 MED ORDER — DIPHENHYDRAMINE HCL 50 MG/ML IJ SOLN
25.0000 mg | Freq: Once | INTRAMUSCULAR | Status: AC
  Administered 2018-03-17: 25 mg via INTRAVENOUS
  Filled 2018-03-17: qty 1

## 2018-03-17 MED ORDER — METOCLOPRAMIDE HCL 5 MG/ML IJ SOLN
10.0000 mg | Freq: Once | INTRAMUSCULAR | Status: AC
  Administered 2018-03-17: 10 mg via INTRAVENOUS
  Filled 2018-03-17: qty 2

## 2018-03-17 NOTE — ED Notes (Signed)
ED Provider at bedside. 

## 2018-03-17 NOTE — ED Triage Notes (Signed)
Pt c/o migraine HA x 3 days with nausea and photosensitivity. No hx of migraines.

## 2018-03-17 NOTE — ED Provider Notes (Signed)
[facility] EMERGENCY DEPARTMENT Provider Note   CSN: [id number] Arrival date & time: 03/17/18  1735     History   Chief Complaint Chief Complaint  Patient presents with  . Migraine    HPI Stephanie Torres is a 22 y.o. female.  HPI   22 year old female with headache.  Gradual onset 3 days ago.  Persistent since then.  Describes the pain is diffuse and "just hurts."  Associate with nausea.  No vomiting.  Photosensitivity.  Denies any signficant past headache history.  She is tried various NSAIDs without much improvement.  No fevers or chills.  Past Medical History:  Diagnosis Date  . Anxiety   . Asthma   . Depression   . DUB (dysfunctional uterine bleeding)   . PCOS (polycystic ovarian syndrome)     Patient Active Problem List   Diagnosis Date Noted  . Suicidal overdose, sequela ([place]) 04/27/2017  . Anxiety and depression 04/27/2017  . Depression 04/18/2016  . Ingrown toenail 04/12/2013  . PCOS (polycystic ovarian syndrome) 04/11/2013  . Hyperinsulinemia 11/21/2012  . Iron deficiency anemia 11/21/2012  . Other and unspecified hyperlipidemia 11/21/2012  . Gastritis 10/31/2012  . Migraine headache without aura 10/31/2012  . Asthma with acute exacerbation 10/31/2012  . Morbid obesity ([place]) 10/31/2012    Past Surgical History:  Procedure Laterality Date  . CESAREAN SECTION    . TONSILLECTOMY       OB History    Gravida  1   Para  1   Term      Preterm      AB      Living        SAB      TAB      Ectopic      Multiple      Live Births               Home Medications    Prior to Admission medications   Medication Sig Start Date End Date Taking? Authorizing Provider  albuterol (PROVENTIL HFA;VENTOLIN HFA) 108 (90 Base) MCG/ACT inhaler Inhale 2 puffs into the lungs every 6 (six) hours as needed for wheezing or shortness of breath. 03/01/18  Yes Luking, Elayne Snare, MD  Aspirin-Acetaminophen-Caffeine (GOODY HEADACHE PO) Take 1-2 Packages by  mouth daily as needed (for headache).   Yes [provider]  citalopram (CELEXA) 20 MG tablet Take 1 tablet (20 mg total) by mouth daily. Patient taking differently: Take 20 mg by mouth at bedtime.  02/21/18  Yes Mikey Kirschner, MD  ibuprofen (ADVIL,MOTRIN) 200 MG tablet Take 200 mg by mouth every 6 (six) hours as needed for mild pain or moderate pain.   Yes [provider]  medroxyPROGESTERone (DEPO-PROVERA) 150 MG/ML injection Inject 150 mg into the muscle every 3 (three) months.   Yes [provider]  naproxen sodium (ALEVE) 220 MG tablet Take 220 mg by mouth 2 (two) times daily as needed (for pain).   Yes [provider]  budesonide-formoterol (SYMBICORT) 80-4.5 MCG/ACT inhaler Inhale 2 puffs into the lungs 2 (two) times daily. Patient not taking: Reported on 03/17/2018 03/01/18   Kathyrn Drown, MD  clonazePAM (KLONOPIN) 0.5 MG tablet Take 1/2 - 1 tab po BID prn anxiety Patient not taking: Reported on 03/17/2018 12/12/17   Nilda Simmer, NP    Family History Family History  Problem Relation Age of Onset  . Diabetes Mother   . Hypertension Mother     Social History Social History  Tobacco Use  . Smoking status: Never Smoker  . Smokeless tobacco: Never Used  Substance Use Topics  . Alcohol use: No  . Drug use: No     Allergies   Zoloft [sertraline hcl]   Review of Systems Review of Systems  All systems reviewed and negative, other than as noted in HPI.  Physical Exam Updated Vital Signs BP (!) 141/76 (BP Location: Right Arm)   Pulse 94   Temp 98.5 F (36.9 C) (Oral)   Resp 20   Ht 5' 3.5" (1.613 m)   Wt 113.4 kg   SpO2 100%   BMI 43.59 kg/m   Physical Exam  Constitutional: She is oriented to person, place, and time. She appears well-developed and well-nourished. No distress.  HENT:  Head: Normocephalic and atraumatic.  Eyes: Pupils are equal, round, and reactive to light. Conjunctivae and EOM are normal. Right eye  exhibits no discharge. Left eye exhibits no discharge.  Neck: Neck supple.  No nuchal rigidity  Cardiovascular: Normal rate, regular rhythm and normal heart sounds. Exam reveals no gallop and no friction rub.  No murmur heard. Pulmonary/Chest: Effort normal and breath sounds normal. No respiratory distress.  Abdominal: Soft. She exhibits no distension. There is no tenderness.  Musculoskeletal: She exhibits no edema or tenderness.  Neurological: She is alert and oriented to person, place, and time. No cranial nerve deficit. She exhibits normal muscle tone. Coordination normal.  Skin: Skin is warm and dry.  Psychiatric: She has a normal mood and affect. Her behavior is normal. Thought content normal.  Nursing note and vitals reviewed.    ED Treatments / Results  Labs (all labs ordered are listed, but only abnormal results are displayed) Labs Reviewed - No data to display  EKG None  Radiology No results found.  Procedures Procedures (including critical care time)  Medications Ordered in ED Medications  sodium chloride 0.9 % bolus 1,000 mL (has no administration in time range)  ketorolac (TORADOL) 30 MG/ML injection 15 mg (has no administration in time range)  metoCLOPramide (REGLAN) injection 10 mg (has no administration in time range)  diphenhydrAMINE (BENADRYL) injection 25 mg (has no administration in time range)     Initial Impression / Assessment and Plan / ED Course  I have reviewed the triage vital signs and the nursing notes.  Pertinent labs & imaging results that were available during my care of the patient were reviewed by me and considered in my medical decision making (see chart for details).     22 year old female with headache.  No particular concerning "red flags."  No trauma.  Afebrile.  No nuchal rigidity.  Nonfocal neurological examination.  She denies any Szymon past headache history other past medical history does list migraines.  Regardless, I doubt  emergent underlying etiology.  We will treat her symptoms and reassess.  Feeling much better. No new complaints.  Final Clinical Impressions(s) / ED Diagnoses   Final diagnoses:  Bad headache    ED Discharge Orders    None       [name], MD 03/27/18 1419

## 2018-03-26 ENCOUNTER — Telehealth: Payer: Self-pay | Admitting: Family Medicine

## 2018-03-26 MED ORDER — CITALOPRAM HYDROBROMIDE 20 MG PO TABS: 20.0000 mg | ORAL_TABLET | Freq: Every day | ORAL | 0 refills | Status: DC

## 2018-03-26 NOTE — Telephone Encounter (Signed)
Please advise. Thank you

## 2018-03-26 NOTE — Telephone Encounter (Signed)
Pt no showed appt on 03-14-18, now calling to reschedule.  Made appt for 10-29 but said she will run out of medicine before then and would like a refill on Celexa.  [facility]

## 2018-03-26 NOTE — Telephone Encounter (Signed)
30 d ok

## 2018-03-26 NOTE — Telephone Encounter (Signed)
Patient is aware medication sent to Stephanie Torres as requested by pt.

## 2018-04-02 ENCOUNTER — Ambulatory Visit (INDEPENDENT_AMBULATORY_CARE_PROVIDER_SITE_OTHER): Payer: BLUE CROSS/BLUE SHIELD | Admitting: Family Medicine

## 2018-04-02 ENCOUNTER — Encounter: Payer: Self-pay | Admitting: Family Medicine

## 2018-04-02 VITALS — BP 122/82 | Ht 63.5 in | Wt 263.0 lb

## 2018-04-02 DIAGNOSIS — F329 Major depressive disorder, single episode, unspecified: Secondary | ICD-10-CM

## 2018-04-02 DIAGNOSIS — F32A Depression, unspecified: Secondary | ICD-10-CM

## 2018-04-02 MED ORDER — CITALOPRAM HYDROBROMIDE 20 MG PO TABS: 20.0000 mg | ORAL_TABLET | Freq: Every day | ORAL | 2 refills | Status: DC

## 2018-04-02 NOTE — Progress Notes (Signed)
   Subjective:    Patient ID: Stephanie Torres, female    DOB: August 06, 1995, 22 y.o.   MRN: [id number]  HPI Patient arrives for a follow up on depression. Patient states she is doing pretty good on her Celexa. Patient also states she has been having alot of migraines lately and even went to the ER recently for a migraine.  Taking celexa everyday. Started celexa back in July. Still having significant symptoms. Denies SI or plan for hurting herself. Reports increase in short temper lately. Having issues with sleeping at night, trouble going to sleep and trouble staying asleep, gets up 3 times per night typically. [facility] getting [facility] year but did not find it to be helpful, did not like that particular counselor.   Feels like she has a different mood everyday. Feels like moods are very intense. States she is fidgity and says sometimes wakes up and cleans the house early in the morning.  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 Has started going to [employer] with her mom, go 2-3 times per week. Currently living with her dad, boyfriend, and daughter; feels safe at home.   Review of Systems  Psychiatric/Behavioral: Positive for dysphoric mood and sleep disturbance. Negative for self-injury and suicidal ideas. The patient is nervous/anxious.        Objective:   Physical Exam  Constitutional: She is oriented to person, place, and time. She appears well-developed and well-nourished.  HENT:  Head: Normocephalic and atraumatic.  Cardiovascular: Normal rate, regular rhythm and normal heart sounds.  Pulmonary/Chest: Effort normal and breath sounds normal. No respiratory distress.  Neurological: She is alert and oriented to person, place, and time.  Skin: Skin is warm and dry.  Psychiatric: Her speech is normal. Her  mood appears anxious. She exhibits a depressed mood.  tearful  Nursing note and vitals reviewed.      Assessment & Plan:  Depression, unspecified depression type - Plan: Ambulatory referral to Psychiatry  Pt tearful, hx of suicidal attempt last year, denies SI at this time, discussed that she needs to go to the ED immediately if she begins having suicidal or homicidal thoughts or plans, pt verbalized agreement. Will refer to mental health specialist to take over her mental health care at this time. Will refill celexa to last her until appt can be made with psychiatry. Strongly feel that it is in the patient's best interest for this to be managed by a specialist.   H/A no red flags, recently seen in ED. Given h/a diary. Will f/u in the next month or so for further evaluation of h/a.   Dr. Mickie Hillier was consulted on this case, he also spoke with the patient, and is in agreement with the above treatment plan.

## 2018-04-03 ENCOUNTER — Encounter: Payer: Self-pay | Admitting: Family Medicine

## 2018-05-06 ENCOUNTER — Ambulatory Visit (INDEPENDENT_AMBULATORY_CARE_PROVIDER_SITE_OTHER): Payer: BLUE CROSS/BLUE SHIELD | Admitting: Family Medicine

## 2018-05-06 ENCOUNTER — Encounter: Payer: Self-pay | Admitting: Family Medicine

## 2018-05-06 VITALS — BP 120/76 | Ht 63.5 in | Wt 266.0 lb

## 2018-05-06 DIAGNOSIS — R51 Headache: Secondary | ICD-10-CM | POA: Diagnosis not present

## 2018-05-06 DIAGNOSIS — R519 Headache, unspecified: Secondary | ICD-10-CM

## 2018-05-06 DIAGNOSIS — G4452 New daily persistent headache (NDPH): Secondary | ICD-10-CM | POA: Diagnosis not present

## 2018-05-06 NOTE — Progress Notes (Addendum)
   Subjective:    Patient ID: Stephanie Torres, female    DOB: 01/20/1996, 22 y.o.   MRN: [id number]  Headache   Chronicity: follow up  The current episode started more than 1 month ago. The problem occurs intermittently. Associated symptoms include photophobia. Pertinent negatives include no dizziness, eye pain, nausea, numbness, vomiting or weakness.   Pt states that her headaches come and go. Pt states that her headaches are not as bad. Pt states her headaches only come on when her anxiety is getting up or gets annoyed.   Reports h/a's have improved, but reports h/a daily. H/a pain typically around left or right temple, described as throbbing. Reports photosensitivity. Denies visual changes. Denies N/V. Pt reports h/a is constant but it is tolerable. Reports h/a worse when her mood escalates. Reports excedrin effective for a short time, only taking when h/a is severe, takes 2 tablets.   Reports occasionally waking up in middle of the night or first thing in the morning with a h/a, pt reports occurs about 3/7 days.   Past hx of migraines per patient.   Past Medical History:  Diagnosis Date  . Anxiety   . Asthma   . Depression   . DUB (dysfunctional uterine bleeding)   . PCOS (polycystic ovarian syndrome)    Allergies  Allergen Reactions  . Zoloft [Sertraline Hcl] Other (See Comments)    Suicidal thoughts or ideation     Review of Systems  Constitutional: Negative for activity change, appetite change and unexpected weight change.  Eyes: Positive for photophobia. Negative for pain and visual disturbance.  Gastrointestinal: Negative for nausea and vomiting.  Neurological: Positive for headaches. Negative for dizziness, weakness and numbness.       Objective:   Physical Exam  Constitutional: She is oriented to person, place, and time. She appears well-developed and well-nourished. No distress.  HENT:  Head: Normocephalic and atraumatic.  Eyes: Pupils are equal, round, and  reactive to light. EOM are normal.  Neck: Normal range of motion. Neck supple. No neck rigidity.  Cardiovascular: Normal rate, regular rhythm and normal heart sounds.  No murmur heard. Pulmonary/Chest: Effort normal and breath sounds normal. No respiratory distress.  Neurological: She is alert and oriented to person, place, and time. She has normal strength. She displays a negative Romberg sign. Coordination and gait normal.  Skin: Skin is warm and dry.  Psychiatric: She has a normal mood and affect.  Nursing note and vitals reviewed.     Assessment & Plan:  New daily persistent headache - Plan: Ambulatory referral to Neurology  Nonintractable headache, unspecified chronicity pattern, unspecified headache type - Plan: MR Brain Wo Contrast, Ambulatory referral to Neurology  Pt with daily persistent h/a with nighttime awakenings over the last 1-2 months. Recommend MRI and referral to h/a specialist. Doubt serious etiology is likely, but feel an MRI is warranted based on her symptoms. Discussed starting preventive daily medication like topamax, patient would like to hold off on this for now. Will take excedrin as needed. Will f/u with her based on her results. Warning signs discussed.   Dr. Sallee Lange was consulted on this case and is in agreement with the above treatment plan.

## 2018-05-07 ENCOUNTER — Ambulatory Visit: Payer: BLUE CROSS/BLUE SHIELD

## 2018-05-10 ENCOUNTER — Ambulatory Visit (INDEPENDENT_AMBULATORY_CARE_PROVIDER_SITE_OTHER): Payer: BLUE CROSS/BLUE SHIELD

## 2018-05-10 DIAGNOSIS — Z23 Encounter for immunization: Secondary | ICD-10-CM | POA: Diagnosis not present

## 2018-05-15 ENCOUNTER — Ambulatory Visit: Payer: BLUE CROSS/BLUE SHIELD

## 2018-05-17 ENCOUNTER — Ambulatory Visit (INDEPENDENT_AMBULATORY_CARE_PROVIDER_SITE_OTHER): Payer: BLUE CROSS/BLUE SHIELD | Admitting: *Deleted

## 2018-05-17 ENCOUNTER — Encounter: Payer: Self-pay | Admitting: Family Medicine

## 2018-05-17 DIAGNOSIS — Z3042 Encounter for surveillance of injectable contraceptive: Secondary | ICD-10-CM

## 2018-05-17 MED ORDER — MEDROXYPROGESTERONE ACETATE 150 MG/ML IM SUSP
150.0000 mg | Freq: Once | INTRAMUSCULAR | Status: AC
  Administered 2018-05-17: 150 mg via INTRAMUSCULAR

## 2018-05-22 ENCOUNTER — Ambulatory Visit: Payer: BLUE CROSS/BLUE SHIELD

## 2018-06-11 DIAGNOSIS — R51 Headache: Secondary | ICD-10-CM | POA: Diagnosis not present

## 2018-06-12 ENCOUNTER — Encounter: Payer: Self-pay | Admitting: Family Medicine

## 2018-06-12 ENCOUNTER — Telehealth: Payer: Self-pay | Admitting: Family Medicine

## 2018-06-12 NOTE — Telephone Encounter (Signed)
Patient returned call and verbalized understanding.

## 2018-06-12 NOTE — Telephone Encounter (Signed)
Left message to return call 

## 2018-06-12 NOTE — Telephone Encounter (Signed)
Please call patient and let her know MRI of brain was normal which is great news. Still recommend she f/u with neurology referral for her persistent headaches.

## 2018-06-14 ENCOUNTER — Encounter: Payer: Self-pay | Admitting: Neurology

## 2018-07-10 ENCOUNTER — Encounter: Payer: Self-pay | Admitting: Family Medicine

## 2018-07-10 ENCOUNTER — Ambulatory Visit (INDEPENDENT_AMBULATORY_CARE_PROVIDER_SITE_OTHER): Payer: BLUE CROSS/BLUE SHIELD | Admitting: Family Medicine

## 2018-07-10 VITALS — Temp 98.0°F | Ht 63.5 in | Wt 267.4 lb

## 2018-07-10 DIAGNOSIS — R05 Cough: Secondary | ICD-10-CM | POA: Diagnosis not present

## 2018-07-10 DIAGNOSIS — J111 Influenza due to unidentified influenza virus with other respiratory manifestations: Secondary | ICD-10-CM | POA: Diagnosis not present

## 2018-07-10 DIAGNOSIS — R062 Wheezing: Secondary | ICD-10-CM | POA: Diagnosis not present

## 2018-07-10 MED ORDER — PREDNISONE 20 MG PO TABS: ORAL_TABLET | ORAL | 0 refills | Status: DC

## 2018-07-10 MED ORDER — ALBUTEROL SULFATE (2.5 MG/3ML) 0.083% IN NEBU: 2.5000 mg | INHALATION_SOLUTION | Freq: Four times a day (QID) | RESPIRATORY_TRACT | 0 refills | Status: DC | PRN

## 2018-07-10 MED ORDER — ALBUTEROL SULFATE (2.5 MG/3ML) 0.083% IN NEBU
2.5000 mg | INHALATION_SOLUTION | Freq: Once | RESPIRATORY_TRACT | Status: AC
  Administered 2018-07-10: 2.5 mg via RESPIRATORY_TRACT

## 2018-07-10 MED ORDER — OSELTAMIVIR PHOSPHATE 75 MG PO CAPS: 75.0000 mg | ORAL_CAPSULE | Freq: Two times a day (BID) | ORAL | 0 refills | Status: DC

## 2018-07-10 NOTE — Progress Notes (Signed)
   Subjective:    Patient ID: Stephanie Torres, female    DOB: June 24, 1995, 23 y.o.   MRN: [id number]  Cough  This is a new problem. The current episode started in the past 7 days. Associated symptoms include headaches, nasal congestion, a sore throat and wheezing. Associated symptoms comments: Abdominal pain, vomiting, diarrhea. Treatments tried: robitussin, mucinex.    Terrible sneeezing  Throat got sore   Bad diarrhea and vomiting  Trouble walking and talking   Aching all over  Results for orders placed or performed in visit on 02/27/18  PPD  Result Value Ref Range   TB Skin Test Negative    Induration 0.0 mm     Energy zero   Appetite   Got a flu shthis yr   Review of Systems  HENT: Positive for sore throat.   Respiratory: Positive for cough and wheezing.   Neurological: Positive for headaches.       Objective:   Physical Exam  Alert active moderate malaise.  HEENT mild nasal congestion lungs bilateral wheezes no tachypnea heart regular rhythm  O2 sat 96%  Nebulizer treatment given      Assessment & Plan:  Impression influenza plus exacerbation of asthma plan prednisone taper.  Tamiflu twice daily.  Utilize nebulizer.  Warning signs discussed.

## 2018-08-07 ENCOUNTER — Ambulatory Visit (INDEPENDENT_AMBULATORY_CARE_PROVIDER_SITE_OTHER): Payer: BLUE CROSS/BLUE SHIELD | Admitting: *Deleted

## 2018-08-07 DIAGNOSIS — Z3042 Encounter for surveillance of injectable contraceptive: Secondary | ICD-10-CM | POA: Diagnosis not present

## 2018-08-07 MED ORDER — MEDROXYPROGESTERONE ACETATE 150 MG/ML IM SUSP
150.0000 mg | Freq: Once | INTRAMUSCULAR | Status: AC
  Administered 2018-08-07: 150 mg via INTRAMUSCULAR

## 2018-08-20 NOTE — Progress Notes (Deleted)
NEUROLOGY CONSULTATION NOTE  MYKEL MOHL MRN: [id number] DOB: [birth year]  Referring provider: Cheyenne Adas, NP Primary care provider: Mikey Kirschner, MD  Reason for consult:  headache  HISTORY OF PRESENT ILLNESS: Stephanie Torres is a 23 year old ***-handed Caucasian woman who presents for headaches.  History supplemented by ED and referring provider notes.  Onset:  ***  They became worse in October 2019.  She was evaluated in the ED on 03/17/18 and treated with a migraine cocktail.  *** Location:  *** Quality:  *** Intensity:  ***.  *** denies new headache, thunderclap headache or severe headache that wakes *** from sleep. Aura:  *** Premonitory Phase:  *** Postdrome:  *** Associated symptoms:  Photophobia.  She denies associated nausea, vomiting, visual disturbance, dizziness or unilateral numbness or weakness. Duration:  *** Frequency:  *** Frequency of abortive medication: *** Triggers:  *** Exacerbating factors:  *** Relieving factors:  *** Activity:  ***  Current NSAIDS:  Ibuprofen, naproxen Current analgesics:  Goody powder Current triptans:  none Current ergotamine:  none Current anti-emetic:  none Current muscle relaxants:  none Current anti-anxiolytic:  none Current sleep aide:  none Current Antihypertensive medications:  none Current Antidepressant medications:  Citalopram 20mg  daily Current Anticonvulsant medications:  none Current anti-CGRP:  none Current Vitamins/Herbal/Supplements:  none Current Antihistamines/Decongestants:  none Other therapy:  none Hormone/birth control:  Depo-Provera  MRI of brain without contrast from 06/11/18 was unremarkable.    Past NSAIDS:  *** Past analgesics:  *** Past abortive triptans:  Maxalt Past abortive ergotamine:  *** Past muscle relaxants:  *** Past anti-emetic:  *** Past antihypertensive medications:  *** Past antidepressant medications:  sertraline Past anticonvulsant medications:  topiramate Past  anti-CGRP:  *** Past vitamins/Herbal/Supplements:  *** Past antihistamines/decongestants:  *** Other past therapies:  ***  Caffeine:  *** Alcohol:  *** Smoker:  *** Diet:  *** Exercise:  *** Depression:  ***; Anxiety:  *** Other pain:  *** Sleep hygiene:  *** Family history of headache:  ***  CMP from July was unremarkable.  PAST MEDICAL HISTORY: Past Medical History:  Diagnosis Date  . Anxiety   . Asthma   . Depression   . DUB (dysfunctional uterine bleeding)   . PCOS (polycystic ovarian syndrome)     PAST SURGICAL HISTORY: Past Surgical History:  Procedure Laterality Date  . CESAREAN SECTION    . TONSILLECTOMY      MEDICATIONS: Current Outpatient Medications on File Prior to Visit  Medication Sig Dispense Refill  . albuterol (PROVENTIL HFA;VENTOLIN HFA) 108 (90 Base) MCG/ACT inhaler Inhale 2 puffs into the lungs every 6 (six) hours as needed for wheezing or shortness of breath. 6.7 g 2  . albuterol (PROVENTIL) (2.5 MG/3ML) 0.083% nebulizer solution Take 3 mLs (2.5 mg total) by nebulization every 6 (six) hours as needed for wheezing or shortness of breath. 150 mL 0  . Aspirin-Acetaminophen-Caffeine (GOODY HEADACHE PO) Take 1-2 Packages by mouth daily as needed (for headache).    . budesonide-formoterol (SYMBICORT) 80-4.5 MCG/ACT inhaler Inhale 2 puffs into the lungs 2 (two) times daily. 1 Inhaler 3  . citalopram (CELEXA) 20 MG tablet Take 1 tablet (20 mg total) by mouth daily. 30 tablet 2  . ibuprofen (ADVIL,MOTRIN) 200 MG tablet Take 200 mg by mouth every 6 (six) hours as needed for mild pain or moderate pain.    . medroxyPROGESTERone (DEPO-PROVERA) 150 MG/ML injection Inject 150 mg into the muscle every 3 (three) months.    . naproxen sodium (  ALEVE) 220 MG tablet Take 220 mg by mouth 2 (two) times daily as needed (for pain).    Marland Kitchen oseltamivir (TAMIFLU) 75 MG capsule Take 1 capsule (75 mg total) by mouth 2 (two) times daily. 10 capsule 0  . predniSONE (DELTASONE) 20 MG  tablet 3qd for 3d then 2qd for 3d then 1qd for 3d 18 tablet 0   No current facility-administered medications on file prior to visit.     ALLERGIES: Allergies  Allergen Reactions  . Zoloft [Sertraline Hcl] Other (See Comments)    Suicidal thoughts or ideation    FAMILY HISTORY: Family History  Problem Relation Age of Onset  . Diabetes Mother   . Hypertension Mother    ***.  SOCIAL HISTORY: Social History   Socioeconomic History  . Marital status: Single    Spouse name: Not on file  . Number of children: Not on file  . Years of education: Not on file  . Highest education level: Not on file  Occupational History  . Not on file  Social Needs  . Financial resource strain: Not on file  . Food insecurity:    Worry: Not on file    Inability: Not on file  . Transportation needs:    Medical: Not on file    Non-medical: Not on file  Tobacco Use  . Smoking status: Never Smoker  . Smokeless tobacco: Never Used  Substance and Sexual Activity  . Alcohol use: No  . Drug use: No  . Sexual activity: Yes    Birth control/protection: None  Lifestyle  . Physical activity:    Days per week: Not on file    Minutes per session: Not on file  . Stress: Not on file  Relationships  . Social connections:    Talks on phone: Not on file    Gets together: Not on file    Attends religious service: Not on file    Active member of club or organization: Not on file    Attends meetings of clubs or organizations: Not on file    Relationship status: Not on file  . Intimate partner violence:    Fear of current or ex partner: Not on file    Emotionally abused: Not on file    Physically abused: Not on file    Forced sexual activity: Not on file  Other Topics Concern  . Not on file  Social History Narrative  . Not on file    REVIEW OF SYSTEMS: Constitutional: No fevers, chills, or sweats, no generalized fatigue, change in appetite Eyes: No visual changes, double vision, eye pain Ear,  nose and throat: No hearing loss, ear pain, nasal congestion, sore throat Cardiovascular: No chest pain, palpitations Respiratory:  No shortness of breath at rest or with exertion, wheezes GastrointestinaI: No nausea, vomiting, diarrhea, abdominal pain, fecal incontinence Genitourinary:  No dysuria, urinary retention or frequency Musculoskeletal:  No neck pain, back pain Integumentary: No rash, pruritus, skin lesions Neurological: as above Psychiatric: No depression, insomnia, anxiety Endocrine: No palpitations, fatigue, diaphoresis, mood swings, change in appetite, change in weight, increased thirst Hematologic/Lymphatic:  No purpura, petechiae. Allergic/Immunologic: no itchy/runny eyes, nasal congestion, recent allergic reactions, rashes  PHYSICAL EXAM: *** General: No acute distress.  Patient appears ***-groomed.  *** Head:  Normocephalic/atraumatic Eyes:  fundi examined but not visualized Neck: supple, no paraspinal tenderness, full range of motion Back: No paraspinal tenderness Heart: regular rate and rhythm Lungs: Clear to auscultation bilaterally. Vascular: No carotid bruits. Neurological Exam: Mental status:  alert and oriented to person, place, and time, recent and remote memory intact, fund of knowledge intact, attention and concentration intact, speech fluent and not dysarthric, language intact. Cranial nerves: CN I: not tested CN II: pupils equal, round and reactive to light, visual fields intact CN III, IV, VI:  full range of motion, no nystagmus, no ptosis CN V: facial sensation intact CN VII: upper and lower face symmetric CN VIII: hearing intact CN IX, X: gag intact, uvula midline CN XI: sternocleidomastoid and trapezius muscles intact CN XII: tongue midline Bulk & Tone: normal, no fasciculations. Motor:  5/5 throughout *** Sensation:  Pinprick *** temperature *** and vibration sensation intact.  ***. Deep Tendon Reflexes:  2+ throughout, *** toes downgoing.  ***  Finger to nose testing:  Without dysmetria.  *** Heel to shin:  Without dysmetria.  *** Gait:  Normal station and stride.  Able to turn and tandem walk. Romberg ***.  IMPRESSION: ***  PLAN: ***  Thank you for allowing me to take part in the care of this patient.  Metta Clines, DO  CC: ***

## 2018-08-22 ENCOUNTER — Ambulatory Visit: Payer: Self-pay | Admitting: Neurology

## 2018-09-11 ENCOUNTER — Ambulatory Visit (INDEPENDENT_AMBULATORY_CARE_PROVIDER_SITE_OTHER): Payer: BLUE CROSS/BLUE SHIELD | Admitting: Family Medicine

## 2018-09-11 ENCOUNTER — Encounter: Payer: Self-pay | Admitting: Family Medicine

## 2018-09-11 ENCOUNTER — Other Ambulatory Visit: Payer: Self-pay

## 2018-09-11 DIAGNOSIS — J4521 Mild intermittent asthma with (acute) exacerbation: Secondary | ICD-10-CM

## 2018-09-11 DIAGNOSIS — J329 Chronic sinusitis, unspecified: Secondary | ICD-10-CM | POA: Diagnosis not present

## 2018-09-11 MED ORDER — ALBUTEROL SULFATE HFA 108 (90 BASE) MCG/ACT IN AERS: 2.0000 | INHALATION_SPRAY | Freq: Four times a day (QID) | RESPIRATORY_TRACT | 2 refills | Status: DC | PRN

## 2018-09-11 MED ORDER — PREDNISONE 10 MG PO TABS: ORAL_TABLET | ORAL | 0 refills | Status: DC

## 2018-09-11 MED ORDER — AMOXICILLIN-POT CLAVULANATE 875-125 MG PO TABS: ORAL_TABLET | ORAL | 0 refills | Status: DC

## 2018-09-11 NOTE — Progress Notes (Signed)
   Subjective:    Patient ID: Stephanie Torres, female    DOB: 1996-04-26, 23 y.o.   MRN: [id number] Virtual visit audio plus visual Sinusitis  This is a new problem. Episode onset: monday. Associated symptoms include coughing, headaches, shortness of breath and a sore throat. (Weakness,wheezing, runny nose, throat not hurting to bad today, coughing up thick phlegm, achey) Past treatments include acetaminophen (zyrtec, sudafed, flonase nasal spray, robutussin). The treatment provided no relief.    Pt was outside a lot over the weekend.  Patient notes congestion and stuffiness.  Blowing some nasal discharge out.  Notes her wheezing has acted up some.  Albuterol rather frequently.  Headache is frontal.  Shortness of breath is a sensation due to the wheezing as per patient  No fever  Review of Systems  HENT: Positive for sore throat.   Respiratory: Positive for cough and shortness of breath.   Neurological: Positive for headaches.       Objective:   Physical Exam   Virtual visit     Assessment & Plan:  Impression exacerbation of asthma and probable sinusitis post allergy.  Allergy measures discussed.  Short course of steroids.  Frequent albuterol.  Antibiotics prescribed.  Symptom care discussed  Discussion held regarding potential for this being coronavirus.  It is unlikely but I cannot say with 314% certainty.  Warning signs discussed carefully.  Cautioned to not expose anyone else unnecessarily.  Hygiene discussed warning signs discussed

## 2018-10-02 ENCOUNTER — Encounter (HOSPITAL_COMMUNITY): Payer: Self-pay | Admitting: Emergency Medicine

## 2018-10-02 ENCOUNTER — Emergency Department (HOSPITAL_COMMUNITY)
Admission: EM | Admit: 2018-10-02 | Discharge: 2018-10-02 | Disposition: A | Discharge status: Home / Self Care | Payer: BLUE CROSS/BLUE SHIELD | Attending: Emergency Medicine | Admitting: Emergency Medicine

## 2018-10-02 ENCOUNTER — Telehealth: Payer: Self-pay | Admitting: *Deleted

## 2018-10-02 ENCOUNTER — Emergency Department (HOSPITAL_COMMUNITY): Payer: BLUE CROSS/BLUE SHIELD

## 2018-10-02 ENCOUNTER — Other Ambulatory Visit: Payer: Self-pay

## 2018-10-02 DIAGNOSIS — Z79899 Other long term (current) drug therapy: Secondary | ICD-10-CM | POA: Diagnosis not present

## 2018-10-02 DIAGNOSIS — J45909 Unspecified asthma, uncomplicated: Secondary | ICD-10-CM | POA: Diagnosis not present

## 2018-10-02 DIAGNOSIS — N23 Unspecified renal colic: Secondary | ICD-10-CM

## 2018-10-02 DIAGNOSIS — R109 Unspecified abdominal pain: Secondary | ICD-10-CM | POA: Diagnosis not present

## 2018-10-02 DIAGNOSIS — N132 Hydronephrosis with renal and ureteral calculous obstruction: Secondary | ICD-10-CM | POA: Diagnosis not present

## 2018-10-02 DIAGNOSIS — N200 Calculus of kidney: Secondary | ICD-10-CM | POA: Diagnosis not present

## 2018-10-02 LAB — CBC WITH DIFFERENTIAL/PLATELET
Abs Immature Granulocytes: 0.03 10*3/uL (ref 0.00–0.07)
Basophils Absolute: 0.1 10*3/uL (ref 0.0–0.1)
Basophils Relative: 1 %
Eosinophils Absolute: 0.3 10*3/uL (ref 0.0–0.5)
Eosinophils Relative: 3 %
HCT: 35.6 % — ABNORMAL LOW (ref 36.0–46.0)
Hemoglobin: 10.8 g/dL — ABNORMAL LOW (ref 12.0–15.0)
Immature Granulocytes: 0 %
Lymphocytes Relative: 34 %
Lymphs Abs: 3.3 10*3/uL (ref 0.7–4.0)
MCH: 20.7 pg — ABNORMAL LOW (ref 26.0–34.0)
MCHC: 30.3 g/dL (ref 30.0–36.0)
MCV: 68.2 fL — ABNORMAL LOW (ref 80.0–100.0)
Monocytes Absolute: 0.5 10*3/uL (ref 0.1–1.0)
Monocytes Relative: 5 %
Neutro Abs: 5.6 10*3/uL (ref 1.7–7.7)
Neutrophils Relative %: 57 %
Platelets: 436 10*3/uL — ABNORMAL HIGH (ref 150–400)
RBC: 5.22 MIL/uL — ABNORMAL HIGH (ref 3.87–5.11)
RDW: 18.6 % — ABNORMAL HIGH (ref 11.5–15.5)
WBC: 9.8 10*3/uL (ref 4.0–10.5)
nRBC: 0 % (ref 0.0–0.2)

## 2018-10-02 LAB — URINALYSIS, ROUTINE W REFLEX MICROSCOPIC
Bilirubin Urine: NEGATIVE
Glucose, UA: NEGATIVE mg/dL
Hgb urine dipstick: NEGATIVE
Ketones, ur: NEGATIVE mg/dL
Nitrite: NEGATIVE
Protein, ur: NEGATIVE mg/dL
Specific Gravity, Urine: 1.018 (ref 1.005–1.030)
WBC, UA: >50 WBC/hpf — ABNORMAL HIGH (ref 0–5)
pH: 5 (ref 5.0–8.0)

## 2018-10-02 LAB — COMPREHENSIVE METABOLIC PANEL
ALT: 17 U/L (ref 0–44)
AST: 16 U/L (ref 15–41)
Albumin: 3.5 g/dL (ref 3.5–5.0)
Alkaline Phosphatase: 68 U/L (ref 38–126)
Anion gap: 9 (ref 5–15)
BUN: 10 mg/dL (ref 6–20)
CO2: 26 mmol/L (ref 22–32)
Calcium: 8.9 mg/dL (ref 8.9–10.3)
Chloride: 103 mmol/L (ref 98–111)
Creatinine, Ser: 0.64 mg/dL (ref 0.44–1.00)
GFR calc Af Amer: >60 mL/min (ref >60)
GFR calc non Af Amer: >60 mL/min (ref >60)
Glucose, Bld: 136 mg/dL — ABNORMAL HIGH (ref 70–99)
Potassium: 3.8 mmol/L (ref 3.5–5.1)
Sodium: 138 mmol/L (ref 135–145)
Total Bilirubin: 0.8 mg/dL (ref 0.3–1.2)
Total Protein: 7.7 g/dL (ref 6.5–8.1)

## 2018-10-02 LAB — POC URINE PREG, ED: Preg Test, Ur: NEGATIVE

## 2018-10-02 MED ORDER — HYDROCODONE-ACETAMINOPHEN 5-325 MG PO TABS: 1.0000 | ORAL_TABLET | ORAL | 0 refills | Status: DC | PRN

## 2018-10-02 MED ORDER — TAMSULOSIN HCL 0.4 MG PO CAPS: ORAL_CAPSULE | ORAL | 0 refills | Status: DC

## 2018-10-02 NOTE — Telephone Encounter (Signed)
Pt called with severe right lower quadrant pain for the past 2 -3 days. States it is much worse today. She can hardly stand the pain and having some nausea today. Pt advised to go to ED. Pt states she will go to hospital in [place].

## 2018-10-02 NOTE — ED Provider Notes (Signed)
[facility] EMERGENCY DEPARTMENT Provider Note   CSN: [id number] Arrival date & time: 10/02/18  1319    History   Chief Complaint Chief Complaint  Patient presents with   Flank Pain    HPI Stephanie Torres is a 23 y.o. female.     HPI   She presents for evaluation of right flank pain, waxing and waning for several days.  She denies dysuria, urinary frequency, hematuria.  She has been nauseated but is able to eat and drink some.  No vomiting or diarrhea.  She is unsure when her last pregnancy was, however, her last remembered Depo-Provera shot was February 2020.  She denies fever, chills, cough, shortness of breath, dizziness or weakness.  There are no other known modifying factors.  Past Medical History:  Diagnosis Date   Anxiety    Asthma    Depression    DUB (dysfunctional uterine bleeding)    PCOS (polycystic ovarian syndrome)     Patient Active Problem List   Diagnosis Date Noted   Suicidal overdose, sequela ([place]) 04/27/2017   Anxiety and depression 04/27/2017   Depression 04/18/2016   Ingrown toenail 04/12/2013   PCOS (polycystic ovarian syndrome) 04/11/2013   Hyperinsulinemia 11/21/2012   Iron deficiency anemia 11/21/2012   Other and unspecified hyperlipidemia 11/21/2012   Gastritis 10/31/2012   Migraine headache without aura 10/31/2012   Asthma with acute exacerbation 10/31/2012   Morbid obesity ([place]) 10/31/2012    Past Surgical History:  Procedure Laterality Date   CESAREAN SECTION     TONSILLECTOMY       OB History    Gravida  1   Para  1   Term      Preterm      AB      Living        SAB      TAB      Ectopic      Multiple      Live Births               Home Medications    Prior to Admission medications   Medication Sig Start Date End Date Taking? Authorizing Provider  albuterol (PROVENTIL HFA;VENTOLIN HFA) 108 (90 Base) MCG/ACT inhaler Inhale 2 puffs into the lungs every 6 (six) hours as needed for  wheezing or shortness of breath. 09/11/18   Mikey Kirschner, MD  albuterol (PROVENTIL) (2.5 MG/3ML) 0.083% nebulizer solution Take 3 mLs (2.5 mg total) by nebulization every 6 (six) hours as needed for wheezing or shortness of breath. 07/10/18   Mikey Kirschner, MD  amoxicillin-clavulanate (AUGMENTIN) 875-125 MG tablet Take one tablet by mouth twice daily for 10 days 09/11/18   Mikey Kirschner, MD  Aspirin-Acetaminophen-Caffeine (GOODY HEADACHE PO) Take 1-2 Packages by mouth daily as needed (for headache).    [provider]  budesonide-formoterol (SYMBICORT) 80-4.5 MCG/ACT inhaler Inhale 2 puffs into the lungs 2 (two) times daily. 03/01/18   Kathyrn Drown, MD  citalopram (CELEXA) 20 MG tablet Take 1 tablet (20 mg total) by mouth daily. 04/02/18   Cheyenne Adas, NP  HYDROcodone-acetaminophen (NORCO) 5-325 MG tablet Take 1 tablet by mouth every 4 (four) hours as needed for moderate pain. 10/02/18   Daleen Bo, MD  ibuprofen (ADVIL,MOTRIN) 200 MG tablet Take 200 mg by mouth every 6 (six) hours as needed for mild pain or moderate pain.    [provider]  medroxyPROGESTERone (DEPO-PROVERA) 150 MG/ML injection Inject 150 mg into the muscle every 3 (  three) months.    [provider]  naproxen sodium (ALEVE) 220 MG tablet Take 220 mg by mouth 2 (two) times daily as needed (for pain).    [provider]  oseltamivir (TAMIFLU) 75 MG capsule Take 1 capsule (75 mg total) by mouth 2 (two) times daily. 07/10/18   Mikey Kirschner, MD  predniSONE (DELTASONE) 10 MG tablet Take 4 tablets a day by mouth for 3 days, then three tablets a day for 3 days 09/11/18   Mikey Kirschner, MD  predniSONE (DELTASONE) 20 MG tablet 3qd for 3d then 2qd for 3d then 1qd for 3d 07/10/18   Mikey Kirschner, MD  tamsulosin [facility]) 0.4 MG CAPS capsule 1 q HS to aid stone passage 10/02/18   Daleen Bo, MD    Family History Family History  Problem Relation Age of Onset   Diabetes Mother     Hypertension Mother     Social History Social History   Tobacco Use   Smoking status: Never Smoker   Smokeless tobacco: Never Used  Substance Use Topics   Alcohol use: No   Drug use: No     Allergies   Zoloft [sertraline hcl]   Review of Systems Review of Systems  All other systems reviewed and are negative.    Physical Exam Updated Vital Signs BP 117/70 (BP Location: Right Arm)    Pulse (!) 104    Temp 98.5 F (36.9 C) (Oral)    Resp 18    Ht 5\' 3"  (1.6 m)    Wt 122.5 kg    SpO2 100%    BMI 47.83 kg/m   Physical Exam Vitals signs and nursing note reviewed.  Constitutional:      General: She is not in acute distress.    Appearance: She is well-developed. She is obese. She is not ill-appearing, toxic-appearing or diaphoretic.  HENT:     Head: Normocephalic and atraumatic.     Right Ear: External ear normal.     Left Ear: External ear normal.  Eyes:     Conjunctiva/sclera: Conjunctivae normal.     Pupils: Pupils are equal, round, and reactive to light.  Neck:     Musculoskeletal: Normal range of motion and neck supple.     Trachea: Phonation normal.  Cardiovascular:     Rate and Rhythm: Normal rate and regular rhythm.     Heart sounds: Normal heart sounds.  Pulmonary:     Effort: Pulmonary effort is normal.     Breath sounds: Normal breath sounds.  Abdominal:     General: There is no distension.     Palpations: Abdomen is soft. There is no mass.     Tenderness: There is no abdominal tenderness. There is right CVA tenderness. There is no left CVA tenderness.     Hernia: No hernia is present.  Musculoskeletal: Normal range of motion.  Skin:    General: Skin is warm and dry.  Neurological:     Mental Status: She is alert and oriented to person, place, and time.     Cranial Nerves: No cranial nerve deficit.     Sensory: No sensory deficit.     Motor: No abnormal muscle tone.     Coordination: Coordination normal.  Psychiatric:        Mood and Affect:  Mood normal.        Behavior: Behavior normal.        Thought Content: Thought content normal.  Judgment: Judgment normal.      ED Treatments / Results  Labs (all labs ordered are listed, but only abnormal results are displayed) Labs Reviewed  COMPREHENSIVE METABOLIC PANEL - Abnormal; Notable for the following components:      Result Value   Glucose, Bld 136 (*)    All other components within normal limits  CBC WITH DIFFERENTIAL/PLATELET - Abnormal; Notable for the following components:   RBC 5.22 (*)    Hemoglobin 10.8 (*)    HCT 35.6 (*)    MCV 68.2 (*)    MCH 20.7 (*)    RDW 18.6 (*)    Platelets 436 (*)    All other components within normal limits  URINALYSIS, ROUTINE W REFLEX MICROSCOPIC - Abnormal; Notable for the following components:   APPearance HAZY (*)    Leukocytes,Ua MODERATE (*)    WBC, UA >50 (*)    Bacteria, UA RARE (*)    All other components within normal limits  I-STAT BETA HCG BLOOD, ED ([facility], [facility], [facility] ONLY)  POC URINE PREG, ED    EKG None  Radiology Ct Renal Stone Study  Result Date: 10/02/2018 CLINICAL DATA:  23 year old female with severe right lower quadrant pain for the past 2-3 days EXAM: CT ABDOMEN AND PELVIS WITHOUT CONTRAST TECHNIQUE: Multidetector CT imaging of the abdomen and pelvis was performed following the standard protocol without IV contrast. COMPARISON:  None. FINDINGS: Lower chest: The lung bases are clear. Visualized cardiac structures are within normal limits for size. No pericardial effusion. Unremarkable visualized distal thoracic esophagus. Hepatobiliary: No focal liver abnormality is seen. No gallstones, gallbladder wall thickening, or biliary dilatation. Pancreas: Unremarkable. No pancreatic ductal dilatation or surrounding inflammatory changes. Spleen: Normal in size without focal abnormality. Adrenals/Urinary Tract: Normal adrenal glands. The left kidney is normal in appearance. On the right, there is mild hydronephrosis. 9 x  8 mm stone is present in the renal pelvis. No evidence of ureteral dilatation. The bladder is unremarkable. Stomach/Bowel: No evidence of obstruction or focal bowel wall thickening. Normal appendix in the right lower quadrant. The terminal ileum is unremarkable. Vascular/Lymphatic: Limited evaluation in the absence of intravenous contrast. No evidence of aneurysm, significant atherosclerotic plaque or suspicious lymphadenopathy. Reproductive: Uterus and bilateral adnexa are unremarkable. Other: No abdominal wall hernia or abnormality. No abdominopelvic ascites. Musculoskeletal: No acute or significant osseous findings. IMPRESSION: 1. Mild right hydronephrosis secondary to a partially obstructing 9 x 8 mm stone in the renal pelvis. No additional nephro or ureterolithiasis is identified. 2. The left kidney is unremarkable. 3. Electronically Signed   By: Jacqulynn Cadet M.D.   On: 10/02/2018 16:00    Procedures Procedures (including critical care time)  Medications Ordered in ED Medications - No data to display   Initial Impression / Assessment and Plan / ED Course  I have reviewed the triage vital signs and the nursing notes.  Pertinent labs & imaging results that were available during my care of the patient were reviewed by me and considered in my medical decision making (see chart for details).  Clinical Course as of Oct 01 1617  Wed Oct 02, 2018  1604 Normal except hemoglobin low, MCV low  CBC with Differential(!) [EW]  1604 Normal except glucose high  Comprehensive metabolic panel(!) [EW]  4580 Normal  POC urine preg, ED (not at [facility]) [EW]  1604 Normal except increased leukocytes, increased white cells, rare bacteria  Urinalysis, Routine w reflex microscopic(!) [EW]  1605 Abnormal, right renal pelvis stone with mild  hydronephrosis.  Stone is 9 x 8 mm.  Images reviewed by me   [EW]    Clinical Course User Index [EW] Daleen Bo, MD        Patient Vitals for the past 24 hrs:   BP Temp Temp src Pulse Resp SpO2 Height Weight  10/02/18 1600 117/70 98.5 F (36.9 C) Oral (!) 104 -- 100 % -- --  10/02/18 1325 125/82 98.9 F (37.2 C) -- (!) 124 18 100 % 5\' 3"  (1.6 m) 122.5 kg    4:19 PM Reevaluation with update and discussion. After initial assessment and treatment, an updated evaluation reveals she remains comfortable has no further complaints.  Findings discussed and questions answered. Daleen Bo   Medical Decision Making: Right flank pain secondary to right kidney stone with partial obstruction.  Traction is causing mild hydronephrosis.  Doubt serious bacterial infection, UTI, metabolic instability.  CRITICAL CARE-no Performed by: Daleen Bo  Nursing Notes Reviewed/ Care Coordinated Applicable Imaging Reviewed Interpretation of Laboratory Data incorporated into ED treatment  The patient appears reasonably screened and/or stabilized for discharge and I doubt any other medical condition or other [facility] requiring further screening, evaluation, or treatment in the ED at this time prior to discharge.  Plan: Home Medications-continue routine medications; Home Treatments-rest, fluids; return here if the recommended treatment, does not improve the symptoms; Recommended follow up-PCP, PRN.  Follow-up with urology in a week or so for further care and treatment.  Patient understands that she may require lithotripsy.   Final Clinical Impressions(s) / ED Diagnoses   Final diagnoses:  Renal colic on right side  Renal stone    ED Discharge Orders         Ordered    HYDROcodone-acetaminophen (NORCO) 5-325 MG tablet  Every 4 hours PRN     10/02/18 1618    tamsulosin (FLOMAX) 0.4 MG CAPS capsule     10/02/18 1618           Daleen Bo, MD 10/02/18 1620

## 2018-10-02 NOTE — Discharge Instructions (Addendum)
You have a large stone which is partially obstructing the right kidney.  You will need to follow-up with a urologist for further care and treatment of this.  We are prescribing medications, sent to your pharmacy, to treat pain and help the stone to pass.  Call the urologist today to schedule a follow-up appointment.

## 2018-10-02 NOTE — Telephone Encounter (Signed)
Correct advice

## 2018-10-02 NOTE — ED Triage Notes (Signed)
Pt c/o of flank pain that radiating to middle of abdomen with nausea. Denies any urinary symptoms

## 2018-10-02 NOTE — ED Notes (Signed)
Refused discharge vitals.

## 2018-10-25 ENCOUNTER — Ambulatory Visit (INDEPENDENT_AMBULATORY_CARE_PROVIDER_SITE_OTHER): Payer: BLUE CROSS/BLUE SHIELD

## 2018-10-25 ENCOUNTER — Other Ambulatory Visit: Payer: Self-pay

## 2018-10-25 DIAGNOSIS — Z3042 Encounter for surveillance of injectable contraceptive: Secondary | ICD-10-CM

## 2018-10-25 MED ORDER — MEDROXYPROGESTERONE ACETATE 150 MG/ML IM SUSP
150.0000 mg | Freq: Once | INTRAMUSCULAR | Status: AC
  Administered 2018-10-25: 150 mg via INTRAMUSCULAR

## 2018-11-06 ENCOUNTER — Ambulatory Visit: Payer: Self-pay | Admitting: Neurology

## 2018-12-20 ENCOUNTER — Ambulatory Visit (INDEPENDENT_AMBULATORY_CARE_PROVIDER_SITE_OTHER): Payer: BC Managed Care – PPO | Admitting: Nurse Practitioner

## 2018-12-20 ENCOUNTER — Other Ambulatory Visit: Payer: Self-pay

## 2018-12-20 VITALS — BP 122/80 | Temp 97.7°F | Ht 63.0 in | Wt 278.0 lb

## 2018-12-20 DIAGNOSIS — F32A Depression, unspecified: Secondary | ICD-10-CM

## 2018-12-20 DIAGNOSIS — F329 Major depressive disorder, single episode, unspecified: Secondary | ICD-10-CM | POA: Diagnosis not present

## 2018-12-20 DIAGNOSIS — F419 Anxiety disorder, unspecified: Secondary | ICD-10-CM | POA: Diagnosis not present

## 2018-12-20 MED ORDER — CITALOPRAM HYDROBROMIDE 20 MG PO TABS: ORAL_TABLET | ORAL | 2 refills | Status: DC

## 2018-12-20 MED ORDER — HYDROXYZINE HCL 25 MG PO TABS: ORAL_TABLET | ORAL | 0 refills | Status: DC

## 2018-12-20 NOTE — Progress Notes (Signed)
   Subjective:    Patient ID: Stephanie Torres, female    DOB: 06-28-1995, 23 y.o.   MRN: [id number]  HPIpt states her mother set up this appointment for her due to her mood swings. Pt states she is having anxiety and depression. Gad 7 and phq9 completed.   Presents for c/o an exacerbation of her anxiety and depression over the past several months. Has been off her Celexa. Relates most of her symptoms to stress at home. Got married a month ago. Getting along with her husband but has allowed her father to come live with them. The tension between her husband and her father is causing her the most stress. Also has a young child at home that has sleep issues, sometimes staying up until midnight. Has not done well with her diet. Limited exercise. Does have someone there to help with her child if given something for sleep. Was seen at ED for intentional drug overdose in 2018. States her symptoms are not that bad at this point and denies suicidal or homicidal thoughts or ideation.  Depression screen [facility] 2/9 12/14/2017 04/25/2017 04/06/2017 04/06/2017  Decreased Interest 0 0 3 3  Down, Depressed, Hopeless 0 1 3 3   PHQ - 2 Score 0 1 6 6   Altered sleeping 3 0 2 2  Tired, decreased energy 1 0 3 3  Change in appetite 1 1 2 2   Feeling bad or failure about yourself  0 2 2 2   Trouble concentrating 1 0 0 0  Moving slowly or fidgety/restless 1 1 1 1   Suicidal thoughts 0 0 1 1  PHQ-9 Score 7 5 17 17   Difficult doing work/chores Somewhat difficult Very difficult Very difficult -     Review of Systems     GAD 7 : Generalized Anxiety Score 12/14/2017 04/06/2017  Nervous, Anxious, on Edge 3 2  Control/stop worrying 3 3  Worry too much - different things 3 3  Trouble relaxing 3 3  Restless 3 2  Easily annoyed or irritable 3 3  Afraid - awful might happen 3 3  Total GAD 7 Score 21 19  Anxiety Difficulty Somewhat difficult Very difficult     Objective:   Physical Exam NAD.  Alert, oriented.  Lungs clear.   Heart regular rate rhythm.  Crying at times during office visit.  Making good eye contact.  Thoughts logical coherent and relevant.       Assessment & Plan:   Problem List Items Addressed This Visit      Other   Anxiety and depression - Primary   Relevant Medications   citalopram (CELEXA) 20 MG tablet   hydrOXYzine (ATARAX/VISTARIL) 25 MG tablet     Discussed options.  Patient wishes to start citalopram but would like to increase dose.  Start back at 20 mg daily for 1 to 2 weeks and then if needed increase to 1-1/2 tabs p.o. daily.  DC med and call if any adverse effects.  Patient agrees to seek help immediately if any suicidal or homicidal thoughts or ideation.  Discussed importance of stress reduction.  Defers counseling at this time.  Given hydroxyzine as directed for sleep. Return in about 1 month (around 01/20/2019) for recheck. Call back sooner if needed.

## 2018-12-21 ENCOUNTER — Encounter: Payer: Self-pay | Admitting: Nurse Practitioner

## 2019-01-24 ENCOUNTER — Ambulatory Visit: Payer: BC Managed Care – PPO | Admitting: Nurse Practitioner

## 2019-01-24 ENCOUNTER — Ambulatory Visit: Payer: BLUE CROSS/BLUE SHIELD

## 2019-01-27 ENCOUNTER — Encounter (HOSPITAL_COMMUNITY): Payer: Self-pay | Admitting: Emergency Medicine

## 2019-01-27 ENCOUNTER — Other Ambulatory Visit: Payer: Self-pay

## 2019-01-27 ENCOUNTER — Emergency Department (HOSPITAL_COMMUNITY): Payer: BC Managed Care – PPO

## 2019-01-27 ENCOUNTER — Emergency Department (HOSPITAL_COMMUNITY)
Admission: EM | Admit: 2019-01-27 | Discharge: 2019-01-28 | Disposition: A | Discharge status: Home / Self Care | Payer: BC Managed Care – PPO | Attending: Emergency Medicine | Admitting: Emergency Medicine

## 2019-01-27 DIAGNOSIS — M25562 Pain in left knee: Secondary | ICD-10-CM | POA: Diagnosis not present

## 2019-01-27 DIAGNOSIS — Z79899 Other long term (current) drug therapy: Secondary | ICD-10-CM | POA: Diagnosis not present

## 2019-01-27 DIAGNOSIS — J45909 Unspecified asthma, uncomplicated: Secondary | ICD-10-CM | POA: Diagnosis not present

## 2019-01-27 DIAGNOSIS — W19XXXA Unspecified fall, initial encounter: Secondary | ICD-10-CM | POA: Insufficient documentation

## 2019-01-27 DIAGNOSIS — S8992XA Unspecified injury of left lower leg, initial encounter: Secondary | ICD-10-CM | POA: Diagnosis not present

## 2019-01-27 NOTE — ED Triage Notes (Signed)
Pt reports she fell at school today and hurt her left knee.

## 2019-01-28 DIAGNOSIS — J45909 Unspecified asthma, uncomplicated: Secondary | ICD-10-CM | POA: Diagnosis not present

## 2019-01-28 DIAGNOSIS — W19XXXA Unspecified fall, initial encounter: Secondary | ICD-10-CM | POA: Diagnosis not present

## 2019-01-28 DIAGNOSIS — M25562 Pain in left knee: Secondary | ICD-10-CM | POA: Diagnosis not present

## 2019-01-28 DIAGNOSIS — Z79899 Other long term (current) drug therapy: Secondary | ICD-10-CM | POA: Diagnosis not present

## 2019-01-28 MED ORDER — NAPROXEN 250 MG PO TABS
500.0000 mg | ORAL_TABLET | Freq: Once | ORAL | Status: AC
  Administered 2019-01-28: 500 mg via ORAL
  Filled 2019-01-28: qty 2

## 2019-01-28 MED ORDER — NAPROXEN 500 MG PO TABS: 500.0000 mg | ORAL_TABLET | Freq: Two times a day (BID) | ORAL | 0 refills | Status: DC

## 2019-01-28 NOTE — ED Provider Notes (Signed)
[facility] EMERGENCY DEPARTMENT Provider Note   CSN: [id number] Arrival date & time: 01/27/19  1729     History   Chief Complaint Chief Complaint  Patient presents with  . Fall    HPI Stephanie Torres is a 23 y.o. female.     HPI  This is a 23 year old female who presents with left knee pain.  Patient reports that she fell onto her left knee while at school today.  She did not hit her head or lose consciousness.  She reports left knee pain.  It is worse with walking.  Currently her pain is 8 out of 10.  She has not taken anything for the pain.  She denies any other injury.  She has been ambulatory.  Past Medical History:  Diagnosis Date  . Anxiety   . Asthma   . Depression   . DUB (dysfunctional uterine bleeding)   . PCOS (polycystic ovarian syndrome)     Patient Active Problem List   Diagnosis Date Noted  . Suicidal overdose, sequela ([place]) 04/27/2017  . Anxiety and depression 04/27/2017  . Depression 04/18/2016  . Ingrown toenail 04/12/2013  . PCOS (polycystic ovarian syndrome) 04/11/2013  . Hyperinsulinemia 11/21/2012  . Iron deficiency anemia 11/21/2012  . Other and unspecified hyperlipidemia 11/21/2012  . Gastritis 10/31/2012  . Migraine headache without aura 10/31/2012  . Asthma with acute exacerbation 10/31/2012  . Morbid obesity ([place]) 10/31/2012    Past Surgical History:  Procedure Laterality Date  . CESAREAN SECTION    . TONSILLECTOMY       OB History    Gravida  1   Para  1   Term      Preterm      AB      Living        SAB      TAB      Ectopic      Multiple      Live Births               Home Medications    Prior to Admission medications   Medication Sig Start Date End Date Taking? Authorizing Provider  albuterol (PROVENTIL HFA;VENTOLIN HFA) 108 (90 Base) MCG/ACT inhaler Inhale 2 puffs into the lungs every 6 (six) hours as needed for wheezing or shortness of breath. 09/11/18   Mikey Kirschner, MD  albuterol  (PROVENTIL) (2.5 MG/3ML) 0.083% nebulizer solution Take 3 mLs (2.5 mg total) by nebulization every 6 (six) hours as needed for wheezing or shortness of breath. 07/10/18   Mikey Kirschner, MD  Aspirin-Acetaminophen-Caffeine (GOODY HEADACHE PO) Take 1-2 Packages by mouth daily as needed (for headache).    [provider]  budesonide-formoterol (SYMBICORT) 80-4.5 MCG/ACT inhaler Inhale 2 puffs into the lungs 2 (two) times daily. 03/01/18   Kathyrn Drown, MD  citalopram (CELEXA) 20 MG tablet Take 1 1/2 tabs po qd 12/20/18   Nilda Simmer, NP  HYDROcodone-acetaminophen (NORCO) 5-325 MG tablet Take 1 tablet by mouth every 4 (four) hours as needed for moderate pain. Patient not taking: Reported on 12/20/2018 10/02/18   Daleen Bo, MD  hydrOXYzine (ATARAX/VISTARIL) 25 MG tablet Take one po 30-60 minutes before bedtime prn sleep 12/20/18   Nilda Simmer, NP  ibuprofen (ADVIL,MOTRIN) 200 MG tablet Take 200 mg by mouth every 6 (six) hours as needed for mild pain or moderate pain.    [provider]  medroxyPROGESTERone (DEPO-PROVERA) 150 MG/ML injection Inject 150 mg into the muscle every  3 (three) months.    [provider]  naproxen (NAPROSYN) 500 MG tablet Take 1 tablet (500 mg total) by mouth 2 (two) times daily. 01/28/19   Niquita Digioia, Barbette Hair, MD  naproxen sodium (ALEVE) 220 MG tablet Take 220 mg by mouth 2 (two) times daily as needed (for pain).    [provider]  tamsulosin (FLOMAX) 0.4 MG CAPS capsule 1 q HS to aid stone passage 10/02/18   Daleen Bo, MD    Family History Family History  Problem Relation Age of Onset  . Diabetes Mother   . Hypertension Mother     Social History Social History   Tobacco Use  . Smoking status: Never Smoker  . Smokeless tobacco: Never Used  Substance Use Topics  . Alcohol use: No  . Drug use: No     Allergies   Zoloft [sertraline hcl]   Review of Systems Review of Systems  Musculoskeletal:       Left  knee pain  Neurological: Negative for weakness and numbness.  All other systems reviewed and are negative.    Physical Exam Updated Vital Signs BP (!) 126/95 (BP Location: Right Wrist)   Pulse (!) 117   Temp 98 F (36.7 C) (Oral)   Resp 18   Ht 1.626 m (5\' 4" )   Wt 122 kg   SpO2 99%   BMI 46.17 kg/m   Physical Exam Vitals signs and nursing note reviewed.  Constitutional:      Appearance: She is well-developed.     Comments: Morbidly obese  HENT:     Head: Normocephalic and atraumatic.  Cardiovascular:     Rate and Rhythm: Normal rate and regular rhythm.  Pulmonary:     Effort: Pulmonary effort is normal. No respiratory distress.  Musculoskeletal:     Comments: Focused examination of the left knee with no obvious deformities, there is tenderness to palpation over the lateral joint line, no joint laxity noted, normal range of motion, pain noted with range of motion, no obvious contusion, patient able to flex and extend the knee without difficulty, 2+ DP pulse  Skin:    General: Skin is warm and dry.  Neurological:     Mental Status: She is alert and oriented to person, place, and time.  Psychiatric:        Mood and Affect: Mood normal.      ED Treatments / Results  Labs (all labs ordered are listed, but only abnormal results are displayed) Labs Reviewed - No data to display  EKG None  Radiology Dg Knee Complete 4 Views Left  Result Date: 01/27/2019 CLINICAL DATA:  Anterior and medial knee pain after fall today. Felt pop. EXAM: LEFT KNEE - COMPLETE 4+ VIEW COMPARISON:  None. FINDINGS: No evidence of fracture, dislocation, or joint effusion. No evidence of arthropathy or other focal bone abnormality. Soft tissues are unremarkable. IMPRESSION: Negative. Electronically Signed   By: Lovena Le M.D.   On: 01/27/2019 18:34    Procedures Procedures (including critical care time)  Medications Ordered in ED Medications  naproxen (NAPROSYN) tablet 500 mg (has no  administration in time range)     Initial Impression / Assessment and Plan / ED Course  I have reviewed the triage vital signs and the nursing notes.  Pertinent labs & imaging results that were available during my care of the patient were reviewed by me and considered in my medical decision making (see chart for details).        Patient  presents with left knee pain after fall.  She is overall nontoxic-appearing and vital signs are reassuring.  Exam is largely reassuring with the exception of joint line pain.  X-rays are negative for acute fracture.  Suspect ligamentous or meniscal injury versus contusion.  Recommend rice therapy.  After history, exam, and medical workup I feel the patient has been appropriately medically screened and is safe for discharge home. Pertinent diagnoses were discussed with the patient. Patient was given return precautions.   Final Clinical Impressions(s) / ED Diagnoses   Final diagnoses:  Acute pain of left knee    ED Discharge Orders         Ordered    naproxen (NAPROSYN) 500 MG tablet  2 times daily     01/28/19 0016           Prisila Dlouhy, Barbette Hair, MD 01/28/19 [phone]

## 2019-01-28 NOTE — Discharge Instructions (Addendum)
You were seen today for knee pain.  Your x-rays are negative for fracture.  You may have contused your knees.  You also may have a meniscal or ligamentous injury.  Rest, ice, compression, and elevation are recommended.  Take naproxen as needed for pain.

## 2019-01-31 ENCOUNTER — Ambulatory Visit (INDEPENDENT_AMBULATORY_CARE_PROVIDER_SITE_OTHER): Payer: BC Managed Care – PPO | Admitting: Nurse Practitioner

## 2019-01-31 ENCOUNTER — Encounter: Payer: Self-pay | Admitting: Nurse Practitioner

## 2019-01-31 ENCOUNTER — Other Ambulatory Visit: Payer: Self-pay

## 2019-01-31 VITALS — Temp 97.8°F | Wt 268.8 lb

## 2019-01-31 DIAGNOSIS — F419 Anxiety disorder, unspecified: Secondary | ICD-10-CM | POA: Diagnosis not present

## 2019-01-31 DIAGNOSIS — Z3042 Encounter for surveillance of injectable contraceptive: Secondary | ICD-10-CM | POA: Diagnosis not present

## 2019-01-31 DIAGNOSIS — F329 Major depressive disorder, single episode, unspecified: Secondary | ICD-10-CM | POA: Diagnosis not present

## 2019-01-31 MED ORDER — MEDROXYPROGESTERONE ACETATE 150 MG/ML IM SUSP
150.0000 mg | Freq: Once | INTRAMUSCULAR | Status: AC
  Administered 2019-01-31: 150 mg via INTRAMUSCULAR

## 2019-01-31 MED ORDER — FLUOXETINE HCL 20 MG PO TABS: 20.0000 mg | ORAL_TABLET | Freq: Every day | ORAL | 0 refills | Status: DC

## 2019-01-31 NOTE — Progress Notes (Signed)
Subjective:    Patient ID: Stephanie Torres, female    DOB: July 24, 1995, 23 y.o.   MRN: [id number]  Pt presents for a follow-up for anxiety/depression as well as contraceptive. Pt reports Celexa medication has helped with both anxiety and depression. However, she has faced increased stressors. She is currently responsible of taking care of her father as well as 62 year old daughter. Reports getting little help taking care of her daughter or household chores. She is also a Char[employer] fundraiser. Reports sleeping only 3-4 hours per day. Has a loss of appetite. Has lost 11lbs unintentionally. Has neglected her self-care. Cannot remember the last time she showered. She is currently married. Her husband works full-time and she reports taking care of him too. Reports decreased libido, which is causing martial issues, increasing stress.  Pt states she did start back to school.   Pt also needing Depo today. Last depo was 10/25/2018; was to return on 8/8-8/22/202. Has not had intercourse since her Depo ran out.   GAD 7 : Generalized Anxiety Score 01/31/2019 12/14/2017 04/06/2017  Nervous, Anxious, on Edge 1 3 2   Control/stop worrying 1 3 3   Worry too much - different things 1 3 3   Trouble relaxing 2 3 3   Restless 3 3 2   Easily annoyed or irritable 3 3 3   Afraid - awful might happen 1 3 3   Total GAD 7 Score 12 21 19   Anxiety Difficulty Extremely difficult Somewhat difficult Very difficult    Depression screen [facility] 2/9 01/31/2019 12/14/2017 04/25/2017 04/06/2017 04/06/2017  Decreased Interest 1 0 0 3 3  Down, Depressed, Hopeless 1 0 1 3 3   PHQ - 2 Score 2 0 1 6 6   Altered sleeping 3 3 0 2 2  Tired, decreased energy 3 1 0 3 3  Change in appetite 3 1 1 2 2   Feeling bad or failure about yourself  1 0 2 2 2   Trouble concentrating 2 1 0 0 0  Moving slowly or fidgety/restless 3 1 1 1 1   Suicidal thoughts 0 0 0 1 1  PHQ-9 Score 17 7 5 17 17   Difficult doing work/chores - Somewhat difficult Very difficult Very  difficult -   Denies SI/HI.  Review of Systems  Constitutional: Positive for appetite change, fatigue and unexpected weight change.  Respiratory: Negative for cough, chest tightness and shortness of breath.   Cardiovascular: Negative for chest pain.  Genitourinary: Negative for dysuria, genital sores, menstrual problem, pelvic pain and vaginal pain.  Psychiatric/Behavioral: Positive for depression. Negative for confusion, hallucinations, self-injury and suicidal ideas. The patient is not nervous/anxious.        Objective:   Physical Exam Constitutional:      Appearance: Normal appearance.  Cardiovascular:     Rate and Rhythm: Regular rhythm. Tachycardia present.     Comments: Slight tachycardia Pulmonary:     Effort: Pulmonary effort is normal.     Breath sounds: Normal breath sounds.  Neurological:     General: No focal deficit present.     Mental Status: She is alert and oriented to person, place, and time.  Psychiatric:        Attention and Perception: Attention and perception normal. She does not perceive auditory hallucinations.        Mood and Affect: Mood normal. Affect is tearful.        Speech: Speech normal.        Behavior: Behavior normal. Behavior is cooperative.  Thought Content: Thought content normal. Thought content is not paranoid or delusional. Thought content does not include homicidal or suicidal ideation. Thought content does not include homicidal or suicidal plan.        Cognition and Memory: Cognition normal.        Judgment: Judgment normal.    Results for orders placed or performed in visit on 01/31/19  POCT urine pregnancy  Result Value Ref Range   Preg Test, Ur Negative Negative          Assessment & Plan:  2. Encounter for surveillance of injectable contraceptive - POCT urine pregnancy - medroxyPROGESTERone (DEPO-PROVERA) injection 150 mg  1. Anxiety and depression  -Lengthy discussion with patients regarding symptoms. Patient  dealing with multiple stressors with limited resources and help. Advised patient to reduce stressors if possible. Recommended counseling or mental health consult. However, pt states she does not have time for counseling. Discussed with patient that medication can only help  so much without modification of lifestyle. Recommended talking with husband and family members to see if they can provide support and alleviate some household responsibilities. Provided support and let patient know if she wants counseling or need any additional support, please contact provider. Patient agrees to seek help immediately if she has any suicidal or homicidal thoughts or ideation.  Reviewed previous medications after her visit. Stop Celexa and start new medicine on Monday so our office will be available if any problems. Patient understands there will be an adjustment period with medications. Call if any problems.  Meds ordered this encounter  Medications  . medroxyPROGESTERone (DEPO-PROVERA) injection 150 mg  . FLUoxetine (PROZAC) 20 MG tablet    Sig: Take 1 tablet (20 mg total) by mouth daily.    Dispense:  30 tablet    Refill:  0    Order Specific Question:   Supervising Provider    Answer:   Kathyrn Drown [[id number]]  Return in about 1 month (around 03/03/2019) for recheck.  25 minutes was spent with the patient.  This statement verifies that 25 minutes was indeed spent with the patient.  More than 50% of this visit-total duration of the visit-was spent in counseling and coordination of care. The issues that the patient came in for today as reflected in the diagnosis (s) please refer to documentation for further details.

## 2019-02-01 ENCOUNTER — Encounter: Payer: Self-pay | Admitting: Nurse Practitioner

## 2019-02-01 LAB — POCT URINE PREGNANCY: Preg Test, Ur: NEGATIVE

## 2019-02-12 ENCOUNTER — Other Ambulatory Visit: Payer: Self-pay

## 2019-02-12 ENCOUNTER — Ambulatory Visit (INDEPENDENT_AMBULATORY_CARE_PROVIDER_SITE_OTHER): Payer: BC Managed Care – PPO | Admitting: Family Medicine

## 2019-02-12 DIAGNOSIS — R6889 Other general symptoms and signs: Secondary | ICD-10-CM | POA: Diagnosis not present

## 2019-02-12 DIAGNOSIS — Z20822 Contact with and (suspected) exposure to covid-19: Secondary | ICD-10-CM

## 2019-02-12 DIAGNOSIS — J329 Chronic sinusitis, unspecified: Secondary | ICD-10-CM

## 2019-02-12 DIAGNOSIS — J4521 Mild intermittent asthma with (acute) exacerbation: Secondary | ICD-10-CM

## 2019-02-12 MED ORDER — PREDNISONE 20 MG PO TABS: ORAL_TABLET | ORAL | 0 refills | Status: DC

## 2019-02-12 MED ORDER — GENTAMICIN SULFATE 0.3 % OP SOLN: 2.0000 [drp] | Freq: Four times a day (QID) | OPHTHALMIC | 0 refills | Status: DC

## 2019-02-12 MED ORDER — AMOXICILLIN 500 MG PO CAPS: 500.0000 mg | ORAL_CAPSULE | Freq: Three times a day (TID) | ORAL | 0 refills | Status: DC

## 2019-02-12 NOTE — Progress Notes (Signed)
   Subjective:  Audio  Patient ID: Stephanie Torres, female    DOB: 1995-10-02, 23 y.o.   MRN: [id number]  HPI  Patient calls with bilateral eye drainage that started yesterday. Patient states she is also having sinus drainage that feel like it is going into her chest.  Virtual Visit via Video Note  I connected with Stephanie Torres on 02/12/19 at 11:00 AM EDT by a video enabled telemedicine application and verified that I am speaking with the correct person using two identifiers.  Location: Patient: home Provider: office   I discussed the limitations of evaluation and management by telemedicine and the availability of in person appointments. The patient expressed understanding and agreed to proceed.  History of Present Illness:    Observations/Objective:   Assessment and Plan:   Follow Up Instructions:    I discussed the assessment and treatment plan with the patient. The patient was provided an opportunity to ask questions and all were answered. The patient agreed with the plan and demonstrated an understanding of the instructions.   The patient was advised to call back or seek an in-person evaluation if the symptoms worsen or if the condition fails to improve as anticipated.  I provided 18 minutes of non-face-to-face time during this encounter.    yest woke up with eyes itching and irritated   Eye was crusty and gunky  All day oozed and watered  Throat pain this morn  Wheezing more today  Goes one day per week  To school   Tod feels achey  Using zyrtec     Review of Systems No rash diarrhea    Objective:   Physical Exam  Virtual       Assessment & Plan:  Impression probable rhinosinusitis with secondary conjunctivitis however with other features such as sore throat and cough.  Along with prevalence of COVID-19 in the community.  Definitely recommend COVID-19 testing.  Asthma has also flared up.  Will add prednisone taper to antibiotics plus

## 2019-02-14 ENCOUNTER — Telehealth: Payer: Self-pay | Admitting: General Practice

## 2019-02-14 LAB — NOVEL CORONAVIRUS, NAA: SARS-CoV-2, NAA: NOT DETECTED

## 2019-02-14 NOTE — Telephone Encounter (Signed)
Pt returned call for covid result Advised of Not Detected result.

## 2019-02-26 ENCOUNTER — Other Ambulatory Visit: Payer: Self-pay

## 2019-02-26 ENCOUNTER — Ambulatory Visit (INDEPENDENT_AMBULATORY_CARE_PROVIDER_SITE_OTHER): Payer: BC Managed Care – PPO | Admitting: Family Medicine

## 2019-02-26 DIAGNOSIS — J4521 Mild intermittent asthma with (acute) exacerbation: Secondary | ICD-10-CM | POA: Diagnosis not present

## 2019-02-26 DIAGNOSIS — J329 Chronic sinusitis, unspecified: Secondary | ICD-10-CM | POA: Diagnosis not present

## 2019-02-26 MED ORDER — PREDNISONE 20 MG PO TABS: ORAL_TABLET | ORAL | 0 refills | Status: DC

## 2019-02-26 MED ORDER — CEFDINIR 300 MG PO CAPS: 300.0000 mg | ORAL_CAPSULE | Freq: Two times a day (BID) | ORAL | 0 refills | Status: DC

## 2019-02-26 NOTE — Progress Notes (Signed)
   Subjective:    Patient ID: Stephanie Torres, female    DOB: 08/24/1995, 23 y.o.   MRN: [id number]  Cough This is a new problem. The current episode started in the past 7 days. Associated symptoms include nasal congestion and wheezing. She has tried OTC cough suppressant (inhaler, antibiotics) for the symptoms.      Review of Systems  Respiratory: Positive for cough and wheezing.    Virtual Visit via Video Note  I connected with Stephanie Torres on 02/26/19 at  4:10 PM EDT by a video enabled telemedicine application and verified that I am speaking with the correct person using two identifiers.  Location: Patient: home Provider: office   I discussed the limitations of evaluation and management by telemedicine and the availability of in person appointments. The patient expressed understanding and agreed to proceed.  History of Present Illness:    Observations/Objective:   Assessment and Plan:   Follow Up Instructions:    I discussed the assessment and treatment plan with the patient. The patient was provided an opportunity to ask questions and all were answered. The patient agreed with the plan and demonstrated an understanding of the instructions.   The patient was advised to call back or seek an in-person evaluation if the symptoms worsen or if the condition fails to improve as anticipated.  I provided 20 minutes of non-face-to-face time during this encounter.  See prior notes.  Patient feels it is a contact continuation of prior illness.  Wheezing substantially.  Frequent use of inhaler.  No tachypnea no fever no chills cough productive.     Objective:   Physical Exam    Virtual    Assessment & Plan:  Impression sinusitis bronchitis with exacerbation reactive airways plan prednisone taper.  Antibiotics prescribed symptom care discussed warning signs discussed

## 2019-02-28 ENCOUNTER — Other Ambulatory Visit: Payer: Self-pay

## 2019-02-28 ENCOUNTER — Ambulatory Visit (INDEPENDENT_AMBULATORY_CARE_PROVIDER_SITE_OTHER): Payer: BC Managed Care – PPO | Admitting: Nurse Practitioner

## 2019-02-28 DIAGNOSIS — F329 Major depressive disorder, single episode, unspecified: Secondary | ICD-10-CM

## 2019-02-28 DIAGNOSIS — F419 Anxiety disorder, unspecified: Secondary | ICD-10-CM

## 2019-02-28 DIAGNOSIS — F32A Depression, unspecified: Secondary | ICD-10-CM

## 2019-02-28 NOTE — Progress Notes (Signed)
  PHONE VISIT Subjective:    Patient ID: Stephanie Torres, female    DOB: 03/07/1996, 23 y.o.   MRN: [id number]  Depression      The patient presents with depression.  This is a chronic problem.  Compliance with treatment is good.  Past medical history includes depression.    Pt is on Prozac 20 mg daily. Pt states she has been having nightmares/weird dreams since starting the Prozac. Pt was on Celexa but had to change.    Virtual Visit via Video Note  I connected with Stephanie Torres on 02/28/19 at  2:00 PM EDT by a video enabled telemedicine application and verified that I am speaking with the correct person using two identifiers.  Location: Patient: home Provider: office    I discussed the limitations of evaluation and management by telemedicine and the availability of in person appointments. The patient expressed understanding and agreed to proceed.  History of Present Illness: Presents for recheck on anxiety and depression. Doing much better on Prozac. Has experienced dreams and sometimes nightmares. Not sure if it is related to the Prozac. Still remains under significant stress. Denies suicidal or homicidal thoughts or ideation.    Observations/Objective: Today's visit was via telephone Physical exam was not possible for this visit Alert, oriented. Cheerful affect.   Assessment and Plan: Problem List Items Addressed This Visit      Other   Anxiety and depression - Primary   Relevant Orders   Ambulatory referral to Psychology        Follow Up Instructions: Discussed options.  Continue current dose of Prozac for now.  Call back if nighttime issues persist.  Strongly recommend mental health counseling, patient agrees.  Referral made. Return in about 3 months (around 05/30/2019). Call back sooner if any problems.   I discussed the assessment and treatment plan with the patient. The patient was provided an opportunity to ask questions and all were answered. The patient  agreed with the plan and demonstrated an understanding of the instructions.   The patient was advised to call back or seek an in-person evaluation if the symptoms worsen or if the condition fails to improve as anticipated.  I provided 15 minutes of non-face-to-face time during this encounter.   Vicente Males, LPN    Review of Systems  Psychiatric/Behavioral: Positive for depression.       Objective:   Physical Exam        Assessment & Plan:

## 2019-03-01 ENCOUNTER — Encounter: Payer: Self-pay | Admitting: Nurse Practitioner

## 2019-03-02 ENCOUNTER — Encounter: Payer: Self-pay | Admitting: Family Medicine

## 2019-03-18 ENCOUNTER — Other Ambulatory Visit: Payer: Self-pay | Admitting: Nurse Practitioner

## 2019-03-19 NOTE — Telephone Encounter (Signed)
Six mo 

## 2019-03-25 ENCOUNTER — Telehealth: Payer: Self-pay | Admitting: Family Medicine

## 2019-03-25 NOTE — Telephone Encounter (Signed)
Nurse-patient prescription from 10/15 for prozac 20 mg was not upfront to refax to [facility] . Please advise

## 2019-03-25 NOTE — Telephone Encounter (Signed)
Called [facility] because it was sent on the 15th and we got the confirmation it went through on the 25th and pharm told me that is ready to be picked up. Left message for pt to call back to notify her.

## 2019-03-26 NOTE — Telephone Encounter (Signed)
Left message to return call 

## 2019-03-27 NOTE — Telephone Encounter (Signed)
Left message to return call 

## 2019-03-28 NOTE — Telephone Encounter (Signed)
Pt notified on voicemail that med requested was ready for  Pick up at [place]

## 2019-04-18 ENCOUNTER — Ambulatory Visit (INDEPENDENT_AMBULATORY_CARE_PROVIDER_SITE_OTHER): Payer: BC Managed Care – PPO | Admitting: Family Medicine

## 2019-04-18 ENCOUNTER — Other Ambulatory Visit: Payer: BC Managed Care – PPO

## 2019-04-18 ENCOUNTER — Other Ambulatory Visit: Payer: Self-pay

## 2019-04-18 DIAGNOSIS — J45909 Unspecified asthma, uncomplicated: Secondary | ICD-10-CM | POA: Diagnosis not present

## 2019-04-18 MED ORDER — ALBUTEROL SULFATE HFA 108 (90 BASE) MCG/ACT IN AERS: 2.0000 | INHALATION_SPRAY | Freq: Four times a day (QID) | RESPIRATORY_TRACT | 5 refills | Status: DC | PRN

## 2019-04-18 MED ORDER — ALBUTEROL SULFATE (2.5 MG/3ML) 0.083% IN NEBU: 2.5000 mg | INHALATION_SOLUTION | Freq: Four times a day (QID) | RESPIRATORY_TRACT | 5 refills | Status: DC | PRN

## 2019-04-18 MED ORDER — DOXYCYCLINE HYCLATE 100 MG PO TABS: 100.0000 mg | ORAL_TABLET | Freq: Two times a day (BID) | ORAL | 0 refills | Status: DC

## 2019-04-18 MED ORDER — PREDNISONE 20 MG PO TABS: ORAL_TABLET | ORAL | 0 refills | Status: DC

## 2019-04-18 MED ORDER — BUDESONIDE-FORMOTEROL FUMARATE 80-4.5 MCG/ACT IN AERO: 2.0000 | INHALATION_SPRAY | Freq: Two times a day (BID) | RESPIRATORY_TRACT | 5 refills | Status: DC

## 2019-04-18 NOTE — Progress Notes (Signed)
   Subjective:  Audio only  Patient ID: Stephanie Torres, female    DOB: April 14, 1996, 23 y.o.   MRN: [id number]  Asthma She complains of cough and wheezing. This is a new problem. Episode onset: one week. Her past medical history is significant for asthma.  pt states inhaler does not help at all and she is taking breathing treatments every two hours.   Virtual Visit via Telephone Note  I connected with Stephanie Torres on 04/18/19 at 10:00 AM EST by telephone and verified that I am speaking with the correct person using two identifiers.  Location: Patient: home Provider: office   I discussed the limitations, risks, security and privacy concerns of performing an evaluation and management service by telephone and the availability of in person appointments. I also discussed with the patient that there may be a patient responsible charge related to this service. The patient expressed understanding and agreed to proceed.   History of Present Illness:    Observations/Objective:   Assessment and Plan:   Follow Up Instructions:    I discussed the assessment and treatment plan with the patient. The patient was provided an opportunity to ask questions and all were answered. The patient agreed with the plan and demonstrated an understanding of the instructions.   The patient was advised to call back or seek an in-person evaluation if the symptoms worsen or if the condition fails to improve as anticipated.  I provided 18 minutes of non-face-to-face time during this encounter.  Cough is productive at times.  Also notes frontal congestion headache.  Wheezing fairly significant.  Requiring inhaler every several hours   Review of Systems  Respiratory: Positive for cough and wheezing.        Objective:   Physical Exam   Virtual     Assessment & Plan:  Impression exacerbation of asthma.  This is a 29 requiring steroids and just 3 months.  Patient claims compliance with her Symbicort.   Prednisone prescribed antibiotics prescribed.  Time for asthma referral rationale discussed with patient.

## 2019-04-20 ENCOUNTER — Encounter: Payer: Self-pay | Admitting: Family Medicine

## 2019-04-22 ENCOUNTER — Encounter: Payer: Self-pay | Admitting: Family Medicine

## 2019-04-22 ENCOUNTER — Other Ambulatory Visit: Payer: BC Managed Care – PPO

## 2019-04-29 ENCOUNTER — Telehealth: Payer: Self-pay | Admitting: *Deleted

## 2019-04-29 NOTE — Telephone Encounter (Signed)
Budesonide-formoterol fumarate 80-4.5 mcg hfa aer was denied by insurance. Preferred alternative is symbicort.

## 2019-05-02 ENCOUNTER — Other Ambulatory Visit (INDEPENDENT_AMBULATORY_CARE_PROVIDER_SITE_OTHER): Payer: BC Managed Care – PPO

## 2019-05-02 ENCOUNTER — Other Ambulatory Visit: Payer: Self-pay

## 2019-05-02 DIAGNOSIS — Z3042 Encounter for surveillance of injectable contraceptive: Secondary | ICD-10-CM

## 2019-05-02 MED ORDER — MEDROXYPROGESTERONE ACETATE 150 MG/ML IM SUSP
150.0000 mg | Freq: Once | INTRAMUSCULAR | Status: AC
  Administered 2019-05-02: 150 mg via INTRAMUSCULAR

## 2019-05-06 NOTE — Telephone Encounter (Signed)
error 

## 2019-05-07 ENCOUNTER — Other Ambulatory Visit: Payer: Self-pay | Admitting: *Deleted

## 2019-05-07 MED ORDER — BUDESONIDE-FORMOTEROL FUMARATE 80-4.5 MCG/ACT IN AERO: 2.0000 | INHALATION_SPRAY | Freq: Two times a day (BID) | RESPIRATORY_TRACT | 5 refills | Status: DC

## 2019-05-07 NOTE — Telephone Encounter (Signed)
Changed to name brand symbicort

## 2019-07-02 ENCOUNTER — Encounter: Payer: Self-pay | Admitting: Family Medicine

## 2019-07-03 ENCOUNTER — Encounter: Payer: Self-pay | Admitting: Family Medicine

## 2019-07-18 ENCOUNTER — Other Ambulatory Visit: Payer: BC Managed Care – PPO

## 2019-07-22 ENCOUNTER — Ambulatory Visit (INDEPENDENT_AMBULATORY_CARE_PROVIDER_SITE_OTHER): Payer: Medicaid Other | Admitting: Family Medicine

## 2019-07-22 ENCOUNTER — Other Ambulatory Visit: Payer: Self-pay

## 2019-07-22 VITALS — Temp 97.8°F | Ht 64.0 in | Wt 267.0 lb

## 2019-07-22 DIAGNOSIS — J310 Chronic rhinitis: Secondary | ICD-10-CM

## 2019-07-22 DIAGNOSIS — J329 Chronic sinusitis, unspecified: Secondary | ICD-10-CM

## 2019-07-22 DIAGNOSIS — J45909 Unspecified asthma, uncomplicated: Secondary | ICD-10-CM | POA: Diagnosis not present

## 2019-07-22 MED ORDER — ALBUTEROL SULFATE HFA 108 (90 BASE) MCG/ACT IN AERS: 2.0000 | INHALATION_SPRAY | Freq: Four times a day (QID) | RESPIRATORY_TRACT | 2 refills | Status: DC | PRN

## 2019-07-22 MED ORDER — CEFDINIR 300 MG PO CAPS: 300.0000 mg | ORAL_CAPSULE | Freq: Two times a day (BID) | ORAL | 0 refills | Status: DC

## 2019-07-22 MED ORDER — PREDNISONE 20 MG PO TABS: ORAL_TABLET | ORAL | 0 refills | Status: DC

## 2019-07-22 NOTE — Progress Notes (Signed)
   Subjective:  Audiovideo  Patient ID: Stephanie Torres, female    DOB: Nov 01, 1995, 24 y.o.   MRN: [id number]  Sinusitis This is a new problem. Episode onset: 2 days. Associated symptoms include chills, congestion, coughing, headaches, sneezing and a sore throat. (Has felt confused at times, wheezing) Treatments tried: mucinex, nyquil, robutissin with honey.   Needs refill on albuterol inhaler.   Virtual Visit via Telephone Note  I connected with Stephanie Torres on 07/22/19 at 10:30 AM EST by telephone and verified that I am speaking with the correct person using two identifiers.  Location: Patient: home Provider: office   I discussed the limitations, risks, security and privacy concerns of performing an evaluation and management service by telephone and the availability of in person appointments. I also discussed with the patient that there may be a patient responsible charge related to this service. The patient expressed understanding and agreed to proceed.   History of Present Illness:    Observations/Objective:   Assessment and Plan:   Follow Up Instructions:    I discussed the assessment and treatment plan with the patient. The patient was provided an opportunity to ask questions and all were answered. The patient agreed with the plan and demonstrated an understanding of the instructions.   The patient was advised to call back or seek an in-person evaluation if the symptoms worsen or if the condition fails to improve as anticipated.  I provided 22 minutes of non-face-to-face time during this encounter.  Sat felt good  Works at [employer] with sneezing and coughing  Now wheezy  Feels in the chest  No fever   Feels sob wiyh tightness  More wheezy than usual   migr like h a and feels weak  Feels outr of it      Review of Systems  Constitutional: Positive for chills.  HENT: Positive for congestion, sneezing and sore throat.   Respiratory:  Positive for cough.   Neurological: Positive for headaches.       Objective:   Physical Exam   Virtual     Assessment & Plan:  Impression rhinosinusitis/bronchitis with exacerbation of asthma.  Patient's child also sick with similar symptoms patient does work in a Production designer, theatre/television/film.  Antibiotics prescribed.  Prednisone taper.  Albuterol..  Warning signs discussed carefully.  Covid testing recommended

## 2019-07-23 ENCOUNTER — Ambulatory Visit: Payer: Medicaid Other | Attending: Internal Medicine

## 2019-07-23 DIAGNOSIS — Z20822 Contact with and (suspected) exposure to covid-19: Secondary | ICD-10-CM

## 2019-07-25 LAB — NOVEL CORONAVIRUS, NAA: SARS-CoV-2, NAA: NOT DETECTED

## 2019-09-16 ENCOUNTER — Ambulatory Visit: Payer: Medicaid Other | Admitting: Family Medicine

## 2019-09-16 ENCOUNTER — Other Ambulatory Visit: Payer: Self-pay

## 2019-09-16 ENCOUNTER — Encounter: Payer: Self-pay | Admitting: Family Medicine

## 2019-09-16 ENCOUNTER — Other Ambulatory Visit: Payer: Self-pay | Admitting: Family Medicine

## 2019-09-16 VITALS — BP 136/90 | HR 100 | Temp 98.0°F | Ht 63.0 in | Wt 278.6 lb

## 2019-09-16 DIAGNOSIS — Z308 Encounter for other contraceptive management: Secondary | ICD-10-CM | POA: Diagnosis not present

## 2019-09-16 DIAGNOSIS — Z789 Other specified health status: Secondary | ICD-10-CM | POA: Diagnosis not present

## 2019-09-16 NOTE — Progress Notes (Addendum)
   Subjective:    Patient ID: Stephanie Torres, female    DOB: 11/26/1995, 24 y.o.   MRN: [id number]  HPI  Patient arrives to discuss restarting depo provera. Patient has been on Depo provera in the past and her last shot was 05/02/2019 and was due her next shot 07/2019 but did not get it as scheduled due to illness.    Patient has not had a cycle in years on Depo provera and has not had one since stopping med.   Has been on depo provera for over 4 yrs.    Patient reports unprotected sex since stopping depo provera and the last time was about a week ago.  Has been doing well on this medication.  No new side effects.  Last pap smear was negative in 11/18- due in 11/21.   Review of Systems  Constitutional: Negative for chills and fever.  HENT: Negative for congestion, rhinorrhea and sore throat.   Respiratory: Negative for cough, shortness of breath and wheezing.   Cardiovascular: Negative for chest pain and leg swelling.  Gastrointestinal: Negative for abdominal pain, diarrhea, nausea and vomiting.  Genitourinary: Negative for dysuria, frequency, menstrual problem, vaginal bleeding, vaginal discharge and vaginal pain.  Musculoskeletal: Negative for arthralgias and back pain.  Skin: Negative for rash.  Neurological: Negative for dizziness, weakness and headaches.       Objective:   Physical Exam Vitals and nursing note reviewed.  Constitutional:      General: She is not in acute distress.    Appearance: Normal appearance. She is not ill-appearing.  HENT:     Head: Normocephalic and atraumatic.     Nose: Nose normal.     Mouth/Throat:     Mouth: Mucous membranes are moist.     Pharynx: Oropharynx is clear.  Eyes:     Extraocular Movements: Extraocular movements intact.     Conjunctiva/sclera: Conjunctivae normal.     Pupils: Pupils are equal, round, and reactive to light.  Cardiovascular:     Rate and Rhythm: Normal rate and regular rhythm.     Pulses: Normal pulses.   Heart sounds: Normal heart sounds.  Pulmonary:     Effort: Pulmonary effort is normal.     Breath sounds: Normal breath sounds. No wheezing, rhonchi or rales.  Musculoskeletal:        General: Normal range of motion.     Right lower leg: No edema.     Left lower leg: No edema.  Skin:    General: Skin is warm and dry.     Findings: No lesion or rash.  Neurological:     General: No focal deficit present.     Mental Status: She is alert and oriented to person, place, and time.  Psychiatric:        Mood and Affect: Mood normal.        Behavior: Behavior normal.    Vitals:   09/16/19 1346  BP: 136/90  Pulse: 100  Temp: 98 F (36.7 C)  SpO2: 96%       Assessment & Plan:   1. Encounter for other contraceptive management - B-HCG Quant  2. Date of last menstrual period (LMP) unknown - B-HCG Quant  Call pt with results of b-hcg and if negative will order depo-provera to fill at pharmacy and bring vial to be administered in office.  Pt in agreement.  F/u prn.

## 2019-09-17 ENCOUNTER — Other Ambulatory Visit: Payer: Self-pay | Admitting: *Deleted

## 2019-09-17 ENCOUNTER — Other Ambulatory Visit: Payer: Medicaid Other

## 2019-09-17 LAB — BETA HCG QUANT (REF LAB): hCG Quant: <1 m[IU]/mL

## 2019-09-17 MED ORDER — MEDROXYPROGESTERONE ACETATE 150 MG/ML IM SUSP: 150.0000 mg | INTRAMUSCULAR | 0 refills | Status: DC

## 2019-09-18 ENCOUNTER — Other Ambulatory Visit (INDEPENDENT_AMBULATORY_CARE_PROVIDER_SITE_OTHER): Payer: Medicaid Other

## 2019-09-18 ENCOUNTER — Other Ambulatory Visit: Payer: Self-pay

## 2019-09-18 DIAGNOSIS — Z3042 Encounter for surveillance of injectable contraceptive: Secondary | ICD-10-CM | POA: Diagnosis not present

## 2019-09-18 DIAGNOSIS — Z308 Encounter for other contraceptive management: Secondary | ICD-10-CM

## 2019-09-18 MED ORDER — MEDROXYPROGESTERONE ACETATE 150 MG/ML IM SUSP
150.0000 mg | Freq: Once | INTRAMUSCULAR | Status: AC
  Administered 2019-09-18: 150 mg via INTRAMUSCULAR

## 2019-09-18 NOTE — Patient Instructions (Signed)
Next Depo due 12/04/2019-12/18/19

## 2019-12-04 ENCOUNTER — Other Ambulatory Visit: Payer: Self-pay

## 2019-12-04 ENCOUNTER — Other Ambulatory Visit: Payer: Medicaid Other

## 2019-12-04 MED ORDER — MEDROXYPROGESTERONE ACETATE 150 MG/ML IM SUSP: 150.0000 mg | INTRAMUSCULAR | 0 refills | Status: DC

## 2019-12-05 ENCOUNTER — Other Ambulatory Visit: Payer: Medicaid Other

## 2019-12-09 ENCOUNTER — Encounter: Payer: Self-pay | Admitting: Family Medicine

## 2019-12-09 ENCOUNTER — Other Ambulatory Visit: Payer: Self-pay

## 2019-12-09 ENCOUNTER — Telehealth (INDEPENDENT_AMBULATORY_CARE_PROVIDER_SITE_OTHER): Payer: Medicaid Other | Admitting: Family Medicine

## 2019-12-09 DIAGNOSIS — J329 Chronic sinusitis, unspecified: Secondary | ICD-10-CM | POA: Diagnosis not present

## 2019-12-09 DIAGNOSIS — J310 Chronic rhinitis: Secondary | ICD-10-CM | POA: Diagnosis not present

## 2019-12-09 MED ORDER — AZITHROMYCIN 250 MG PO TABS: ORAL_TABLET | ORAL | 0 refills | Status: DC

## 2019-12-09 NOTE — Progress Notes (Signed)
   Subjective:    Patient ID: Stephanie Torres, female    DOB: [birth year], 24 y.o.   MRN: [id number]  Sore Throat  This is a new problem. Episode onset: 1 week ago. The maximum temperature recorded prior to her arrival was 100.4 - 100.9 F. Associated symptoms include congestion, coughing, a hoarse voice and vomiting. Treatments tried: zyrtec, robitussin  The treatment provided no relief.  Pain start off sore throat and head congestion drainage body aches not feeling good    Review of Systems  HENT: Positive for congestion and hoarse voice.   Respiratory: Positive for cough.   Gastrointestinal: Positive for vomiting.       Objective:   Physical Exam  Today's visit was via telephone Physical exam was not possible for this visit       Assessment & Plan:  Viral syndrome Secondary rhinosinusitis Antibiotic prescribed Cannot rule out Covid Work excuse given for this week Covid testing ordered patient will come here to have this done

## 2019-12-10 LAB — SARS-COV-2, NAA 2 DAY TAT

## 2019-12-10 LAB — NOVEL CORONAVIRUS, NAA: SARS-CoV-2, NAA: NOT DETECTED

## 2019-12-23 ENCOUNTER — Other Ambulatory Visit: Payer: Medicaid Other

## 2019-12-24 ENCOUNTER — Other Ambulatory Visit (INDEPENDENT_AMBULATORY_CARE_PROVIDER_SITE_OTHER): Payer: Medicaid Other | Admitting: *Deleted

## 2019-12-24 ENCOUNTER — Other Ambulatory Visit: Payer: Self-pay

## 2019-12-24 DIAGNOSIS — Z309 Encounter for contraceptive management, unspecified: Secondary | ICD-10-CM | POA: Diagnosis not present

## 2019-12-24 LAB — POCT URINE PREGNANCY: Preg Test, Ur: NEGATIVE

## 2019-12-24 MED ORDER — MEDROXYPROGESTERONE ACETATE 150 MG/ML IM SUSP
150.0000 mg | Freq: Once | INTRAMUSCULAR | Status: AC
  Administered 2019-12-24: 150 mg via INTRAMUSCULAR

## 2020-01-01 ENCOUNTER — Telehealth: Payer: Medicaid Other | Admitting: Family Medicine

## 2020-01-01 ENCOUNTER — Encounter: Payer: Self-pay | Admitting: Nurse Practitioner

## 2020-01-01 ENCOUNTER — Other Ambulatory Visit: Payer: Self-pay

## 2020-01-01 ENCOUNTER — Ambulatory Visit: Payer: Medicaid Other | Admitting: Nurse Practitioner

## 2020-01-01 VITALS — BP 128/78 | Temp 96.7°F | Wt 269.2 lb

## 2020-01-01 DIAGNOSIS — R109 Unspecified abdominal pain: Secondary | ICD-10-CM

## 2020-01-01 DIAGNOSIS — Z87442 Personal history of urinary calculi: Secondary | ICD-10-CM | POA: Diagnosis not present

## 2020-01-01 DIAGNOSIS — N390 Urinary tract infection, site not specified: Secondary | ICD-10-CM

## 2020-01-01 DIAGNOSIS — R319 Hematuria, unspecified: Secondary | ICD-10-CM

## 2020-01-01 LAB — POCT URINALYSIS DIPSTICK (MANUAL)
Nitrite, UA: NEGATIVE
Poct Blood: NEGATIVE
Poct Ketones: NEGATIVE
Poct Protein: NEGATIVE mg/dL
Spec Grav, UA: 1.015 (ref 1.010–1.025)
pH, UA: 6 (ref 5.0–8.0)

## 2020-01-01 LAB — POCT UA - MICROSCOPIC ONLY: WBC, Ur, HPF, POC: >10

## 2020-01-01 MED ORDER — CIPROFLOXACIN HCL 500 MG PO TABS
500.0000 mg | ORAL_TABLET | Freq: Two times a day (BID) | ORAL | 0 refills | Status: DC
Start: 2020-01-01 — End: 2020-01-12

## 2020-01-01 NOTE — Progress Notes (Addendum)
Subjective:    Patient ID: Stephanie Torres, female    DOB: Oct 02, 1995, 24 y.o.   MRN: [id number]  HPI Pt here for recheck of kidney stones. Pt has had chronic right side flank pain since her initial diagnosis on 4/29. Pt has tried home remedies to shrink stone. Taking baby aspirin to soothe pain, helps some. Pt has been drinking cran-grape juice and water. Pain worse when moving and at night when in bed.  Drinking soda makes pain worse.   No c/o pain at this time. On Depo Provera for birth control.  Stopped her Flomax on her own since she did not feel it was helping.    Review of Systems  Constitutional: Negative for activity change, appetite change, fatigue and fever.  Respiratory: Negative for cough and shortness of breath.   Cardiovascular: Negative for chest pain, palpitations and leg swelling.  Gastrointestinal: Positive for abdominal pain. Negative for constipation, diarrhea, nausea and vomiting.  Genitourinary: Positive for flank pain. Negative for dysuria, enuresis, frequency, hematuria, pelvic pain and urgency.        Objective:   Physical Exam Constitutional:      General: She is not in acute distress.    Appearance: Normal appearance. She is obese.  Cardiovascular:     Rate and Rhythm: Normal rate and regular rhythm.     Pulses: Normal pulses.     Heart sounds: Normal heart sounds.  Pulmonary:     Effort: Pulmonary effort is normal. No respiratory distress.     Breath sounds: Normal breath sounds.  Abdominal:     Palpations: Abdomen is soft. There is no mass.     Tenderness: There is abdominal tenderness. There is no right CVA tenderness or left CVA tenderness.     Comments: Pain mainly in the mid to lower right flank area going into the mid right abdomen.    Skin:    General: Skin is warm and dry.  Neurological:     Mental Status: She is alert and oriented to person, place, and time.    Results for orders placed or performed in visit on 01/01/20  POCT  Urinalysis Dip Manual  Result Value Ref Range   Spec Grav, UA 1.015 1.010 - 1.025   pH, UA 6.0 5.0 - 8.0   Leukocytes, UA 4+ (A) Negative   Nitrite, UA Negative Negative   Poct Protein Negative Negative, trace mg/dL   Poct Glucose     Poct Ketones Negative Negative   Poct Urobilinogen     Poct Bilirubin     Poct Blood Negative Negative, trace  POCT UA - Microscopic Only  Result Value Ref Range   WBC, Ur, HPF, POC >10 per HPF    RBC, urine, microscopic occas    Bacteria, U Microscopic occas    Mucus, UA     Epithelial cells, urine per micros rare    Crystals, Ur, HPF, POC     Casts, Ur, LPF, POC     Yeast, UA     Today's Vitals   01/01/20 1416  BP: 128/78  Temp: (!) 96.7 F (35.9 C)  Weight: (!) 269 lb 3.2 oz (122.1 kg)   Body mass index is 47.69 kg/m.        Assessment & Plan:  Hematuria, unspecified type - Plan: Urine Culture, POCT UA - Microscopic Only, Ambulatory referral to Urology  Flank pain - Plan: POCT Urinalysis Dip Manual, Urine Culture, POCT UA - Microscopic Only, Ambulatory referral to Urology  History of renal stone - Plan: Ambulatory referral to Urology  Acute UTI   Meds ordered this encounter  Medications  . ciprofloxacin (CIPRO) 500 MG tablet    Sig: Take 1 tablet (500 mg total) by mouth 2 (two) times daily.    Dispense:  14 tablet    Refill:  0    Order Specific Question:   Supervising Provider    Answer:   Kathyrn Drown [phone]     Spoke with patient after the visit around 5 pm after reviewing microscopic exam. Antibiotic prescribed. Urine culture ordered.  Refer to urology for reevaluation of right kidney stone.  Warning signs reviewed including fever, vomiting or worsening pain. Increase fluid intake.  Call office or go to ED if new or worsening symptoms. Patient agrees with this plan.

## 2020-01-03 LAB — URINE CULTURE

## 2020-01-03 LAB — SPECIMEN STATUS REPORT

## 2020-01-05 ENCOUNTER — Encounter (HOSPITAL_COMMUNITY): Payer: Self-pay | Admitting: *Deleted

## 2020-01-05 ENCOUNTER — Emergency Department (HOSPITAL_COMMUNITY): Payer: Medicaid Other

## 2020-01-05 ENCOUNTER — Emergency Department (HOSPITAL_COMMUNITY)
Admission: EM | Admit: 2020-01-05 | Discharge: 2020-01-05 | Disposition: A | Discharge status: Home / Self Care | Payer: Medicaid Other | Attending: Emergency Medicine | Admitting: Emergency Medicine

## 2020-01-05 ENCOUNTER — Encounter: Payer: Self-pay | Admitting: Nurse Practitioner

## 2020-01-05 DIAGNOSIS — N132 Hydronephrosis with renal and ureteral calculous obstruction: Secondary | ICD-10-CM | POA: Diagnosis not present

## 2020-01-05 DIAGNOSIS — R109 Unspecified abdominal pain: Secondary | ICD-10-CM | POA: Diagnosis present

## 2020-01-05 DIAGNOSIS — N200 Calculus of kidney: Secondary | ICD-10-CM

## 2020-01-05 DIAGNOSIS — Z79899 Other long term (current) drug therapy: Secondary | ICD-10-CM | POA: Insufficient documentation

## 2020-01-05 DIAGNOSIS — J45909 Unspecified asthma, uncomplicated: Secondary | ICD-10-CM | POA: Diagnosis not present

## 2020-01-05 DIAGNOSIS — E669 Obesity, unspecified: Secondary | ICD-10-CM | POA: Insufficient documentation

## 2020-01-05 DIAGNOSIS — N1330 Unspecified hydronephrosis: Secondary | ICD-10-CM

## 2020-01-05 LAB — BASIC METABOLIC PANEL
Anion gap: 8 (ref 5–15)
BUN: 18 mg/dL (ref 6–20)
CO2: 25 mmol/L (ref 22–32)
Calcium: 9 mg/dL (ref 8.9–10.3)
Chloride: 104 mmol/L (ref 98–111)
Creatinine, Ser: 0.79 mg/dL (ref 0.44–1.00)
GFR calc Af Amer: >60 mL/min (ref >60)
GFR calc non Af Amer: >60 mL/min (ref >60)
Glucose, Bld: 102 mg/dL — ABNORMAL HIGH (ref 70–99)
Potassium: 4.3 mmol/L (ref 3.5–5.1)
Sodium: 137 mmol/L (ref 135–145)

## 2020-01-05 LAB — CBC
HCT: 37.1 % (ref 36.0–46.0)
Hemoglobin: 11.1 g/dL — ABNORMAL LOW (ref 12.0–15.0)
MCH: 20.4 pg — ABNORMAL LOW (ref 26.0–34.0)
MCHC: 29.9 g/dL — ABNORMAL LOW (ref 30.0–36.0)
MCV: 68.2 fL — ABNORMAL LOW (ref 80.0–100.0)
Platelets: 455 10*3/uL — ABNORMAL HIGH (ref 150–400)
RBC: 5.44 MIL/uL — ABNORMAL HIGH (ref 3.87–5.11)
RDW: 19.3 % — ABNORMAL HIGH (ref 11.5–15.5)
WBC: 13.8 10*3/uL — ABNORMAL HIGH (ref 4.0–10.5)
nRBC: 0 % (ref 0.0–0.2)

## 2020-01-05 LAB — URINALYSIS, ROUTINE W REFLEX MICROSCOPIC
Bacteria, UA: NONE SEEN
Bilirubin Urine: NEGATIVE
Glucose, UA: NEGATIVE mg/dL
Ketones, ur: NEGATIVE mg/dL
Nitrite: NEGATIVE
Protein, ur: NEGATIVE mg/dL
Specific Gravity, Urine: 1.014 (ref 1.005–1.030)
pH: 6 (ref 5.0–8.0)

## 2020-01-05 LAB — POC URINE PREG, ED: Preg Test, Ur: NEGATIVE

## 2020-01-05 MED ORDER — KETOROLAC TROMETHAMINE 15 MG/ML IJ SOLN
15.0000 mg | Freq: Once | INTRAMUSCULAR | Status: AC
  Administered 2020-01-05: 15 mg via INTRAVENOUS
  Filled 2020-01-05: qty 1

## 2020-01-05 NOTE — Discharge Instructions (Signed)
Drink plenty of fluids Take Ibuprofen or Aleve for pain Follow up with Urology - they should contact you this week but if you haven't heard back by the end of the week please call their office Return to the ER if symptoms are worsening

## 2020-01-05 NOTE — ED Triage Notes (Signed)
Pain in right flank, history of kidney stone

## 2020-01-05 NOTE — ED Notes (Signed)
Patient transported to CT 

## 2020-01-05 NOTE — Progress Notes (Signed)
   Subjective:    Patient ID: Stephanie Torres, female    DOB: [birth year], 24 y.o.   MRN: [id number]  HPI  See other note.  Review of Systems     Objective:   Physical Exam        Assessment & Plan:

## 2020-01-05 NOTE — ED Provider Notes (Signed)
[facility] EMERGENCY DEPARTMENT Provider Note   CSN: [id number] Arrival date & time: 01/05/20  1043     History Chief Complaint  Patient presents with  . Flank Pain    Stephanie Torres is a 24 y.o. female with hx of kidney stones who presents with R flank pain.  She states that she was originally diagnosed with a 72mm right-sided kidney stone a year ago.  She was referred to urology however never followed up because she states she was scared.  She states that she will have pain and nausea on and off in the right flank and she usually will take aspirin, Tylenol, Aleve for the pain which will help.  Over the past couple months the pain has been gradually worsening.  It is now starting to radiate into the right groin which is where she feels the pain the most.  She had an appointment with her doctor who referred her to urology and she is waiting for an appointment however pain is become so severe and she states that she is sick of it so she came to the ER today.  She states that sometimes when she lies down she will get cramping in the left side of her back as well.  She is unsure of fever because she states she always feels hot.  No chills, sweats, nausea, vomiting.  She denies dysuria, hematuria, difficulty urinating but states that she has urinary frequency and when she does urinate she will pee for about 2 minutes.  HPI     Past Medical History:  Diagnosis Date  . Anxiety   . Asthma   . Depression   . DUB (dysfunctional uterine bleeding)   . PCOS (polycystic ovarian syndrome)     Patient Active Problem List   Diagnosis Date Noted  . Suicidal overdose, sequela ([place]) 04/27/2017  . Anxiety and depression 04/27/2017  . Depression 04/18/2016  . Ingrown toenail 04/12/2013  . PCOS (polycystic ovarian syndrome) 04/11/2013  . Hyperinsulinemia 11/21/2012  . Iron deficiency anemia 11/21/2012  . Other and unspecified hyperlipidemia 11/21/2012  . Gastritis 10/31/2012  . Migraine headache  without aura 10/31/2012  . Asthma with acute exacerbation 10/31/2012  . Morbid obesity ([place]) 10/31/2012    Past Surgical History:  Procedure Laterality Date  . CESAREAN SECTION    . TONSILLECTOMY       OB History    Gravida  1   Para  1   Term      Preterm      AB      Living        SAB      TAB      Ectopic      Multiple      Live Births              Family History  Problem Relation Age of Onset  . Diabetes Mother   . Hypertension Mother     Social History   Tobacco Use  . Smoking status: Never Smoker  . Smokeless tobacco: Never Used  Vaping Use  . Vaping Use: Never used  Substance Use Topics  . Alcohol use: No  . Drug use: No    Home Medications Prior to Admission medications   Medication Sig Start Date End Date Taking? Authorizing Provider  albuterol (PROVENTIL) (2.5 MG/3ML) 0.083% nebulizer solution Take 3 mLs (2.5 mg total) by nebulization every 6 (six) hours as needed for wheezing or shortness of breath. 04/18/19   Baltazar Apo  S, MD  albuterol (VENTOLIN HFA) 108 (90 Base) MCG/ACT inhaler Inhale 2 puffs into the lungs every 6 (six) hours as needed for wheezing or shortness of breath. 07/22/19   Mikey Kirschner, MD  budesonide-formoterol (SYMBICORT) 80-4.5 MCG/ACT inhaler Inhale 2 puffs into the lungs 2 (two) times daily. 05/07/19   Mikey Kirschner, MD  ciprofloxacin (CIPRO) 500 MG tablet Take 1 tablet (500 mg total) by mouth 2 (two) times daily. 01/01/20   Nilda Simmer, NP  FLUoxetine (PROZAC) 20 MG capsule TAKE ONE CAPSULE BY MOUTH EVERY DAY Patient not taking: Reported on 01/01/2020 03/20/19   Mikey Kirschner, MD  medroxyPROGESTERone (DEPO-PROVERA) 150 MG/ML injection Inject 1 mL (150 mg total) into the muscle every 3 (three) months. 12/04/19   Nilda Simmer, NP    Allergies    Zoloft [sertraline hcl]  Review of Systems   Review of Systems  Constitutional: Negative for chills and fever.  Respiratory: Negative for  shortness of breath.   Cardiovascular: Negative for chest pain.  Gastrointestinal: Negative for abdominal pain, diarrhea, nausea and vomiting.  Genitourinary: Positive for flank pain and pelvic pain. Negative for difficulty urinating, dysuria, frequency, vaginal bleeding, vaginal discharge and vaginal pain.  All other systems reviewed and are negative.   Physical Exam Updated Vital Signs BP 129/79 (BP Location: Right Arm)   Pulse 78   Temp 98 F (36.7 C) (Oral)   Resp 20   SpO2 98%   Physical Exam Vitals and nursing note reviewed.  Constitutional:      General: She is not in acute distress.    Appearance: She is well-developed. She is obese. She is not ill-appearing.  HENT:     Head: Normocephalic and atraumatic.  Eyes:     General: No scleral icterus.       Right eye: No discharge.        Left eye: No discharge.     Conjunctiva/sclera: Conjunctivae normal.     Pupils: Pupils are equal, round, and reactive to light.  Cardiovascular:     Rate and Rhythm: Normal rate and regular rhythm.  Pulmonary:     Effort: Pulmonary effort is normal. No respiratory distress.     Breath sounds: Normal breath sounds.  Abdominal:     General: There is no distension.     Palpations: Abdomen is soft.     Tenderness: There is abdominal tenderness (right inguinal). There is right CVA tenderness.  Musculoskeletal:     Cervical back: Normal range of motion.  Skin:    General: Skin is warm and dry.  Neurological:     Mental Status: She is alert and oriented to person, place, and time.  Psychiatric:        Behavior: Behavior normal.     ED Results / Procedures / Treatments   Labs (all labs ordered are listed, but only abnormal results are displayed) Labs Reviewed  URINALYSIS, ROUTINE W REFLEX MICROSCOPIC - Abnormal; Notable for the following components:      Result Value   Hgb urine dipstick SMALL (*)    Leukocytes,Ua SMALL (*)    All other components within normal limits  BASIC  METABOLIC PANEL - Abnormal; Notable for the following components:   Glucose, Bld 102 (*)    All other components within normal limits  CBC - Abnormal; Notable for the following components:   WBC 13.8 (*)    RBC 5.44 (*)    Hemoglobin 11.1 (*)    MCV 68.2 (*)  MCH 20.4 (*)    MCHC 29.9 (*)    RDW 19.3 (*)    Platelets 455 (*)    All other components within normal limits  URINE CULTURE  POC URINE PREG, ED    EKG None  Radiology CT Renal Stone Study  Result Date: 01/05/2020 CLINICAL DATA:  Right flank pain x2 days. EXAM: CT ABDOMEN AND PELVIS WITHOUT CONTRAST TECHNIQUE: Multidetector CT imaging of the abdomen and pelvis was performed following the standard protocol without IV contrast. COMPARISON:  October 02, 2018 FINDINGS: Lower chest: No acute abnormality. Hepatobiliary: No focal liver abnormality is seen. No gallstones, gallbladder wall thickening, or biliary dilatation. Pancreas: Unremarkable. No pancreatic ductal dilatation or surrounding inflammatory changes. Spleen: Normal in size without focal abnormality. Adrenals/Urinary Tract: Adrenal glands are unremarkable. Kidneys are normal in size, without focal lesions. A 1.2 cm renal stone is seen within the distal aspect of the right renal pelvis. Mild to moderate severity associated right-sided hydronephrosis is seen. Bladder is unremarkable. Stomach/Bowel: Stomach is within normal limits. Appendix appears normal. No evidence of bowel wall thickening, distention, or inflammatory changes. Vascular/Lymphatic: No significant vascular findings are present. No enlarged abdominal or pelvic lymph nodes. Reproductive: Uterus is positioned to the left of midline and is otherwise normal in appearance. The bilateral adnexa are unremarkable. Other: No abdominal wall hernia or abnormality. No abdominopelvic ascites. Musculoskeletal: No acute or significant osseous findings. IMPRESSION: 1. 1.2 cm partially obstructing renal stone within the distal aspect of  the right renal pelvis with mild to moderate severity associated right-sided hydronephrosis. Electronically Signed   By: Virgina Norfolk M.D.   On: 01/05/2020 15:26    Procedures Procedures (including critical care time)  Medications Ordered in ED Medications  ketorolac (TORADOL) 15 MG/ML injection 15 mg (15 mg Intravenous Given 01/05/20 1303)    ED Course  I have reviewed the triage vital signs and the nursing notes.  Pertinent labs & imaging results that were available during my care of the patient were reviewed by me and considered in my medical decision making (see chart for details).  Clinical Course as of Jan 04 1238  Mon Jan 05, 2020  1227 RBC(!): 5.44 [AS]  1227 Hemoglobin(!): 11.1 [AS]    Clinical Course User Index [AS] Burke Keels, Student-PA   24 year old female presents with R flank pain which has been chronic and intermittent for over a year since being diagnosed last April. Her vitals are normal. She is generally well appearing. She has significant R CVA tenderness and mild R inguinal tenderness. Labs show mild leukocytosis (13) and mild anemia which is chronic. Kidney function is normal. UA has small hgb and small leukocytes but otherwise no other signs of infection. Will obtain CT renal to reassess and given toradol for pain control.  CT shows 1.2 cm obstructing stone in the R renal pelvis with associated mild-mod hydronephrosis. Pt's pain is resolved with toradol. Will consult urology to see if we can get her close f/u.  Discussed with Dr. Jeffie Pollock - he will have his office reach out to her. Pt was offered rx for pain medicine but declined. She will take NSAIDs. Return precautions given.  MDM Rules/Calculators/A&P                            Final Clinical Impression(s) / ED Diagnoses Final diagnoses:  Kidney stone  Hydronephrosis, unspecified hydronephrosis type    Rx / DC Orders ED Discharge Orders  None       Iris Pert 01/05/20  1617    Hayden Rasmussen, MD 01/05/20 [place]

## 2020-01-06 ENCOUNTER — Telehealth: Payer: Self-pay | Admitting: *Deleted

## 2020-01-06 LAB — URINE CULTURE: Culture: NO GROWTH

## 2020-01-06 NOTE — Telephone Encounter (Signed)
Contacted pt to complete transition of care assessment:  Transition Care Management Follow-up Telephone Call  . Medicaid Managed Care Transition Call Status:MM [facility] Call Made  . Date of discharge and from where: [facility], 01/05/20  . How have you been since you were released from the hospital?"better this morning"  . Any questions or concerns? No  Items Reviewed: Marland Kitchen Did the pt receive and understand the discharge instructions provided? Yes  . Medications obtained and verified? n/a . Any new allergies since your discharge? No  . Dietary orders reviewed? Yes . Do you have support at home? Yes, family  Functional Questionnaire: (I = Independent and D = Dependent)  ADLs: Independent Bathing/Dressing:Independent Meal Prep: Independent Eating: Independent Maintaining continence: Independent Transferring/Ambulation: Independent Managing Meds: Independent Follow up appointments reviewed:  PCP Hospital f/u appt confirmed? No  pt advised to contact Dr Elvia Collum on 01/06/20 for follow up appt  Specialist Hospital f/u appt confirmed? Yes pt  Scheduled to seeUrology  on 01/12/20@ 1545  Are transportation arrangements needed? No   If their condition worsens, is the pt aware to call PCP or go to the EmergencyDept.? yes   Was the patient provided with contact information for the PCP's office or ED? yes  Was to pt encouraged to call back with questions or concerns? Yes  Lenor Coffin, RN, BSN, [place] Patient Stephanie Torres [phone]

## 2020-01-08 ENCOUNTER — Encounter: Payer: Self-pay | Admitting: Family Medicine

## 2020-01-12 ENCOUNTER — Encounter: Payer: Self-pay | Admitting: Urology

## 2020-01-12 ENCOUNTER — Ambulatory Visit (INDEPENDENT_AMBULATORY_CARE_PROVIDER_SITE_OTHER): Payer: Medicaid Other | Admitting: Urology

## 2020-01-12 ENCOUNTER — Other Ambulatory Visit: Payer: Self-pay

## 2020-01-12 VITALS — BP 123/87 | HR 111 | Temp 98.3°F | Ht 63.0 in | Wt 269.2 lb

## 2020-01-12 DIAGNOSIS — N200 Calculus of kidney: Secondary | ICD-10-CM

## 2020-01-12 LAB — URINALYSIS, ROUTINE W REFLEX MICROSCOPIC
Bilirubin, UA: NEGATIVE
Glucose, UA: NEGATIVE
Ketones, UA: NEGATIVE
Nitrite, UA: NEGATIVE
Specific Gravity, UA: 1.025 (ref 1.005–1.030)
Urobilinogen, Ur: 0.2 mg/dL (ref 0.2–1.0)
pH, UA: 5.5 (ref 5.0–7.5)

## 2020-01-12 LAB — MICROSCOPIC EXAMINATION
Renal Epithel, UA: NONE SEEN /hpf
WBC, UA: >30 /hpf — AB (ref 0–5)

## 2020-01-12 NOTE — Patient Instructions (Signed)

## 2020-01-12 NOTE — Progress Notes (Signed)
Urological Symptom Review  Patient is experiencing the following symptoms: Frequent urination Get up at night to urinate Leakage of urine Blood in urine Painful intercourse  hard to postpone urine Kidney stones   Review of Systems  Gastrointestinal (upper)  : Nausea  Gastrointestinal (lower) : Negative for lower GI symptoms  Constitutional : Fatigue  Skin: Negative for skin symptoms  Eyes: Negative for eye symptoms  Ear/Nose/Throat : Sinus problems  Hematologic/Lymphatic: Negative for Hematologic/Lymphatic symptoms  Cardiovascular : Negative for cardiovascular symptoms  Respiratory : Negative for respiratory symptoms  Endocrine: Excessive thirst  Musculoskeletal: Back pain  Neurological: Headaches  Psychologic: Anxiety

## 2020-01-12 NOTE — Progress Notes (Signed)
01/12/2020 4:35 PM   Emilio Math 08/14/1995 [id number]  Referring provider: Erven Colla, DO [place],  [place] [address]  nephrolithiasis  HPI: Ms Stephanie Torres is a 24yo here for evaluation of nephrolithiasis. She was seen in the ER on 01/05/2020 with right flank pain and was diagnosed with a 1.2cm right UPJ calculus. She has been having intermittent right flank over the past year. She had a a CT stone study in 4/202 which showed the same stone and at that time was 83mm. This is her first stone event. No family hx of nephrolithiasis. Pain is mild currently . No LUTS.    PMH: Past Medical History:  Diagnosis Date   Anxiety    Asthma    Depression    DUB (dysfunctional uterine bleeding)    Heartburn    PCOS (polycystic ovarian syndrome)     Surgical History: Past Surgical History:  Procedure Laterality Date   CESAREAN SECTION     TONSILLECTOMY      Home Medications:  Allergies as of 01/12/2020      Reactions   Zoloft [sertraline Hcl] Other (See Comments)   Suicidal thoughts or ideation      Medication List       Accurate as of January 12, 2020  4:35 PM. If you have any questions, ask your nurse or doctor.        STOP taking these medications   ciprofloxacin 500 MG tablet Commonly known as: CIPRO Stopped by: Nicolette Bang, MD   FLUoxetine 20 MG capsule Commonly known as: PROZAC Stopped by: Nicolette Bang, MD     TAKE these medications   albuterol (2.5 MG/3ML) 0.083% nebulizer solution Commonly known as: PROVENTIL Take 3 mLs (2.5 mg total) by nebulization every 6 (six) hours as needed for wheezing or shortness of breath.   albuterol 108 (90 Base) MCG/ACT inhaler Commonly known as: VENTOLIN HFA Inhale 2 puffs into the lungs every 6 (six) hours as needed for wheezing or shortness of breath.   budesonide-formoterol 80-4.5 MCG/ACT inhaler Commonly known as: SYMBICORT Inhale 2 puffs into the lungs 2 (two) times daily.     medroxyPROGESTERone 150 MG/ML injection Commonly known as: DEPO-PROVERA Inject 1 mL (150 mg total) into the muscle every 3 (three) months.       Allergies:  Allergies  Allergen Reactions   Zoloft [Sertraline Hcl] Other (See Comments)    Suicidal thoughts or ideation    Family History: Family History  Problem Relation Age of Onset   Diabetes Mother    Hypertension Mother     Social History:  reports that she has never smoked. She has never used smokeless tobacco. She reports that she does not drink alcohol and does not use drugs.  ROS: All other review of systems were reviewed and are negative except what is noted above in HPI  Physical Exam: BP 123/87    Pulse (!) 111    Temp 98.3 F (36.8 C)    Ht 5\' 3"  (1.6 m)    Wt 269 lb 3.2 oz (122.1 kg)    BMI 47.69 kg/m   Constitutional:  Alert and oriented, No acute distress. HEENT: Media AT, moist mucus membranes.  Trachea midline, no masses. Cardiovascular: No clubbing, cyanosis, or edema. Respiratory: Normal respiratory effort, no increased work of breathing. GI: Abdomen is soft, nontender, nondistended, no abdominal masses GU: No CVA tenderness.  Lymph: No cervical or inguinal lymphadenopathy. Skin: No rashes, bruises or suspicious lesions. Neurologic: Grossly intact, no  focal deficits, moving all 4 extremities. Psychiatric: Normal mood and affect.  Laboratory Data: Lab Results  Component Value Date   WBC 13.8 (H) 01/05/2020   HGB 11.1 (L) 01/05/2020   HCT 37.1 01/05/2020   MCV 68.2 (L) 01/05/2020   PLT 455 (H) 01/05/2020    Lab Results  Component Value Date   CREATININE 0.79 01/05/2020    No results found for: PSA  No results found for: TESTOSTERONE  Lab Results  Component Value Date   HGBA1C 5.6 01/01/2014    Urinalysis    Component Value Date/Time   COLORURINE YELLOW 01/05/2020 1113   APPEARANCEUR Cloudy (A) 01/12/2020 1621   LABSPEC 1.014 01/05/2020 1113   PHURINE 6.0 01/05/2020 1113   GLUCOSEU  Negative 01/12/2020 1621   HGBUR SMALL (A) 01/05/2020 1113   BILIRUBINUR Negative 01/12/2020 1621   KETONESUR NEGATIVE 01/05/2020 1113   PROTEINUR 1+ (A) 01/12/2020 1621   PROTEINUR NEGATIVE 01/05/2020 1113   UROBILINOGEN 0.2 09/14/2013 2238   NITRITE Negative 01/12/2020 1621   NITRITE NEGATIVE 01/05/2020 1113   LEUKOCYTESUR 2+ (A) 01/12/2020 1621   LEUKOCYTESUR SMALL (A) 01/05/2020 1113    Lab Results  Component Value Date   LABMICR See below: 01/12/2020   WBCUA >30 (A) 01/12/2020   LABEPIT 0-10 01/12/2020   BACTERIA Few (A) 01/12/2020    Pertinent Imaging: CT 01/05/2020: Images reviewed and discussed with the patient  No results found for this or any previous visit.  No results found for this or any previous visit.  No results found for this or any previous visit.  No results found for this or any previous visit.  No results found for this or any previous visit.  No results found for this or any previous visit.  No results found for this or any previous visit.  Results for orders placed during the hospital encounter of 01/05/20  CT Renal Stone Study  Narrative CLINICAL DATA:  Right flank pain x2 days.  EXAM: CT ABDOMEN AND PELVIS WITHOUT CONTRAST  TECHNIQUE: Multidetector CT imaging of the abdomen and pelvis was performed following the standard protocol without IV contrast.  COMPARISON:  October 02, 2018  FINDINGS: Lower chest: No acute abnormality.  Hepatobiliary: No focal liver abnormality is seen. No gallstones, gallbladder wall thickening, or biliary dilatation.  Pancreas: Unremarkable. No pancreatic ductal dilatation or surrounding inflammatory changes.  Spleen: Normal in size without focal abnormality.  Adrenals/Urinary Tract: Adrenal glands are unremarkable. Kidneys are normal in size, without focal lesions. A 1.2 cm renal stone is seen within the distal aspect of the right renal pelvis. Mild to moderate severity associated right-sided  hydronephrosis is seen. Bladder is unremarkable.  Stomach/Bowel: Stomach is within normal limits. Appendix appears normal. No evidence of bowel wall thickening, distention, or inflammatory changes.  Vascular/Lymphatic: No significant vascular findings are present. No enlarged abdominal or pelvic lymph nodes.  Reproductive: Uterus is positioned to the left of midline and is otherwise normal in appearance. The bilateral adnexa are unremarkable.  Other: No abdominal wall hernia or abnormality. No abdominopelvic ascites.  Musculoskeletal: No acute or significant osseous findings.  IMPRESSION: 1. 1.2 cm partially obstructing renal stone within the distal aspect of the right renal pelvis with mild to moderate severity associated right-sided hydronephrosis.   Electronically Signed By: Virgina Norfolk M.D. On: 01/05/2020 15:26   Assessment & Plan:    1. Kidney stones -We discussed the management of kidney stones. These options include observation, ureteroscopy, shockwave lithotripsy (ESWL) and percutaneous nephrolithotomy (PCNL). We  discussed which options are relevant to the patient's stone(s). We discussed the natural history of kidney stones as well as the complications of untreated stones and the impact on quality of life without treatment as well as with each of the above listed treatments. We also discussed the efficacy of each treatment in its ability to clear the stone burden. With any of these management options I discussed the signs and symptoms of infection and the need for emergent treatment should these be experienced. For each option we discussed the ability of each procedure to clear the patient of their stone burden.   For observation I described the risks which include but are not limited to silent renal damage, life-threatening infection, need for emergent surgery, failure to pass stone and pain.   For ureteroscopy I described the risks which include bleeding,  infection, damage to contiguous structures, positioning injury, ureteral stricture, ureteral avulsion, ureteral injury, need for prolonged ureteral stent, inability to perform ureteroscopy, need for an interval procedure, inability to clear stone burden, stent discomfort/pain, heart attack, stroke, pulmonary embolus and the inherent risks with general anesthesia.   For shockwave lithotripsy I described the risks which include arrhythmia, kidney contusion, kidney hemorrhage, need for transfusion, pain, inability to adequately break up stone, inability to pass stone fragments, Steinstrasse, infection associated with obstructing stones, need for alternate surgical procedure, need for repeat shockwave lithotripsy, MI, CVA, PE and the inherent risks with anesthesia/conscious sedation.   For PCNL I described the risks including positioning injury, pneumothorax, hydrothorax, need for chest tube, inability to clear stone burden, renal laceration, arterial venous fistula or malformation, need for embolization of kidney, loss of kidney or renal function, need for repeat procedure, need for prolonged nephrostomy tube, ureteral avulsion, MI, CVA, PE and the inherent risks of general anesthesia.   - The patient would like to proceed with right ureteroscopic stone extraction.    - Urinalysis, Routine w reflex microscopic   No follow-ups on file.  Nicolette Bang, MD  [facility] [place]

## 2020-01-14 ENCOUNTER — Other Ambulatory Visit: Payer: Self-pay | Admitting: Family Medicine

## 2020-01-14 ENCOUNTER — Telehealth: Payer: Self-pay | Admitting: Family Medicine

## 2020-01-14 MED ORDER — IBUPROFEN 600 MG PO TABS
600.0000 mg | ORAL_TABLET | Freq: Four times a day (QID) | ORAL | 1 refills | Status: DC | PRN
Start: 2020-01-14 — End: 2020-05-04

## 2020-01-14 MED ORDER — HYDROCODONE-ACETAMINOPHEN 10-325 MG PO TABS: 1.0000 | ORAL_TABLET | ORAL | 0 refills | Status: AC | PRN

## 2020-01-14 NOTE — Telephone Encounter (Signed)
Error

## 2020-01-14 NOTE — Telephone Encounter (Signed)
Please advise. Thank you

## 2020-01-14 NOTE — Telephone Encounter (Signed)
Pt was seen in the [facility] she was diagnosed with a kidney stone. She was told to follow up with primary care dr. The stone is 12 mm big and will require surgery. We made her appt for Friday at 10:00. Pt is needing a pain med call in to pharmacy.  [place] [phone] Pt contact number [phone]

## 2020-01-14 NOTE — Telephone Encounter (Signed)
Nurses Please let the patient know that medical studies show ibuprofen does help take the edge off the pain I would recommend 600 mg ibuprofen every 6 hours as needed severe pain, I have also sent in hydrocodone for severe pain caution drowsiness not for frequent use or long-term use caution with drowsiness with use therefore do not drive when taking keep follow-up visit thank you

## 2020-01-15 NOTE — Telephone Encounter (Signed)
Pt contacted and verbalized understanding.  

## 2020-01-16 ENCOUNTER — Other Ambulatory Visit: Payer: Self-pay

## 2020-01-16 ENCOUNTER — Ambulatory Visit (INDEPENDENT_AMBULATORY_CARE_PROVIDER_SITE_OTHER): Payer: Medicaid Other | Admitting: Family Medicine

## 2020-01-16 ENCOUNTER — Encounter: Payer: Self-pay | Admitting: Family Medicine

## 2020-01-16 VITALS — BP 132/80 | HR 77 | Temp 98.3°F | Wt 272.0 lb

## 2020-01-16 DIAGNOSIS — N200 Calculus of kidney: Secondary | ICD-10-CM

## 2020-01-16 DIAGNOSIS — S9001XA Contusion of right ankle, initial encounter: Secondary | ICD-10-CM

## 2020-01-16 NOTE — Progress Notes (Signed)
Patient ID: Stephanie Torres, female    DOB: February 13, 1996, 24 y.o.   MRN: [id number]   Chief Complaint  Patient presents with  . Hospitalization Follow-up    kidney stone. Had appointment with Urology and is waiting for surgery to be scheduled.   . Ankle Pain    Patient had a fall the other day and complains of right ankle pain.   Subjective:    HPI Pt recent went to ER 01/05/20- pt had rt flank pain and h/o stone in rt kidney.  And pt was putting it off.  And pt spoke with renal doc and they want to remove it. Went this week to office, and they will call her to schedule in 1-2 wks to remove. Lithrotripsy. 1.2 right UPJ calculus. Seeing urology and they are going to do rt uretreroscopic stone extraction with shockwave lithotripsy.   CT renal IMPRESSION: 1. 1.2 cm partially obstructing renal stone within the distal aspect of the right renal pelvis with mild to moderate severity associated right-sided hydronephrosis  Pt was rushing out the door and fell on the rt ankle.  Landed on the rt ankle and hurting. 2 days ago. Been aleve and 800mg  ibuprofen. Has some hydrocodone in case for severe pain.   Medical History Stephanie Torres has a past medical history of Anxiety, Asthma, Depression, DUB (dysfunctional uterine bleeding), Heartburn, and PCOS (polycystic ovarian syndrome).   Outpatient Encounter Medications as of 01/16/2020  Medication Sig  . albuterol (PROVENTIL) (2.5 MG/3ML) 0.083% nebulizer solution Take 3 mLs (2.5 mg total) by nebulization every 6 (six) hours as needed for wheezing or shortness of breath.  Marland Kitchen albuterol (VENTOLIN HFA) 108 (90 Base) MCG/ACT inhaler Inhale 2 puffs into the lungs every 6 (six) hours as needed for wheezing or shortness of breath.  . budesonide-formoterol (SYMBICORT) 80-4.5 MCG/ACT inhaler Inhale 2 puffs into the lungs 2 (two) times daily.  . [EXPIRED] HYDROcodone-acetaminophen (NORCO) 10-325 MG tablet Take 1 tablet by mouth every 4 (four) hours as needed  for up to 5 days.  Marland Kitchen ibuprofen (ADVIL) 600 MG tablet Take 1 tablet (600 mg total) by mouth every 6 (six) hours as needed.  . medroxyPROGESTERone (DEPO-PROVERA) 150 MG/ML injection Inject 1 mL (150 mg total) into the muscle every 3 (three) months.   No facility-administered encounter medications on file as of 01/16/2020.     Review of Systems  Constitutional: Negative for chills and fever.  HENT: Negative for congestion, rhinorrhea and sore throat.   Respiratory: Negative for cough, shortness of breath and wheezing.   Cardiovascular: Negative for chest pain and leg swelling.  Gastrointestinal: Negative for abdominal pain, diarrhea, nausea and vomiting.  Genitourinary: Positive for flank pain. Negative for difficulty urinating, dysuria, frequency and hematuria.  Musculoskeletal: Negative for arthralgias and back pain.       +rt ankle pain  Skin: Negative for rash.  Neurological: Negative for dizziness, weakness and headaches.     Vitals BP 132/80   Pulse 77   Temp 98.3 F (36.8 C)   Wt 272 lb (123.4 kg)   SpO2 99%   BMI 48.18 kg/m   Objective:   Physical Exam Vitals and nursing note reviewed.  Constitutional:      Appearance: Normal appearance.  HENT:     Head: Normocephalic and atraumatic.     Nose: Nose normal.     Mouth/Throat:     Mouth: Mucous membranes are moist.     Pharynx: Oropharynx is clear.  Eyes:  Extraocular Movements: Extraocular movements intact.     Conjunctiva/sclera: Conjunctivae normal.     Pupils: Pupils are equal, round, and reactive to light.  Cardiovascular:     Rate and Rhythm: Normal rate and regular rhythm.     Pulses: Normal pulses.     Heart sounds: Normal heart sounds.  Pulmonary:     Effort: Pulmonary effort is normal.     Breath sounds: Normal breath sounds. No wheezing, rhonchi or rales.  Musculoskeletal:        General: Tenderness (rt lateral ankle, mild swelling, no ecchymosis or erythema, normal rom., normal pulse and cap  refill) present. Normal range of motion.     Right lower leg: No edema.     Left lower leg: No edema.  Skin:    General: Skin is warm and dry.     Findings: No lesion or rash.  Neurological:     General: No focal deficit present.     Mental Status: She is alert and oriented to person, place, and time.  Psychiatric:        Mood and Affect: Mood normal.        Behavior: Behavior normal.      Assessment and Plan   1. Right renal stone  2. Contusion of right ankle, initial encounter   F/u with Urology as scheduled.  Take pain medicaitons given as directed.   Contusion rt ankle. Ibuprofen prn, rest, ice, and elevation.  F/u 2months or prn.

## 2020-01-29 ENCOUNTER — Telehealth (INDEPENDENT_AMBULATORY_CARE_PROVIDER_SITE_OTHER): Payer: Medicaid Other | Admitting: Family Medicine

## 2020-01-29 ENCOUNTER — Telehealth: Payer: Self-pay | Admitting: Family Medicine

## 2020-01-29 ENCOUNTER — Other Ambulatory Visit: Payer: Self-pay

## 2020-01-29 ENCOUNTER — Encounter: Payer: Self-pay | Admitting: Family Medicine

## 2020-01-29 DIAGNOSIS — K529 Noninfective gastroenteritis and colitis, unspecified: Secondary | ICD-10-CM

## 2020-01-29 MED ORDER — ONDANSETRON 4 MG PO TBDP: 4.0000 mg | ORAL_TABLET | Freq: Three times a day (TID) | ORAL | 0 refills | Status: DC | PRN

## 2020-01-29 NOTE — Telephone Encounter (Signed)
Ms. dail, meece are scheduled for a virtual visit with your provider today.    Just as we do with appointments in the office, we must obtain your consent to participate.  Your consent will be active for this visit and any virtual visit you may have with one of our providers in the next 365 days.    If you have a MyChart account, I can also send a copy of this consent to you electronically.  All virtual visits are billed to your insurance company just like a traditional visit in the office.  As this is a virtual visit, video technology does not allow for your provider to perform a traditional examination.  This may limit your provider's ability to fully assess your condition.  If your provider identifies any concerns that need to be evaluated in person or the need to arrange testing such as labs, EKG, etc, we will make arrangements to do so.    Although advances in technology are sophisticated, we cannot ensure that it will always work on either your end or our end.  If the connection with a video visit is poor, we may have to switch to a telephone visit.  With either a video or telephone visit, we are not always able to ensure that we have a secure connection.   I need to obtain your verbal consent now.   Are you willing to proceed with your visit today?   MIYA LUVIANO has provided verbal consent on 01/29/2020 for a virtual visit (video or telephone).   Vicente Males, LPN 11/09/3014  0:10 PM

## 2020-01-29 NOTE — Progress Notes (Signed)
Patient ID: Stephanie Torres, female    DOB: 05/26/1996, 26 y.o.   MRN: [id number]  Virtual Visit via Telephone Note  I connected with Stephanie Torres on 01/29/20 at  2:30 PM EDT by telephone and verified that I am speaking with the correct person using two identifiers.  Location: Patient: home Provider: office   I discussed the limitations, risks, security and privacy concerns of performing an evaluation and management service by telephone and the availability of in person appointments. I also discussed with the patient that there may be a patient responsible charge related to this service. The patient expressed understanding and agreed to proceed.  Chief Complaint  Patient presents with  . Emesis   Subjective:    HPI Pt having vomiting and diarrhea. Vomiting started last night and diarrhea this morning. Pt also having right side pain that goes into groin area. Pt has kidney stone on right side and is awaiting surgery. Pt states the pain is awful.  Stomach pain then started having diarrhea.  Vomiting last night, 2-3 x vomiting. Today- no further vomiting. Some dry heaving and nausea. Diarrhea- 4-5 times. No bloody diarrhea.  No sick contacts. No fever.  Some stomach cramping. And having some groin pain and rt flank.   Medical History Neoma has a past medical history of Anxiety, Asthma, Depression, DUB (dysfunctional uterine bleeding), Heartburn, and PCOS (polycystic ovarian syndrome).   Outpatient Encounter Medications as of 01/29/2020  Medication Sig  . albuterol (PROVENTIL) (2.5 MG/3ML) 0.083% nebulizer solution Take 3 mLs (2.5 mg total) by nebulization every 6 (six) hours as needed for wheezing or shortness of breath.  Marland Kitchen albuterol (VENTOLIN HFA) 108 (90 Base) MCG/ACT inhaler Inhale 2 puffs into the lungs every 6 (six) hours as needed for wheezing or shortness of breath.  . budesonide-formoterol (SYMBICORT) 80-4.5 MCG/ACT inhaler Inhale 2 puffs into the lungs 2 (two)  times daily.  Marland Kitchen ibuprofen (ADVIL) 600 MG tablet Take 1 tablet (600 mg total) by mouth every 6 (six) hours as needed.  . medroxyPROGESTERone (DEPO-PROVERA) 150 MG/ML injection Inject 1 mL (150 mg total) into the muscle every 3 (three) months.  . ondansetron (ZOFRAN-ODT) 4 MG disintegrating tablet Take 1 tablet (4 mg total) by mouth every 8 (eight) hours as needed for nausea or vomiting.   No facility-administered encounter medications on file as of 01/29/2020.     Review of Systems  Constitutional: Negative for chills and fever.  HENT: Negative for congestion, rhinorrhea and sore throat.   Respiratory: Negative for cough, shortness of breath and wheezing.   Cardiovascular: Negative for chest pain and leg swelling.  Gastrointestinal: Positive for diarrhea, nausea and vomiting. Negative for abdominal pain.  Genitourinary: Negative for dysuria and frequency.  Musculoskeletal: Negative for arthralgias and back pain.  Skin: Negative for rash.  Neurological: Negative for dizziness, weakness and headaches.  Psychiatric/Behavioral: Behavioral problem: [id number].     Vitals There were no vitals taken for this visit.  Objective:   Physical Exam  No PE due to phone visit.  Assessment and Plan   1. Gastroenteritis - ondansetron (ZOFRAN-ODT) 4 MG disintegrating tablet; Take 1 tablet (4 mg total) by mouth every 8 (eight) hours as needed for nausea or vomiting.  Dispense: 10 tablet; Refill: 0   Take zofran and immodium or pepto bismol.  incresase fluids and call if worsening pain, fever, or persistent vomiting and diarrhea.  Pt wanting note to be out on this Sunday.  Pt f/u with urology for renal  stone.  F/u prn.  Follow Up Instructions:     I discussed the assessment and treatment plan with the patient. The patient was provided an opportunity to ask questions and all were answered. The patient agreed with the plan and demonstrated an understanding of the instructions.   The patient was  advised to call back or seek an in-person evaluation if the symptoms worsen or if the condition fails to improve as anticipated.  I provided 12 minutes of non-face-to-face time during this encounter.

## 2020-01-30 ENCOUNTER — Ambulatory Visit: Payer: Self-pay

## 2020-01-30 NOTE — Telephone Encounter (Signed)
Patient says she has a kidney stone and she's having problems urinating. She says she's in pain to the right side, her groin and pressure when she tries to urinate. She says it started last night trying to urinate and it trickles out. She says she is in severe pain. She denies blood in the urine. Says she had a low grade fever last night 100.1 and today normal. I advised to go to the ED for evaluation, she verbalized understanding.  Reason for Disposition  [1] Unable to urinate (or only a few drops) > 4 hours AND [2] bladder feels very full (e.g., palpable bladder or strong urge to urinate)  Answer Assessment - Initial Assessment Questions 1. SEVERITY: "How bad is the pain?"  (e.g., Scale 1-10; mild, moderate, or severe)   - MILD (1-3): complains slightly about urination hurting   - MODERATE (4-7): interferes with normal activities     - SEVERE (8-10): excruciating, unwilling or unable to urinate because of the pain      Severe 2. FREQUENCY: "How many times have you had painful urination today?"      Started not coming out like it's supposed to 3. PATTERN: "Is pain present every time you urinate or just sometimes?"      Constant pain in flank area 4. ONSET: "When did the painful urination start?"      In the middle of the night 5. FEVER: "Do you have a fever?" If Yes, ask: "What is your temperature, how was it measured, and when did it start?"     100.1 last night 6. PAST UTI: "Have you had a urine infection before?" If Yes, ask: "When was the last time?" and "What happened that time?"      July kidney infection 7. CAUSE: "What do you think is causing the painful urination?"  (e.g., UTI, scratch, Herpes sore)     Kidney stone 8. OTHER SYMPTOMS: "Do you have any other symptoms?" (e.g., flank pain, vaginal discharge, genital sores, urgency, blood in urine)    Yes, flank pain, urgency 9. PREGNANCY: "Is there any chance you are pregnant?" "When was your last menstrual period?"      No  Protocols used: [place]

## 2020-01-31 ENCOUNTER — Encounter (HOSPITAL_COMMUNITY): Payer: Self-pay

## 2020-01-31 ENCOUNTER — Emergency Department (HOSPITAL_COMMUNITY)
Admission: EM | Admit: 2020-01-31 | Discharge: 2020-01-31 | Disposition: A | Discharge status: Home / Self Care | Payer: Medicaid Other | Attending: Emergency Medicine | Admitting: Emergency Medicine

## 2020-01-31 ENCOUNTER — Emergency Department (HOSPITAL_COMMUNITY): Payer: Medicaid Other

## 2020-01-31 ENCOUNTER — Other Ambulatory Visit: Payer: Self-pay

## 2020-01-31 DIAGNOSIS — J45909 Unspecified asthma, uncomplicated: Secondary | ICD-10-CM | POA: Insufficient documentation

## 2020-01-31 DIAGNOSIS — Z79899 Other long term (current) drug therapy: Secondary | ICD-10-CM | POA: Insufficient documentation

## 2020-01-31 DIAGNOSIS — N132 Hydronephrosis with renal and ureteral calculous obstruction: Secondary | ICD-10-CM | POA: Diagnosis not present

## 2020-01-31 DIAGNOSIS — N200 Calculus of kidney: Secondary | ICD-10-CM | POA: Diagnosis not present

## 2020-01-31 DIAGNOSIS — K820 Obstruction of gallbladder: Secondary | ICD-10-CM | POA: Diagnosis not present

## 2020-01-31 DIAGNOSIS — R109 Unspecified abdominal pain: Secondary | ICD-10-CM | POA: Diagnosis present

## 2020-01-31 DIAGNOSIS — Z7951 Long term (current) use of inhaled steroids: Secondary | ICD-10-CM | POA: Diagnosis not present

## 2020-01-31 LAB — BASIC METABOLIC PANEL
Anion gap: 9 (ref 5–15)
BUN: 12 mg/dL (ref 6–20)
CO2: 24 mmol/L (ref 22–32)
Calcium: 8.5 mg/dL — ABNORMAL LOW (ref 8.9–10.3)
Chloride: 104 mmol/L (ref 98–111)
Creatinine, Ser: 0.66 mg/dL (ref 0.44–1.00)
GFR calc Af Amer: >60 mL/min (ref >60)
GFR calc non Af Amer: >60 mL/min (ref >60)
Glucose, Bld: 108 mg/dL — ABNORMAL HIGH (ref 70–99)
Potassium: 3.7 mmol/L (ref 3.5–5.1)
Sodium: 137 mmol/L (ref 135–145)

## 2020-01-31 LAB — CBC WITH DIFFERENTIAL/PLATELET
Abs Immature Granulocytes: 0.04 10*3/uL (ref 0.00–0.07)
Basophils Absolute: 0.1 10*3/uL (ref 0.0–0.1)
Basophils Relative: 1 %
Eosinophils Absolute: 0.7 10*3/uL — ABNORMAL HIGH (ref 0.0–0.5)
Eosinophils Relative: 5 %
HCT: 33.8 % — ABNORMAL LOW (ref 36.0–46.0)
Hemoglobin: 10.3 g/dL — ABNORMAL LOW (ref 12.0–15.0)
Immature Granulocytes: 0 %
Lymphocytes Relative: 32 %
Lymphs Abs: 4.4 10*3/uL — ABNORMAL HIGH (ref 0.7–4.0)
MCH: 21 pg — ABNORMAL LOW (ref 26.0–34.0)
MCHC: 30.5 g/dL (ref 30.0–36.0)
MCV: 68.8 fL — ABNORMAL LOW (ref 80.0–100.0)
Monocytes Absolute: 0.8 10*3/uL (ref 0.1–1.0)
Monocytes Relative: 6 %
Neutro Abs: 7.7 10*3/uL (ref 1.7–7.7)
Neutrophils Relative %: 56 %
Platelets: 426 10*3/uL — ABNORMAL HIGH (ref 150–400)
RBC: 4.91 MIL/uL (ref 3.87–5.11)
RDW: 17.9 % — ABNORMAL HIGH (ref 11.5–15.5)
WBC: 13.7 10*3/uL — ABNORMAL HIGH (ref 4.0–10.5)
nRBC: 0 % (ref 0.0–0.2)

## 2020-01-31 LAB — URINALYSIS, ROUTINE W REFLEX MICROSCOPIC
Bilirubin Urine: NEGATIVE
Glucose, UA: NEGATIVE mg/dL
Ketones, ur: NEGATIVE mg/dL
Nitrite: NEGATIVE
Protein, ur: 30 mg/dL — AB
Specific Gravity, Urine: 1.021 (ref 1.005–1.030)
WBC, UA: >50 WBC/hpf — ABNORMAL HIGH (ref 0–5)
pH: 5 (ref 5.0–8.0)

## 2020-01-31 LAB — PREGNANCY, URINE: Preg Test, Ur: NEGATIVE

## 2020-01-31 MED ORDER — OXYCODONE-ACETAMINOPHEN 5-325 MG PO TABS: 1.0000 | ORAL_TABLET | Freq: Three times a day (TID) | ORAL | 0 refills | Status: DC | PRN

## 2020-01-31 MED ORDER — ONDANSETRON HCL 4 MG/2ML IJ SOLN
4.0000 mg | Freq: Once | INTRAMUSCULAR | Status: AC
  Administered 2020-01-31: 4 mg via INTRAVENOUS
  Filled 2020-01-31: qty 2

## 2020-01-31 MED ORDER — KETOROLAC TROMETHAMINE 30 MG/ML IJ SOLN
30.0000 mg | Freq: Once | INTRAMUSCULAR | Status: AC
  Administered 2020-01-31: 30 mg via INTRAVENOUS
  Filled 2020-01-31: qty 1

## 2020-01-31 MED ORDER — SODIUM CHLORIDE 0.9 % IV BOLUS
1000.0000 mL | Freq: Once | INTRAVENOUS | Status: AC
  Administered 2020-01-31: 1000 mL via INTRAVENOUS

## 2020-01-31 MED ORDER — SODIUM CHLORIDE 0.9 % IV SOLN
1.0000 g | Freq: Once | INTRAVENOUS | Status: AC
  Administered 2020-01-31: 1 g via INTRAVENOUS
  Filled 2020-01-31: qty 10

## 2020-01-31 NOTE — ED Provider Notes (Signed)
[facility] EMERGENCY DEPARTMENT Provider Note   CSN: [id number] Arrival date & time: 01/31/20  0058     History Chief Complaint  Patient presents with  . Dysuria    Stephanie Torres is a 24 y.o. female.  She feels like she is not able to empty her bladder completely.Patient known to have 1.2 cm proximal right-sided kidney stone that was diagnosed on August 2.  She is here with progressively worsening pain to her right flank that radiates to her right abdomen as well as difficulty with urination, frequency and urgency.  States like she feels like she is "peeing through a small hole in the balloon".  She says there is no pain with urination there is no blood in the urine.  Temperature 99 at home.  Nausea but no vomiting.  No vaginal bleeding or discharge.  She saw urology and was scheduled to have a laser lithotripsy but this was rescheduled until later in September. She is taking Tylenol and ibuprofen at home for her pain without relief.  The pain is in her right flank and radiates to her right lower abdomen.  There is no vaginal bleeding or discharge.  No previous abdominal surgeries.  The history is provided by the patient.  Dysuria Associated symptoms: abdominal pain and nausea   Associated symptoms: no fever and no vomiting        Past Medical History:  Diagnosis Date  . Anxiety   . Asthma   . Depression   . DUB (dysfunctional uterine bleeding)   . Heartburn   . PCOS (polycystic ovarian syndrome)     Patient Active Problem List   Diagnosis Date Noted  . Suicidal overdose, sequela ([place]) 04/27/2017  . Anxiety and depression 04/27/2017  . Depression 04/18/2016  . Ingrown toenail 04/12/2013  . PCOS (polycystic ovarian syndrome) 04/11/2013  . Hyperinsulinemia 11/21/2012  . Iron deficiency anemia 11/21/2012  . Other and unspecified hyperlipidemia 11/21/2012  . Gastritis 10/31/2012  . Migraine headache without aura 10/31/2012  . Asthma with acute exacerbation 10/31/2012    . Morbid obesity ([place]) 10/31/2012    Past Surgical History:  Procedure Laterality Date  . CESAREAN SECTION    . TONSILLECTOMY       OB History    Gravida  1   Para  1   Term      Preterm      AB      Living        SAB      TAB      Ectopic      Multiple      Live Births              Family History  Problem Relation Age of Onset  . Diabetes Mother   . Hypertension Mother     Social History   Tobacco Use  . Smoking status: Never Smoker  . Smokeless tobacco: Never Used  Vaping Use  . Vaping Use: Never used  Substance Use Topics  . Alcohol use: No  . Drug use: No    Home Medications Prior to Admission medications   Medication Sig Start Date End Date Taking? Authorizing Provider  albuterol (PROVENTIL) (2.5 MG/3ML) 0.083% nebulizer solution Take 3 mLs (2.5 mg total) by nebulization every 6 (six) hours as needed for wheezing or shortness of breath. 04/18/19   Mikey Kirschner, MD  albuterol (VENTOLIN HFA) 108 (90 Base) MCG/ACT inhaler Inhale 2 puffs into the lungs every 6 (six) hours as  needed for wheezing or shortness of breath. 07/22/19   Mikey Kirschner, MD  budesonide-formoterol (SYMBICORT) 80-4.5 MCG/ACT inhaler Inhale 2 puffs into the lungs 2 (two) times daily. 05/07/19   Mikey Kirschner, MD  ibuprofen (ADVIL) 600 MG tablet Take 1 tablet (600 mg total) by mouth every 6 (six) hours as needed. 01/14/20   Kathyrn Drown, MD  medroxyPROGESTERone (DEPO-PROVERA) 150 MG/ML injection Inject 1 mL (150 mg total) into the muscle every 3 (three) months. 12/04/19   Nilda Simmer, NP  ondansetron (ZOFRAN-ODT) 4 MG disintegrating tablet Take 1 tablet (4 mg total) by mouth every 8 (eight) hours as needed for nausea or vomiting. 01/29/20   Elvia Collum M, DO    Allergies    Zoloft [sertraline hcl]  Review of Systems   Review of Systems  Constitutional: Positive for chills. Negative for activity change, appetite change and fever.  HENT: Negative for  congestion and rhinorrhea.   Respiratory: Negative for cough, chest tightness and shortness of breath.   Cardiovascular: Negative for chest pain.  Gastrointestinal: Positive for abdominal pain and nausea. Negative for vomiting.  Genitourinary: Positive for dysuria. Negative for hematuria.  Musculoskeletal: Positive for back pain.  Skin: Negative for rash.  Neurological: Negative for dizziness, weakness and headaches.   all other systems are negative except as noted in the HPI and PMH.    Physical Exam Updated Vital Signs BP 137/82 (BP Location: Right Arm)   Pulse (!) 119   Temp 98.7 F (37.1 C) (Oral)   Resp (!) 21   Ht 5\' 4"  (1.626 m)   Wt 121.6 kg   SpO2 100%   BMI 46.00 kg/m   Physical Exam Vitals and nursing note reviewed.  Constitutional:      General: She is not in acute distress.    Appearance: She is well-developed. She is obese.     Comments: Resting comfortable  HENT:     Head: Normocephalic and atraumatic.     Mouth/Throat:     Pharynx: No oropharyngeal exudate.  Eyes:     Conjunctiva/sclera: Conjunctivae normal.     Pupils: Pupils are equal, round, and reactive to light.  Neck:     Comments: No meningismus. Cardiovascular:     Rate and Rhythm: Normal rate and regular rhythm.     Heart sounds: Normal heart sounds. No murmur heard.   Pulmonary:     Effort: Pulmonary effort is normal. No respiratory distress.     Breath sounds: Normal breath sounds.  Abdominal:     Palpations: Abdomen is soft.     Tenderness: There is abdominal tenderness. There is no guarding or rebound.     Comments: Right upper and lower quadrant tenderness without guarding or rebound  Musculoskeletal:        General: Tenderness present. Normal range of motion.     Cervical back: Normal range of motion and neck supple.     Comments: Right paraspinal lumbar tenderness  Skin:    General: Skin is warm.  Neurological:     Mental Status: She is alert and oriented to person, place, and  time.     Cranial Nerves: No cranial nerve deficit.     Motor: No abnormal muscle tone.     Coordination: Coordination normal.     Comments:  5/5 strength throughout. CN 2-12 intact.Equal grip strength.   Psychiatric:        Behavior: Behavior normal.     ED Results / Procedures / Treatments  Labs (all labs ordered are listed, but only abnormal results are displayed) Labs Reviewed  URINALYSIS, ROUTINE W REFLEX MICROSCOPIC - Abnormal; Notable for the following components:      Result Value   Hgb urine dipstick SMALL (*)    Protein, ur 30 (*)    Leukocytes,Ua MODERATE (*)    WBC, UA >50 (*)    Bacteria, UA RARE (*)    All other components within normal limits  CBC WITH DIFFERENTIAL/PLATELET - Abnormal; Notable for the following components:   WBC 13.7 (*)    Hemoglobin 10.3 (*)    HCT 33.8 (*)    MCV 68.8 (*)    MCH 21.0 (*)    RDW 17.9 (*)    Platelets 426 (*)    Lymphs Abs 4.4 (*)    Eosinophils Absolute 0.7 (*)    All other components within normal limits  BASIC METABOLIC PANEL - Abnormal; Notable for the following components:   Glucose, Bld 108 (*)    Calcium 8.5 (*)    All other components within normal limits  URINE CULTURE  PREGNANCY, URINE    EKG None  Radiology CT Renal Stone Study  Result Date: 01/31/2020 CLINICAL DATA:  Initial evaluation for acute flank pain, stone disease suspected. EXAM: CT ABDOMEN AND PELVIS WITHOUT CONTRAST TECHNIQUE: Multidetector CT imaging of the abdomen and pelvis was performed following the standard protocol without IV contrast. COMPARISON:  Prior CT from 01/05/2020. FINDINGS: Lower chest: Minimal subsegmental atelectatic changes seen within the left lung base. Visualized lungs are otherwise clear. Hepatobiliary: Limited noncontrast evaluation of the liver is unremarkable. Gallbladder contracted without acute abnormality. No biliary dilatation. Pancreas: Pancreas within normal limits. Spleen: Spleen within normal limits.  Adrenals/Urinary Tract: Adrenal glands are normal. Left kidney unremarkable without nephrolithiasis or hydronephrosis. On the right, there is an 12 mm obstructive stone positioned at the right renal pelvis with secondary moderate right hydronephrosis. Associated mild perinephric and periureteral fat stranding. No other radiopaque calculi seen within the right kidney. Right ureter decompressed distally with no other additional stones identified. Partially distended bladder within normal limits. No layering stones within the bladder lumen. Stomach/Bowel: Stomach within normal limits. No evidence for bowel obstruction. No acute inflammatory changes seen about the bowels. Appendix within normal limits. Vascular/Lymphatic: Intra-abdominal aorta of normal caliber. No adenopathy. Reproductive: Uterus and ovaries within normal limits. Other: No free air or fluid. Musculoskeletal: No acute osseous abnormality. No discrete or worrisome osseous lesions. IMPRESSION: 1. 12 mm obstructive stone at the right renal pelvis with secondary moderate right hydronephrosis. Overall, appearance is relatively unchanged as compared to 01/05/2020. 2. No other acute intra-abdominal or pelvic process. Electronically Signed   By: Jeannine Boga M.D.   On: 01/31/2020 07:44    Procedures Procedures (including critical care time)  Medications Ordered in ED Medications  sodium chloride 0.9 % bolus 1,000 mL (0 mLs Intravenous Stopped 01/31/20 0619)  ondansetron (ZOFRAN) injection 4 mg (4 mg Intravenous Given 01/31/20 0330)  ketorolac (TORADOL) 30 MG/ML injection 30 mg (30 mg Intravenous Given 01/31/20 0330)    ED Course  I have reviewed the triage vital signs and the nursing notes.  Pertinent labs & imaging results that were available during my care of the patient were reviewed by me and considered in my medical decision making (see chart for details).    MDM Rules/Calculators/A&P                         Patient  with known  kidney stone here with worsening pain to her right flank associated with nausea and difficulty with urination.  UA today shows moderate leukocyte esterase with greater than 50 WBCs. Culture sent. WBC 13 which appears to be chronic. Cr normal.  Bladder scan 157 mL. With pyruria and leukocytosis and tachycardia, concern for possible infected kidney stone. Patient afebrile and appears nontoxic however. HR improved to 74. D/w Dr. Jeffie Pollock of urology. He requests call back after CT scan.  CT scan pending at shift change. Dr. Alvino Chapel to assume care.  Anticipate d/w urology regarding possible intervention given size of stone and pyuria. Urine culture sent. Final Clinical Impression(s) / ED Diagnoses Final diagnoses:  None    Rx / DC Orders ED Discharge Orders    None       Addasyn Mcbreen, Annie Main, MD 01/31/20 [phone]

## 2020-01-31 NOTE — ED Triage Notes (Signed)
Pt reports recent history of kidney stone that she is going to have removed soon.  Pt states she continues to have pain and difficulty passing her urine at times.

## 2020-02-01 ENCOUNTER — Encounter: Payer: Self-pay | Admitting: Family Medicine

## 2020-02-01 LAB — URINE CULTURE

## 2020-02-02 ENCOUNTER — Encounter: Payer: Self-pay | Admitting: Family Medicine

## 2020-02-03 ENCOUNTER — Telehealth: Payer: Self-pay | Admitting: *Deleted

## 2020-02-03 ENCOUNTER — Ambulatory Visit: Payer: Medicaid Other | Admitting: Nurse Practitioner

## 2020-02-03 ENCOUNTER — Encounter: Payer: Self-pay | Admitting: Nurse Practitioner

## 2020-02-03 ENCOUNTER — Other Ambulatory Visit: Payer: Self-pay

## 2020-02-03 ENCOUNTER — Telehealth: Payer: Self-pay | Admitting: Urology

## 2020-02-03 VITALS — BP 122/78 | HR 123 | Temp 96.7°F | Wt 270.0 lb

## 2020-02-03 DIAGNOSIS — F329 Major depressive disorder, single episode, unspecified: Secondary | ICD-10-CM

## 2020-02-03 DIAGNOSIS — F419 Anxiety disorder, unspecified: Secondary | ICD-10-CM

## 2020-02-03 MED ORDER — CITALOPRAM HYDROBROMIDE 20 MG PO TABS: 20.0000 mg | ORAL_TABLET | Freq: Every day | ORAL | 2 refills | Status: DC

## 2020-02-03 MED ORDER — CLONAZEPAM 0.5 MG PO TABS: ORAL_TABLET | ORAL | 0 refills | Status: DC

## 2020-02-03 NOTE — Telephone Encounter (Signed)
Contacted patient to complete Transition of Care Assessment: Transition Care Management Follow-up Telephone Call  Date of discharge and from where:  [facility], 01/31/20  How have you been since you were released from the hospital? "I feell like complete shit"  Any questions or concerns? Yes, having blood in urine  Items Reviewed:  Did the pt receive and understand the discharge instructions provided? Yes   Medications obtained and verified? Yes   Any new allergies since your discharge? No   Dietary orders reviewed? Yes  Do you have support at home? Yes , family  Functional Questionnaire: (I = Independent and D = Dependent) ADLs:  I  Bathing/Dressing- I  Meal Prep- I  Eating- I  Maintaining continence- I  Transferring/Ambulation- I  Managing Meds- I  Follow up appointments reviewed:   PCP Hospital f/u appt confirmed? Yes  Scheduled to see Pearson Forster on 8/31 @ 1400.  [facility] f/u appt confirmed? No Patient to contact Dr Nicolette Bang, Urology, for follow up appt. Patient given office number.  Are transportation arrangements needed? No   If their condition worsens, is the pt aware to call PCP or go to the Emergency Dept.? YES  Was the patient provided with contact information for the PCP's office or ED? YES Was to pt encouraged to call back with questions or concerns? YES  Lenor Coffin, RN, BSN, [place] [facility] [place] [phone]

## 2020-02-03 NOTE — Progress Notes (Signed)
Subjective:    Patient ID: Stephanie Torres, female    DOB: 1996/05/13, 24 y.o.   MRN: [id number]  HPI Pt here today for anxiety and depression. Pt has tried Zoloft and Prozac. Zoloft gave pt suicidal thoughts. Pt has mixed feelings about Prozac.  Pt is suppose to have surgery to remove a kidney stone on 02/26/20. Surgery has been moved out due to [place] but pt is needing it sooner. Her mother is present with her today.  Patient has been experiencing some anxiety and depression but much worse lately due to a 12 mm kidney stone.  Patient was scheduled for procedure but this was delayed due to increased Covid cases.  Pain is extreme at times, has difficulty sleeping at nighttime.  Does not take the oxycodone, does not like how it makes her feel.  Mainly using ibuprofen for pain.  Patient is working full-time going to school.  Her mother has been helping her with childcare.  Currently on Depo-Provera for birth control.  Denies suicidal or homicidal thoughts or ideation.  Patient did have a one-time suicidal gesture with overdose only while she was taking her Zoloft.  Patient would like to try the medication she took several years ago since that seemed to work best.  A review of the chart indicated she took Celexa 20 mg. Depression screen [facility] 2/9 02/03/2020 01/01/2020 02/28/2019 01/31/2019 12/14/2017  Decreased Interest 2 2 1 1  0  Down, Depressed, Hopeless 1 2 1 1  0  PHQ - 2 Score 3 4 2 2  0  Altered sleeping 3 2 3 3 3   Tired, decreased energy 3 2 2 3 1   Change in appetite 3 2 2 3 1   Feeling bad or failure about yourself  2 2 0 1 0  Trouble concentrating 3 2 2 2 1   Moving slowly or fidgety/restless 3 2 0 3 1  Suicidal thoughts 0 1 0 0 0  PHQ-9 Score 20 17 11 17 7   Difficult doing work/chores Extremely dIfficult Very difficult Very difficult - Somewhat difficult   GAD 7 : Generalized Anxiety Score 02/03/2020 02/28/2019 01/31/2019 12/14/2017  Nervous, Anxious, on Edge 3 1 1 3   Control/stop worrying 3 3 1 3     Worry too much - different things 3 3 1 3   Trouble relaxing 3 1 2 3   Restless 1 0 3 3  Easily annoyed or irritable 3 3 3 3   Afraid - awful might happen 3 1 1 3   Total GAD 7 Score 19 12 12 21   Anxiety Difficulty Extremely difficult Very difficult Extremely difficult Somewhat difficult       Review of Systems     Objective:   Physical Exam NAD.  Alert, oriented.  Mildly anxious, fatigued affect with slight tremors.  Making good eye contact.  Dressed appropriately.  Thoughts logical coherent and relevant.  Speech clear.  Lungs clear.  Heart regular rate rhythm.       Assessment & Plan:   Problem List Items Addressed This Visit      Other   Anxiety and depression - Primary   Relevant Medications   citalopram (CELEXA) 20 MG tablet     Meds ordered this encounter  Medications   citalopram (CELEXA) 20 MG tablet    Sig: Take 1 tablet (20 mg total) by mouth daily.    Dispense:  30 tablet    Refill:  2    Has taken this without difficulty in the past    Order Specific Question:  Supervising Provider    Answer:   Sallee Lange A [[id number]]   clonazePAM (KLONOPIN) 0.5 MG tablet    Sig: Take 1/2-1 tab po BID Prn severe anxiety. Do not take with pain medications.    Dispense:  20 tablet    Refill:  0    Order Specific Question:   Supervising Provider    Answer:   Sallee Lange A [[id number]]   Restart Celexa 20 mg as directed.  Reviewed warning signs of potential adverse effects with patient and her mother.  DC medication and contact office if any problems. Trial of low-dose Klonopin use sparingly only for severe anxiety.  Our goal is to reduce use of this over time once her procedure is completed and the Celexa takes effect.  Do not take with any pain medications, patient is not currently taking any oxycodone.  Seek help immediately for any suicidal thoughts or ideation. Return in about 1 month (around 03/04/2020) for recheck; virtual is fine. 30 minutes was spent with the patient  including previsit chart review, time spent with patient, discussion of health issues, review of data including medical record, and documentation of the visit.

## 2020-02-03 NOTE — Telephone Encounter (Signed)
Patient called and states we moved appointment out further but she is having increased pain and missing work. She asked for a nurse to call her back.

## 2020-02-04 ENCOUNTER — Encounter: Payer: Self-pay | Admitting: Nurse Practitioner

## 2020-02-04 NOTE — Telephone Encounter (Signed)
Pt surgery scheduled for 9/23. Pt aware no OR time is available prior to that date.  Pt currently taking ibuprofen with little relief. Pt does not wish to take any narcotics due to taking anti depressants. Pt asking for something stronger that ibuprofen until her surgery date.

## 2020-02-17 ENCOUNTER — Telehealth (INDEPENDENT_AMBULATORY_CARE_PROVIDER_SITE_OTHER): Payer: Medicaid Other | Admitting: Family Medicine

## 2020-02-17 ENCOUNTER — Other Ambulatory Visit (HOSPITAL_COMMUNITY): Payer: Medicaid Other

## 2020-02-17 ENCOUNTER — Encounter: Payer: Self-pay | Admitting: Family Medicine

## 2020-02-17 ENCOUNTER — Encounter (HOSPITAL_COMMUNITY): Payer: Medicaid Other

## 2020-02-17 ENCOUNTER — Other Ambulatory Visit: Payer: Self-pay

## 2020-02-17 DIAGNOSIS — R197 Diarrhea, unspecified: Secondary | ICD-10-CM | POA: Diagnosis not present

## 2020-02-17 DIAGNOSIS — K921 Melena: Secondary | ICD-10-CM

## 2020-02-17 NOTE — Progress Notes (Signed)
Patient ID: Stephanie Torres, female    DOB: [birth year], 24 y.o.   MRN: [id number]  Virtual Visit via phone Note  I connected with Stephanie Torres on 02/17/20 at  4:10 PM EDT by a video enabled telemedicine application and verified that I am speaking with the correct person using two identifiers.  Location: Patient: home Provider: office   I discussed the limitations of evaluation and management by telemedicine and the availability of in person appointments. The patient expressed understanding and agreed to proceed.   Chief Complaint  Patient presents with  . kidney stone pain    Patient states she is having alot of pain from her kidney stones and it is causing nausea and diarrhea and she is unable to work- has surgery scheduled with urology next week and states she needs work note to cover her till her surgery   Subjective:    HPI Pt having diarrhea and starting to bleed.  Stream of blood in toilet.  Yesterday and today.  Didn't feel good last night. Blood in urine from the kidney stone also.   Pt has renal stone in kidney, needing to be removed.  Seeing urology.  May need note to be out till surgery. Keep having to miss work due 9/23 is surgery. Hasn't had solid bowel movement in a few weeks. At work 3-4 at work.  Today- had 2 times, blood today mixed with stool. Yesterday- 3 times with no blood yesterday. No vomiting. Has nausea and stomach cramping and hurting. Tried some tums last night.  Seen on 01/29/20- televisit for the same with me. Gave zofran and imodium at that time.  Medical History Stephanie Torres has a past medical history of Anxiety, Asthma, Depression, DUB (dysfunctional uterine bleeding), Heartburn, and PCOS (polycystic ovarian syndrome).   Outpatient Encounter Medications as of 02/17/2020  Medication Sig  . albuterol (PROVENTIL) (2.5 MG/3ML) 0.083% nebulizer solution Take 3 mLs (2.5 mg total) by nebulization every 6 (six) hours as needed for wheezing or  shortness of breath.  Marland Kitchen albuterol (VENTOLIN HFA) 108 (90 Base) MCG/ACT inhaler Inhale 2 puffs into the lungs every 6 (six) hours as needed for wheezing or shortness of breath.  . budesonide-formoterol (SYMBICORT) 80-4.5 MCG/ACT inhaler Inhale 2 puffs into the lungs 2 (two) times daily. (Patient taking differently: Inhale 2 puffs into the lungs 2 (two) times daily as needed (respiratory issues (typically during Winter months)). )  . citalopram (CELEXA) 20 MG tablet Take 1 tablet (20 mg total) by mouth daily.  . clonazePAM (KLONOPIN) 0.5 MG tablet Take 1/2-1 tab po BID Prn severe anxiety. Do not take with pain medications. (Patient taking differently: Take 0.25-0.5 mg by mouth 2 (two) times daily as needed for anxiety. Take 1/2-1 tab po BID Prn severe anxiety. Do not take with pain medications.)  . ibuprofen (ADVIL) 600 MG tablet Take 1 tablet (600 mg total) by mouth every 6 (six) hours as needed. (Patient taking differently: Take 600 mg by mouth every 6 (six) hours as needed (for pain.). )  . medroxyPROGESTERone (DEPO-PROVERA) 150 MG/ML injection Inject 1 mL (150 mg total) into the muscle every 3 (three) months.  . ondansetron (ZOFRAN-ODT) 4 MG disintegrating tablet Take 1 tablet (4 mg total) by mouth every 8 (eight) hours as needed for nausea or vomiting. (Patient not taking: Reported on 02/16/2020)   No facility-administered encounter medications on file as of 02/17/2020.     Review of Systems  Constitutional: Negative for chills and fever.  HENT: Negative for  congestion, rhinorrhea and sore throat.   Respiratory: Negative for cough, shortness of breath and wheezing.   Cardiovascular: Negative for chest pain and leg swelling.  Gastrointestinal: Positive for blood in stool, diarrhea and nausea. Negative for abdominal pain and vomiting.  Genitourinary: Negative for dysuria and frequency.  Musculoskeletal: Negative for arthralgias and back pain.  Skin: Negative for rash.  Neurological: Negative for  dizziness, weakness and headaches.     Vitals There were no vitals taken for this visit.  Objective:   Physical Exam   Assessment and Plan   1. Diarrhea, unspecified type  2. Blood in stool    Pt to take  immodium or pepto bismol.  Keep hydrated. Use otc hemorroid cream. call if worsening. Wanting note out of work till 03/04/20. To have surgery and post op.  Pt to call in to let us know if seeing blood again in am. We can put in labs.   F/u prn.  Follow Up Instructions:    I discussed the assessment and treatment plan with the patient. The patient was provided an opportunity to ask questions and all were answered. The patient agreed with the plan and demonstrated an understanding of the instructions.   The patient was advised to call back or seek an in-person evaluation if the symptoms worsen or if the condition fails to improve as anticipated.  I provided 12 minutes of non-face-to-face time during this encounter.

## 2020-02-18 ENCOUNTER — Encounter: Payer: Self-pay | Admitting: Family Medicine

## 2020-02-19 DIAGNOSIS — N200 Calculus of kidney: Secondary | ICD-10-CM

## 2020-02-19 NOTE — Patient Instructions (Signed)
Your procedure is scheduled on: 02/26/2020  Report to [facility] at     12:15 PM.  Call this number if you have problems the morning of surgery: [phone]   Remember:   Do not Eat or Drink after midnight         No Smoking the morning of surgery  :  Take these medicines the morning of surgery with A SIP OF WATER: Celexa and klonopin   Do not wear jewelry, make-up or nail polish.  Do not wear lotions, powders, or perfumes. You may wear deodorant.  Do not shave 48 hours prior to surgery. Men may shave face and neck.  Do not bring valuables to the hospital.  Contacts, dentures or bridgework may not be worn into surgery.  Leave suitcase in the car. After surgery it may be brought to your room.  For patients admitted to the hospital, checkout time is 11:00 AM the day of discharge.   Patients discharged the day of surgery will not be allowed to drive home.    Special Instructions: Shower using CHG night before surgery and shower the day of surgery use CHG.  Use special wash - you have one bottle of CHG for all showers.  You should use approximately 1/2 of the bottle for each shower.  How to Use Chlorhexidine for Bathing Chlorhexidine gluconate (CHG) is a germ-killing (antiseptic) solution that is used to clean the skin. It can get rid of the bacteria that normally live on the skin and can keep them away for about 24 hours. To clean your skin with CHG, you may be given:  A CHG solution to use in the shower or as part of a sponge bath.  A prepackaged cloth that contains CHG. Cleaning your skin with CHG may help lower the risk for infection:  While you are staying in the intensive care unit of the hospital.  If you have a vascular access, such as a central line, to provide short-term or long-term access to your veins.  If you have a catheter to drain urine from your bladder.  If you are on a ventilator. A ventilator is a machine that helps you breathe by moving air in and out of  your lungs.  After surgery. What are the risks? Risks of using CHG include:  A skin reaction.  Hearing loss, if CHG gets in your ears.  Eye injury, if CHG gets in your eyes and is not rinsed out.  The CHG product catching fire. Make sure that you avoid smoking and flames after applying CHG to your skin. Do not use CHG:  If you have a chlorhexidine allergy or have previously reacted to chlorhexidine.  On babies younger than 23 months of age. How to use CHG solution  Use CHG only as told by your health care provider, and follow the instructions on the label.  Use the full amount of CHG as directed. Usually, this is one bottle. During a shower Follow these steps when using CHG solution during a shower (unless your health care provider gives you different instructions): 1. Start the shower. 2. Use your normal soap and shampoo to wash your face and hair. 3. Turn off the shower or move out of the shower stream. 4. Pour the CHG onto a clean washcloth. Do not use any type of brush or rough-edged sponge. 5. Starting at your neck, lather your body down to your toes. Make sure you follow these instructions: ? If you will be having surgery,  pay special attention to the part of your body where you will be having surgery. Scrub this area for at least 1 minute. ? Do not use CHG on your head or face. If the solution gets into your ears or eyes, rinse them well with water. ? Avoid your genital area. ? Avoid any areas of skin that have broken skin, cuts, or scrapes. ? Scrub your back and under your arms. Make sure to wash skin folds. 6. Let the lather sit on your skin for 1-2 minutes or as long as told by your health care provider. 7. Thoroughly rinse your entire body in the shower. Make sure that all body creases and crevices are rinsed well. 8. Dry off with a clean towel. Do not put any substances on your body afterward--such as powder, lotion, or perfume--unless you are told to do so by your  health care provider. Only use lotions that are recommended by the manufacturer. 9. Put on clean clothes or pajamas. 10. If it is the night before your surgery, sleep in clean sheets.  During a sponge bath Follow these steps when using CHG solution during a sponge bath (unless your health care provider gives you different instructions): 1. Use your normal soap and shampoo to wash your face and hair. 2. Pour the CHG onto a clean washcloth. 3. Starting at your neck, lather your body down to your toes. Make sure you follow these instructions: ? If you will be having surgery, pay special attention to the part of your body where you will be having surgery. Scrub this area for at least 1 minute. ? Do not use CHG on your head or face. If the solution gets into your ears or eyes, rinse them well with water. ? Avoid your genital area. ? Avoid any areas of skin that have broken skin, cuts, or scrapes. ? Scrub your back and under your arms. Make sure to wash skin folds. 4. Let the lather sit on your skin for 1-2 minutes or as long as told by your health care provider. 5. Using a different clean, wet washcloth, thoroughly rinse your entire body. Make sure that all body creases and crevices are rinsed well. 6. Dry off with a clean towel. Do not put any substances on your body afterward--such as powder, lotion, or perfume--unless you are told to do so by your health care provider. Only use lotions that are recommended by the manufacturer. 7. Put on clean clothes or pajamas. 8. If it is the night before your surgery, sleep in clean sheets. How to use CHG prepackaged cloths  Only use CHG cloths as told by your health care provider, and follow the instructions on the label.  Use the CHG cloth on clean, dry skin.  Do not use the CHG cloth on your head or face unless your health care provider tells you to.  When washing with the CHG cloth: ? Avoid your genital area. ? Avoid any areas of skin that have  broken skin, cuts, or scrapes. Before surgery Follow these steps when using a CHG cloth to clean before surgery (unless your health care provider gives you different instructions): 1. Using the CHG cloth, vigorously scrub the part of your body where you will be having surgery. Scrub using a back-and-forth motion for 3 minutes. The area on your body should be completely wet with CHG when you are done scrubbing. 2. Do not rinse. Discard the cloth and let the area air-dry. Do not put any substances on  the area afterward, such as powder, lotion, or perfume. 3. Put on clean clothes or pajamas. 4. If it is the night before your surgery, sleep in clean sheets.  For general bathing Follow these steps when using CHG cloths for general bathing (unless your health care provider gives you different instructions). 1. Use a separate CHG cloth for each area of your body. Make sure you wash between any folds of skin and between your fingers and toes. Wash your body in the following order, switching to a new cloth after each step: ? The front of your neck, shoulders, and chest. ? Both of your arms, under your arms, and your hands. ? Your stomach and groin area, avoiding the genitals. ? Your right leg and foot. ? Your left leg and foot. ? The back of your neck, your back, and your buttocks. 2. Do not rinse. Discard the cloth and let the area air-dry. Do not put any substances on your body afterward--such as powder, lotion, or perfume--unless you are told to do so by your health care provider. Only use lotions that are recommended by the manufacturer. 3. Put on clean clothes or pajamas. Contact a health care provider if:  Your skin gets irritated after scrubbing.  You have questions about using your solution or cloth. Get help right away if:  Your eyes become very red or swollen.  Your eyes itch badly.  Your skin itches badly and is red or swollen.  Your hearing changes.  You have trouble  seeing.  You have swelling or tingling in your mouth or throat.  You have trouble breathing.  You swallow any chlorhexidine. Summary  Chlorhexidine gluconate (CHG) is a germ-killing (antiseptic) solution that is used to clean the skin. Cleaning your skin with CHG may help to lower your risk for infection.  You may be given CHG to use for bathing. It may be in a bottle or in a prepackaged cloth to use on your skin. Carefully follow your health care provider's instructions and the instructions on the product label.  Do not use CHG if you have a chlorhexidine allergy.  Contact your health care provider if your skin gets irritated after scrubbing. This information is not intended to replace advice given to you by your health care provider. Make sure you discuss any questions you have with your health care provider. Document Revised: 08/08/2018 Document Reviewed: 04/19/2017 Elsevier Patient Education  [place].  Ureteroscopy Ureteroscopy is a procedure to check for and treat problems inside part of the urinary tract. In this procedure, a thin, tube-shaped instrument with a light at the end (ureteroscope) is used to look at the inside of the kidneys and the ureters, which are the tubes that carry urine from the kidneys to the bladder. The ureteroscope is inserted into one or both of the ureters. You may need this procedure if you have frequent urinary tract infections (UTIs), blood in your urine, or a stone in one of your ureters. A ureteroscopy can be done to find the cause of urine blockage in a ureter and to evaluate other abnormalities inside the ureters or kidneys. If stones are found, they can be removed during the procedure. Polyps, abnormal tissue, and some types of tumors can also be removed or treated. The ureteroscope may also have a tool to remove tissue to be checked for disease under a microscope (biopsy). Tell a health care provider about:  Any allergies you have.  All  medicines you are taking, including vitamins,  herbs, eye drops, creams, and over-the-counter medicines.  Any problems you or family members have had with anesthetic medicines.  Any blood disorders you have.  Any surgeries you have had.  Any medical conditions you have.  Whether you are pregnant or may be pregnant. What are the risks? Generally, this is a safe procedure. However, problems may occur, including:  Bleeding.  Infection.  Allergic reactions to medicines.  Scarring that narrows the ureter (stricture).  Creating a hole in the ureter (perforation). What happens before the procedure?   Medicines  Ask your health care provider about: ? Changing or stopping your regular medicines. This is especially important if you are taking diabetes medicines or blood thinners. ? Taking medicines such as aspirin and ibuprofen. These medicines can thin your blood. Do not take these medicines before your procedure if your health care provider instructs you not to.  You may be given antibiotic medicine to help prevent infection. General instructions  You may have a urine sample taken to check for infection.  Plan to have someone take you home from the hospital or clinic. What happens during the procedure?   To reduce your risk of infection: ? Your health care team will wash or sanitize their hands. ? Your skin will be washed with soap.  An IV tube will be inserted into one of your veins.  You will be given one of the following: ? A medicine to help you relax (sedative). ? A medicine to make you fall asleep (general anesthetic). ? A medicine that is injected into your spine to numb the area below and slightly above the injection site (spinal anesthetic).  To lower your risk of infection, you may be given an antibiotic medicine by an injection or through the IV tube.  The opening from which you urinate (urethra) will be cleaned with a germ-killing solution.  The ureteroscope  will be passed through your urethra into your bladder.  A salt-water solution will flow through the ureteroscope to fill your bladder. This will help the health care provider see the openings of your ureters more clearly.  Then, the ureteroscope will be passed into your ureter. ? If a growth is found, a piece of it may be removed so it can be examined under a microscope (biopsy). ? If a stone is found, it may be removed through the ureteroscope, or the stone may be broken up using a laser, shock waves, or electrical energy. ? In some cases, if the ureter is too small, a tube may be inserted that keeps the ureter open (ureteral stent). The stent may be left in place for 1 or 2 weeks to keep the ureter open, and then the ureteroscopy procedure will be performed.  The scope will be removed, and your bladder will be emptied. The procedure may vary among health care providers and hospitals. What happens after the procedure?  Your blood pressure, heart rate, breathing rate, and blood oxygen level will be monitored until the medicines you were given have worn off.  You may be asked to urinate.  Donot drive for 24 hours if you were given a sedative. This information is not intended to replace advice given to you by your health care provider. Make sure you discuss any questions you have with your health care provider. Document Revised: 05/04/2017 Document Reviewed: 03/03/2016 Elsevier Patient Education  2020 [place] Anesthesia, Adult, Care After This sheet gives you information about how to care for yourself after your procedure. Your  health care provider may also give you more specific instructions. If you have problems or questions, contact your health care provider. What can I expect after the procedure? After the procedure, the following side effects are common:  Pain or discomfort at the IV site.  Nausea.  Vomiting.  Sore throat.  Trouble concentrating.  Feeling cold  or chills.  Weak or tired.  Sleepiness and fatigue.  Soreness and body aches. These side effects can affect parts of the body that were not involved in surgery. Follow these instructions at home:  For at least 24 hours after the procedure:  Have a responsible adult stay with you. It is important to have someone help care for you until you are awake and alert.  Rest as needed.  Do not: ? Participate in activities in which you could fall or become injured. ? Drive. ? Use heavy machinery. ? Drink alcohol. ? Take sleeping pills or medicines that cause drowsiness. ? Make important decisions or sign legal documents. ? Take care of children on your own. Eating and drinking  Follow any instructions from your health care provider about eating or drinking restrictions.  When you feel hungry, start by eating small amounts of foods that are soft and easy to digest (bland), such as toast. Gradually return to your regular diet.  Drink enough fluid to keep your urine pale yellow.  If you vomit, rehydrate by drinking water, juice, or clear broth. General instructions  If you have sleep apnea, surgery and certain medicines can increase your risk for breathing problems. Follow instructions from your health care provider about wearing your sleep device: ? Anytime you are sleeping, including during daytime naps. ? While taking prescription pain medicines, sleeping medicines, or medicines that make you drowsy.  Return to your normal activities as told by your health care provider. Ask your health care provider what activities are safe for you.  Take over-the-counter and prescription medicines only as told by your health care provider.  If you smoke, do not smoke without supervision.  Keep all follow-up visits as told by your health care provider. This is important. Contact a health care provider if:  You have nausea or vomiting that does not get better with medicine.  You cannot eat or  drink without vomiting.  You have pain that does not get better with medicine.  You are unable to pass urine.  You develop a skin rash.  You have a fever.  You have redness around your IV site that gets worse. Get help right away if:  You have difficulty breathing.  You have chest pain.  You have blood in your urine or stool, or you vomit blood. Summary  After the procedure, it is common to have a sore throat or nausea. It is also common to feel tired.  Have a responsible adult stay with you for the first 24 hours after general anesthesia. It is important to have someone help care for you until you are awake and alert.  When you feel hungry, start by eating small amounts of foods that are soft and easy to digest (bland), such as toast. Gradually return to your regular diet.  Drink enough fluid to keep your urine pale yellow.  Return to your normal activities as told by your health care provider. Ask your health care provider what activities are safe for you. This information is not intended to replace advice given to you by your health care provider. Make sure you discuss any questions you  have with your health care provider. Document Revised: 05/25/2017 Document Reviewed: 01/05/2017 Elsevier Patient Education  [place].

## 2020-02-24 ENCOUNTER — Other Ambulatory Visit (HOSPITAL_COMMUNITY)
Admission: RE | Admit: 2020-02-24 | Discharge: 2020-02-24 | Disposition: A | Discharge status: Home / Self Care | Payer: Medicaid Other | Source: Ambulatory Visit | Attending: Urology | Admitting: Urology

## 2020-02-24 ENCOUNTER — Other Ambulatory Visit: Payer: Self-pay

## 2020-02-24 ENCOUNTER — Encounter (HOSPITAL_COMMUNITY)
Admission: RE | Admit: 2020-02-24 | Discharge: 2020-02-24 | Disposition: A | Discharge status: Home / Self Care | Payer: Medicaid Other | Source: Ambulatory Visit | Attending: Urology | Admitting: Urology

## 2020-02-24 ENCOUNTER — Encounter (HOSPITAL_COMMUNITY): Payer: Self-pay

## 2020-02-24 DIAGNOSIS — Z20822 Contact with and (suspected) exposure to covid-19: Secondary | ICD-10-CM | POA: Diagnosis not present

## 2020-02-24 DIAGNOSIS — Z01812 Encounter for preprocedural laboratory examination: Secondary | ICD-10-CM | POA: Diagnosis not present

## 2020-02-24 LAB — PREGNANCY, URINE: Preg Test, Ur: NEGATIVE

## 2020-02-25 LAB — SARS CORONAVIRUS 2 (TAT 6-24 HRS): SARS Coronavirus 2: NEGATIVE

## 2020-02-26 ENCOUNTER — Ambulatory Visit (HOSPITAL_COMMUNITY): Payer: Medicaid Other | Admitting: Anesthesiology

## 2020-02-26 ENCOUNTER — Other Ambulatory Visit: Payer: Self-pay

## 2020-02-26 ENCOUNTER — Ambulatory Visit (HOSPITAL_COMMUNITY)
Admission: RE | Admit: 2020-02-26 | Discharge: 2020-02-26 | Disposition: A | Discharge status: Home / Self Care | Payer: Medicaid Other | Attending: Urology | Admitting: Urology

## 2020-02-26 ENCOUNTER — Ambulatory Visit (HOSPITAL_COMMUNITY): Payer: Medicaid Other

## 2020-02-26 ENCOUNTER — Encounter (HOSPITAL_COMMUNITY)
Admission: RE | Disposition: A | Discharge status: Home / Self Care | Payer: Self-pay | Source: Home / Self Care | Attending: Urology

## 2020-02-26 DIAGNOSIS — R519 Headache, unspecified: Secondary | ICD-10-CM | POA: Insufficient documentation

## 2020-02-26 DIAGNOSIS — J45909 Unspecified asthma, uncomplicated: Secondary | ICD-10-CM | POA: Diagnosis not present

## 2020-02-26 DIAGNOSIS — Z793 Long term (current) use of hormonal contraceptives: Secondary | ICD-10-CM | POA: Insufficient documentation

## 2020-02-26 DIAGNOSIS — D649 Anemia, unspecified: Secondary | ICD-10-CM | POA: Insufficient documentation

## 2020-02-26 DIAGNOSIS — F419 Anxiety disorder, unspecified: Secondary | ICD-10-CM | POA: Insufficient documentation

## 2020-02-26 DIAGNOSIS — K529 Noninfective gastroenteritis and colitis, unspecified: Secondary | ICD-10-CM

## 2020-02-26 DIAGNOSIS — N200 Calculus of kidney: Secondary | ICD-10-CM | POA: Diagnosis not present

## 2020-02-26 DIAGNOSIS — E282 Polycystic ovarian syndrome: Secondary | ICD-10-CM | POA: Diagnosis not present

## 2020-02-26 DIAGNOSIS — R0602 Shortness of breath: Secondary | ICD-10-CM | POA: Insufficient documentation

## 2020-02-26 DIAGNOSIS — F329 Major depressive disorder, single episode, unspecified: Secondary | ICD-10-CM | POA: Diagnosis not present

## 2020-02-26 DIAGNOSIS — Z7951 Long term (current) use of inhaled steroids: Secondary | ICD-10-CM | POA: Insufficient documentation

## 2020-02-26 DIAGNOSIS — Z6841 Body Mass Index (BMI) 40.0 and over, adult: Secondary | ICD-10-CM | POA: Diagnosis not present

## 2020-02-26 DIAGNOSIS — N132 Hydronephrosis with renal and ureteral calculous obstruction: Secondary | ICD-10-CM | POA: Diagnosis not present

## 2020-02-26 DIAGNOSIS — Z8249 Family history of ischemic heart disease and other diseases of the circulatory system: Secondary | ICD-10-CM | POA: Diagnosis not present

## 2020-02-26 DIAGNOSIS — Z888 Allergy status to other drugs, medicaments and biological substances status: Secondary | ICD-10-CM | POA: Insufficient documentation

## 2020-02-26 DIAGNOSIS — Z79899 Other long term (current) drug therapy: Secondary | ICD-10-CM | POA: Insufficient documentation

## 2020-02-26 DIAGNOSIS — Z833 Family history of diabetes mellitus: Secondary | ICD-10-CM | POA: Diagnosis not present

## 2020-02-26 DIAGNOSIS — R12 Heartburn: Secondary | ICD-10-CM | POA: Diagnosis not present

## 2020-02-26 DIAGNOSIS — F418 Other specified anxiety disorders: Secondary | ICD-10-CM | POA: Diagnosis not present

## 2020-02-26 HISTORY — PX: CYSTOSCOPY WITH RETROGRADE PYELOGRAM, URETEROSCOPY AND STENT PLACEMENT: SHX5789

## 2020-02-26 SURGERY — CYSTOURETEROSCOPY, WITH RETROGRADE PYELOGRAM AND STENT INSERTION
Anesthesia: General | Site: Urethra | Laterality: Right

## 2020-02-26 MED ORDER — ONDANSETRON HCL 4 MG/2ML IJ SOLN
INTRAMUSCULAR | Status: DC | PRN
  Administered 2020-02-26: 4 mg via INTRAVENOUS

## 2020-02-26 MED ORDER — OXYCODONE-ACETAMINOPHEN 5-325 MG PO TABS
1.0000 | ORAL_TABLET | ORAL | Status: AC | PRN
  Administered 2020-02-26: 1 via ORAL

## 2020-02-26 MED ORDER — CHLORHEXIDINE GLUCONATE 0.12 % MT SOLN: 15.0000 mL | Freq: Once | OROMUCOSAL | Status: DC

## 2020-02-26 MED ORDER — DEXAMETHASONE SODIUM PHOSPHATE 10 MG/ML IJ SOLN
INTRAMUSCULAR | Status: AC
  Filled 2020-02-26: qty 1

## 2020-02-26 MED ORDER — LACTATED RINGERS IV SOLN: Freq: Once | INTRAVENOUS | Status: AC

## 2020-02-26 MED ORDER — DIATRIZOATE MEGLUMINE 30 % UR SOLN
URETHRAL | Status: DC | PRN
  Administered 2020-02-26: 300 mL via URETHRAL

## 2020-02-26 MED ORDER — PROMETHAZINE HCL 25 MG/ML IJ SOLN: 6.2500 mg | INTRAMUSCULAR | Status: DC | PRN

## 2020-02-26 MED ORDER — CEFAZOLIN SODIUM-DEXTROSE 2-4 GM/100ML-% IV SOLN
2.0000 g | INTRAVENOUS | Status: AC
  Administered 2020-02-26: 2 g via INTRAVENOUS

## 2020-02-26 MED ORDER — DIATRIZOATE MEGLUMINE 30 % UR SOLN
URETHRAL | Status: AC
  Filled 2020-02-26: qty 100

## 2020-02-26 MED ORDER — FENTANYL CITRATE (PF) 100 MCG/2ML IJ SOLN
INTRAMUSCULAR | Status: AC
  Filled 2020-02-26: qty 2

## 2020-02-26 MED ORDER — MIDAZOLAM HCL 5 MG/5ML IJ SOLN
INTRAMUSCULAR | Status: DC | PRN
  Administered 2020-02-26: 2 mg via INTRAVENOUS

## 2020-02-26 MED ORDER — CEFAZOLIN SODIUM-DEXTROSE 2-4 GM/100ML-% IV SOLN
INTRAVENOUS | Status: AC
  Filled 2020-02-26: qty 100

## 2020-02-26 MED ORDER — OXYCODONE-ACETAMINOPHEN 5-325 MG PO TABS
ORAL_TABLET | ORAL | Status: AC
  Filled 2020-02-26: qty 1

## 2020-02-26 MED ORDER — FENTANYL CITRATE (PF) 100 MCG/2ML IJ SOLN
INTRAMUSCULAR | Status: DC | PRN
Start: 2020-02-26 — End: 2020-02-26
  Administered 2020-02-26: 100 ug via INTRAVENOUS

## 2020-02-26 MED ORDER — TAMSULOSIN HCL 0.4 MG PO CAPS: 0.4000 mg | ORAL_CAPSULE | Freq: Every day | ORAL | 1 refills | Status: DC

## 2020-02-26 MED ORDER — MIDAZOLAM HCL 2 MG/2ML IJ SOLN
INTRAMUSCULAR | Status: AC
  Filled 2020-02-26: qty 2

## 2020-02-26 MED ORDER — LACTATED RINGERS IV SOLN: INTRAVENOUS | Status: DC | PRN

## 2020-02-26 MED ORDER — PROPOFOL 10 MG/ML IV BOLUS
INTRAVENOUS | Status: DC | PRN
  Administered 2020-02-26: 300 mg via INTRAVENOUS

## 2020-02-26 MED ORDER — ONDANSETRON HCL 4 MG/2ML IJ SOLN
INTRAMUSCULAR | Status: AC
  Filled 2020-02-26: qty 2

## 2020-02-26 MED ORDER — DEXAMETHASONE SODIUM PHOSPHATE 10 MG/ML IJ SOLN
INTRAMUSCULAR | Status: DC | PRN
  Administered 2020-02-26: 10 mg via INTRAVENOUS

## 2020-02-26 MED ORDER — OXYCODONE-ACETAMINOPHEN 5-325 MG PO TABS: 1.0000 | ORAL_TABLET | ORAL | 0 refills | Status: DC | PRN

## 2020-02-26 MED ORDER — WATER FOR IRRIGATION, STERILE IR SOLN
Status: DC | PRN
  Administered 2020-02-26: 500 mL

## 2020-02-26 MED ORDER — ORAL CARE MOUTH RINSE: 15.0000 mL | Freq: Once | OROMUCOSAL | Status: DC

## 2020-02-26 MED ORDER — MEPERIDINE HCL 50 MG/ML IJ SOLN: 6.2500 mg | INTRAMUSCULAR | Status: DC | PRN

## 2020-02-26 MED ORDER — HYDROMORPHONE HCL 1 MG/ML IJ SOLN
0.2500 mg | INTRAMUSCULAR | Status: DC | PRN
  Administered 2020-02-26: 0.5 mg via INTRAVENOUS
  Filled 2020-02-26: qty 0.5

## 2020-02-26 MED ORDER — CEFUROXIME AXETIL 500 MG PO TABS: 500.0000 mg | ORAL_TABLET | Freq: Two times a day (BID) | ORAL | 0 refills | Status: DC

## 2020-02-26 MED ORDER — ONDANSETRON 4 MG PO TBDP: 4.0000 mg | ORAL_TABLET | Freq: Three times a day (TID) | ORAL | 0 refills | Status: DC | PRN

## 2020-02-26 SURGICAL SUPPLY — 27 items
BAG DRAIN URO TABLE W/ADPT NS (BAG) ×2 IMPLANT
BAG DRN 8 ADPR NS SKTRN CSTL (BAG) ×1
BAG HAMPER (MISCELLANEOUS) ×2 IMPLANT
CATH INTERMIT  6FR 70CM (CATHETERS) ×2 IMPLANT
CLOTH BEACON ORANGE TIMEOUT ST (SAFETY) ×2 IMPLANT
DECANTER SPIKE VIAL GLASS SM (MISCELLANEOUS) ×2 IMPLANT
EXTRACTOR STONE NITINOL NGAGE (UROLOGICAL SUPPLIES) IMPLANT
FIBER LASER FLEXIVA 200 (UROLOGICAL SUPPLIES) IMPLANT
GLOVE BIO SURGEON STRL SZ8 (GLOVE) ×2 IMPLANT
GLOVE BIOGEL M 7.0 STRL (GLOVE) ×2 IMPLANT
GLOVE BIOGEL M STRL SZ7.5 (GLOVE) ×2 IMPLANT
GLOVE BIOGEL PI IND STRL 7.0 (GLOVE) ×3 IMPLANT
GLOVE BIOGEL PI INDICATOR 7.0 (GLOVE) ×3
GOWN STRL REUS W/TWL LRG LVL3 (GOWN DISPOSABLE) ×2 IMPLANT
GOWN STRL REUS W/TWL XL LVL3 (GOWN DISPOSABLE) ×2 IMPLANT
GUIDEWIRE STR DUAL SENSOR (WIRE) ×2 IMPLANT
GUIDEWIRE STR ZIPWIRE 035X150 (MISCELLANEOUS) ×2 IMPLANT
IV NS IRRIG 3000ML ARTHROMATIC (IV SOLUTION) ×4 IMPLANT
KIT TURNOVER CYSTO (KITS) ×2 IMPLANT
MANIFOLD NEPTUNE II (INSTRUMENTS) ×2 IMPLANT
PACK CYSTO (CUSTOM PROCEDURE TRAY) ×2 IMPLANT
PAD ARMBOARD 7.5X6 YLW CONV (MISCELLANEOUS) ×2 IMPLANT
STENT URET 6FRX24 CONTOUR (STENTS) IMPLANT
STENT URET 6FRX26 CONTOUR (STENTS) ×2 IMPLANT
SYR 10ML LL (SYRINGE) ×2 IMPLANT
TOWEL OR 17X26 4PK STRL BLUE (TOWEL DISPOSABLE) ×2 IMPLANT
WATER STERILE IRR 500ML POUR (IV SOLUTION) ×2 IMPLANT

## 2020-02-26 NOTE — H&P (View-Only) (Signed)
nephrolithiasis  HPI: Ms Yagi is a 24yo here for evaluation of nephrolithiasis. She was seen in the ER on 01/05/2020 with right flank pain and was diagnosed with a 1.2cm right UPJ calculus. She has been having intermittent right flank over the past year. She had a a CT stone study in 4/202 which showed the same stone and at that time was 85mm. This is her first stone event. No family hx of nephrolithiasis. Pain is mild currently . No LUTS.    PMH:     Past Medical History:  Diagnosis Date  . Anxiety   . Asthma   . Depression   . DUB (dysfunctional uterine bleeding)   . Heartburn   . PCOS (polycystic ovarian syndrome)     Surgical History:      Past Surgical History:  Procedure Laterality Date  . CESAREAN SECTION    . TONSILLECTOMY      Home Medications:       Allergies as of 01/12/2020      Reactions   Zoloft [sertraline Hcl] Other (See Comments)   Suicidal thoughts or ideation         Medication List       Accurate as of January 12, 2020  4:35 PM. If you have any questions, ask your nurse or doctor.        STOP taking these medications   ciprofloxacin 500 MG tablet Commonly known as: CIPRO Stopped by: Nicolette Bang, MD   FLUoxetine 20 MG capsule Commonly known as: PROZAC Stopped by: Nicolette Bang, MD     TAKE these medications   albuterol (2.5 MG/3ML) 0.083% nebulizer solution Commonly known as: PROVENTIL Take 3 mLs (2.5 mg total) by nebulization every 6 (six) hours as needed for wheezing or shortness of breath.   albuterol 108 (90 Base) MCG/ACT inhaler Commonly known as: VENTOLIN HFA Inhale 2 puffs into the lungs every 6 (six) hours as needed for wheezing or shortness of breath.   budesonide-formoterol 80-4.5 MCG/ACT inhaler Commonly known as: SYMBICORT Inhale 2 puffs into the lungs 2 (two) times daily.   medroxyPROGESTERone 150 MG/ML injection Commonly known as: DEPO-PROVERA Inject 1 mL (150 mg total) into the  muscle every 3 (three) months.       Allergies:  Allergies  Allergen Reactions  . Zoloft [Sertraline Hcl] Other (See Comments)    Suicidal thoughts or ideation    Family History:      Family History  Problem Relation Age of Onset  . Diabetes Mother   . Hypertension Mother     Social History:  reports that she has never smoked. She has never used smokeless tobacco. She reports that she does not drink alcohol and does not use drugs.  ROS: All other review of systems were reviewed and are negative except what is noted above in HPI  Physical Exam: BP 123/87   Pulse (!) 111   Temp 98.3 F (36.8 C)   Ht 5\' 3"  (1.6 m)   Wt 269 lb 3.2 oz (122.1 kg)   BMI 47.69 kg/m   Constitutional:  Alert and oriented, No acute distress. HEENT: [place] AT, moist mucus membranes.  Trachea midline, no masses. Cardiovascular: No clubbing, cyanosis, or edema. Respiratory: Normal respiratory effort, no increased work of breathing. GI: Abdomen is soft, nontender, nondistended, no abdominal masses GU: No CVA tenderness.  Lymph: No cervical or inguinal lymphadenopathy. Skin: No rashes, bruises or suspicious lesions. Neurologic: Grossly intact, no focal deficits, moving all 4 extremities. Psychiatric: Normal mood and  affect.  Laboratory Data: Recent Labs       Lab Results  Component Value Date   WBC 13.8 (H) 01/05/2020   HGB 11.1 (L) 01/05/2020   HCT 37.1 01/05/2020   MCV 68.2 (L) 01/05/2020   PLT 455 (H) 01/05/2020      Recent Labs       Lab Results  Component Value Date   CREATININE 0.79 01/05/2020      Recent Labs  No results found for: PSA    Recent Labs  No results found for: TESTOSTERONE    Recent Labs       Lab Results  Component Value Date   HGBA1C 5.6 01/01/2014      Urinalysis Labs (Brief)          Component Value Date/Time   COLORURINE YELLOW 01/05/2020 1113   APPEARANCEUR Cloudy (A) 01/12/2020 1621   LABSPEC 1.014  01/05/2020 1113   PHURINE 6.0 01/05/2020 1113   GLUCOSEU Negative 01/12/2020 1621   HGBUR SMALL (A) 01/05/2020 1113   BILIRUBINUR Negative 01/12/2020 1621   KETONESUR NEGATIVE 01/05/2020 1113   PROTEINUR 1+ (A) 01/12/2020 1621   PROTEINUR NEGATIVE 01/05/2020 1113   UROBILINOGEN 0.2 09/14/2013 2238   NITRITE Negative 01/12/2020 1621   NITRITE NEGATIVE 01/05/2020 1113   LEUKOCYTESUR 2+ (A) 01/12/2020 1621   LEUKOCYTESUR SMALL (A) 01/05/2020 1113      Recent Labs       Lab Results  Component Value Date   LABMICR See below: 01/12/2020   WBCUA >30 (A) 01/12/2020   LABEPIT 0-10 01/12/2020   BACTERIA Few (A) 01/12/2020      Pertinent Imaging: CT 01/05/2020: Images reviewed and discussed with the patient  No results found for this or any previous visit.  No results found for this or any previous visit.  No results found for this or any previous visit.  No results found for this or any previous visit.  No results found for this or any previous visit.  No results found for this or any previous visit.  No results found for this or any previous visit.  Results for orders placed during the hospital encounter of 01/05/20  CT Renal Stone Study  Narrative CLINICAL DATA:  Right flank pain x2 days.  EXAM: CT ABDOMEN AND PELVIS WITHOUT CONTRAST  TECHNIQUE: Multidetector CT imaging of the abdomen and pelvis was performed following the standard protocol without IV contrast.  COMPARISON:  October 02, 2018  FINDINGS: Lower chest: No acute abnormality.  Hepatobiliary: No focal liver abnormality is seen. No gallstones, gallbladder wall thickening, or biliary dilatation.  Pancreas: Unremarkable. No pancreatic ductal dilatation or surrounding inflammatory changes.  Spleen: Normal in size without focal abnormality.  Adrenals/Urinary Tract: Adrenal glands are unremarkable. Kidneys are normal in size, without focal lesions. A 1.2 cm renal  stone is seen within the distal aspect of the right renal pelvis. Mild to moderate severity associated right-sided hydronephrosis is seen. Bladder is unremarkable.  Stomach/Bowel: Stomach is within normal limits. Appendix appears normal. No evidence of bowel wall thickening, distention, or inflammatory changes.  Vascular/Lymphatic: No significant vascular findings are present. No enlarged abdominal or pelvic lymph nodes.  Reproductive: Uterus is positioned to the left of midline and is otherwise normal in appearance. The bilateral adnexa are unremarkable.  Other: No abdominal wall hernia or abnormality. No abdominopelvic ascites.  Musculoskeletal: No acute or significant osseous findings.  IMPRESSION: 1. 1.2 cm partially obstructing renal stone within the distal aspect of the right renal pelvis with  mild to moderate severity associated right-sided hydronephrosis.   Electronically Signed By: Virgina Norfolk M.D. On: 01/05/2020 15:26   Assessment & Plan:    1. Kidney stones -We discussed the management of kidney stones. These options include observation, ureteroscopy, shockwave lithotripsy (ESWL) and percutaneous nephrolithotomy (PCNL). We discussed which options are relevant to the patient's stone(s). We discussed the natural history of kidney stones as well as the complications of untreated stones and the impact on quality of life without treatment as well as with each of the above listed treatments. We also discussed the efficacy of each treatment in its ability to clear the stone burden. With any of these management options I discussed the signs and symptoms of infection and the need for emergent treatment should these be experienced. For each option we discussed the ability of each procedure to clear the patient of their stone burden.   For observation I described the risks which include but are not limited to silent renal damage, life-threatening infection, need  for emergent surgery, failure to pass stone and pain.   For ureteroscopy I described the risks which include bleeding, infection, damage to contiguous structures, positioning injury, ureteral stricture, ureteral avulsion, ureteral injury, need for prolonged ureteral stent, inability to perform ureteroscopy, need for an interval procedure, inability to clear stone burden, stent discomfort/pain, heart attack, stroke, pulmonary embolus and the inherent risks with general anesthesia.   For shockwave lithotripsy I described the risks which include arrhythmia, kidney contusion, kidney hemorrhage, need for transfusion, pain, inability to adequately break up stone, inability to pass stone fragments, Steinstrasse, infection associated with obstructing stones, need for alternate surgical procedure, need for repeat shockwave lithotripsy, MI, CVA, PE and the inherent risks with anesthesia/conscious sedation.   For PCNL I described the risks including positioning injury, pneumothorax, hydrothorax, need for chest tube, inability to clear stone burden, renal laceration, arterial venous fistula or malformation, need for embolization of kidney, loss of kidney or renal function, need for repeat procedure, need for prolonged nephrostomy tube, ureteral avulsion, MI, CVA, PE and the inherent risks of general anesthesia.   - The patient would like to proceed with right ureteroscopic stone extraction.

## 2020-02-26 NOTE — H&P (Signed)
nephrolithiasis  HPI: Ms Common is a 24yo here for evaluation of nephrolithiasis. She was seen in the ER on 01/05/2020 with right flank pain and was diagnosed with a 1.2cm right UPJ calculus. She has been having intermittent right flank over the past year. She had a a CT stone study in 4/202 which showed the same stone and at that time was 44mm. This is her first stone event. No family hx of nephrolithiasis. Pain is mild currently . No LUTS.    PMH:     Past Medical History:  Diagnosis Date   Anxiety    Asthma    Depression    DUB (dysfunctional uterine bleeding)    Heartburn    PCOS (polycystic ovarian syndrome)     Surgical History:      Past Surgical History:  Procedure Laterality Date   CESAREAN SECTION     TONSILLECTOMY      Home Medications:       Allergies as of 01/12/2020      Reactions   Zoloft [sertraline Hcl] Other (See Comments)   Suicidal thoughts or ideation         Medication List       Accurate as of January 12, 2020  4:35 PM. If you have any questions, ask your nurse or doctor.        STOP taking these medications   ciprofloxacin 500 MG tablet Commonly known as: CIPRO Stopped by: Nicolette Bang, MD   FLUoxetine 20 MG capsule Commonly known as: PROZAC Stopped by: Nicolette Bang, MD     TAKE these medications   albuterol (2.5 MG/3ML) 0.083% nebulizer solution Commonly known as: PROVENTIL Take 3 mLs (2.5 mg total) by nebulization every 6 (six) hours as needed for wheezing or shortness of breath.   albuterol 108 (90 Base) MCG/ACT inhaler Commonly known as: VENTOLIN HFA Inhale 2 puffs into the lungs every 6 (six) hours as needed for wheezing or shortness of breath.   budesonide-formoterol 80-4.5 MCG/ACT inhaler Commonly known as: SYMBICORT Inhale 2 puffs into the lungs 2 (two) times daily.   medroxyPROGESTERone 150 MG/ML injection Commonly known as: DEPO-PROVERA Inject 1 mL (150 mg total) into the  muscle every 3 (three) months.       Allergies:  Allergies  Allergen Reactions   Zoloft [Sertraline Hcl] Other (See Comments)    Suicidal thoughts or ideation    Family History:      Family History  Problem Relation Age of Onset   Diabetes Mother    Hypertension Mother     Social History:  reports that she has never smoked. She has never used smokeless tobacco. She reports that she does not drink alcohol and does not use drugs.  ROS: All other review of systems were reviewed and are negative except what is noted above in HPI  Physical Exam: BP 123/87    Pulse (!) 111    Temp 98.3 F (36.8 C)    Ht 5\' 3"  (1.6 m)    Wt 269 lb 3.2 oz (122.1 kg)    BMI 47.69 kg/m   Constitutional:  Alert and oriented, No acute distress. HEENT: [place] AT, moist mucus membranes.  Trachea midline, no masses. Cardiovascular: No clubbing, cyanosis, or edema. Respiratory: Normal respiratory effort, no increased work of breathing. GI: Abdomen is soft, nontender, nondistended, no abdominal masses GU: No CVA tenderness.  Lymph: No cervical or inguinal lymphadenopathy. Skin: No rashes, bruises or suspicious lesions. Neurologic: Grossly intact, no focal deficits, moving all 4  extremities. Psychiatric: Normal mood and affect.  Laboratory Data: Recent Labs       Lab Results  Component Value Date   WBC 13.8 (H) 01/05/2020   HGB 11.1 (L) 01/05/2020   HCT 37.1 01/05/2020   MCV 68.2 (L) 01/05/2020   PLT 455 (H) 01/05/2020      Recent Labs       Lab Results  Component Value Date   CREATININE 0.79 01/05/2020      Recent Labs  No results found for: PSA    Recent Labs  No results found for: TESTOSTERONE    Recent Labs       Lab Results  Component Value Date   HGBA1C 5.6 01/01/2014      Urinalysis Labs (Brief)          Component Value Date/Time   COLORURINE YELLOW 01/05/2020 1113   APPEARANCEUR Cloudy (A) 01/12/2020 1621   LABSPEC 1.014  01/05/2020 1113   PHURINE 6.0 01/05/2020 1113   GLUCOSEU Negative 01/12/2020 1621   HGBUR SMALL (A) 01/05/2020 1113   BILIRUBINUR Negative 01/12/2020 1621   KETONESUR NEGATIVE 01/05/2020 1113   PROTEINUR 1+ (A) 01/12/2020 1621   PROTEINUR NEGATIVE 01/05/2020 1113   UROBILINOGEN 0.2 09/14/2013 2238   NITRITE Negative 01/12/2020 1621   NITRITE NEGATIVE 01/05/2020 1113   LEUKOCYTESUR 2+ (A) 01/12/2020 1621   LEUKOCYTESUR SMALL (A) 01/05/2020 1113      Recent Labs       Lab Results  Component Value Date   LABMICR See below: 01/12/2020   WBCUA >30 (A) 01/12/2020   LABEPIT 0-10 01/12/2020   BACTERIA Few (A) 01/12/2020      Pertinent Imaging: CT 01/05/2020: Images reviewed and discussed with the patient  No results found for this or any previous visit.  No results found for this or any previous visit.  No results found for this or any previous visit.  No results found for this or any previous visit.  No results found for this or any previous visit.  No results found for this or any previous visit.  No results found for this or any previous visit.  Results for orders placed during the hospital encounter of 01/05/20  CT Renal Stone Study  Narrative CLINICAL DATA:  Right flank pain x2 days.  EXAM: CT ABDOMEN AND PELVIS WITHOUT CONTRAST  TECHNIQUE: Multidetector CT imaging of the abdomen and pelvis was performed following the standard protocol without IV contrast.  COMPARISON:  October 02, 2018  FINDINGS: Lower chest: No acute abnormality.  Hepatobiliary: No focal liver abnormality is seen. No gallstones, gallbladder wall thickening, or biliary dilatation.  Pancreas: Unremarkable. No pancreatic ductal dilatation or surrounding inflammatory changes.  Spleen: Normal in size without focal abnormality.  Adrenals/Urinary Tract: Adrenal glands are unremarkable. Kidneys are normal in size, without focal lesions. A 1.2 cm renal  stone is seen within the distal aspect of the right renal pelvis. Mild to moderate severity associated right-sided hydronephrosis is seen. Bladder is unremarkable.  Stomach/Bowel: Stomach is within normal limits. Appendix appears normal. No evidence of bowel wall thickening, distention, or inflammatory changes.  Vascular/Lymphatic: No significant vascular findings are present. No enlarged abdominal or pelvic lymph nodes.  Reproductive: Uterus is positioned to the left of midline and is otherwise normal in appearance. The bilateral adnexa are unremarkable.  Other: No abdominal wall hernia or abnormality. No abdominopelvic ascites.  Musculoskeletal: No acute or significant osseous findings.  IMPRESSION: 1. 1.2 cm partially obstructing renal stone within the distal aspect of  the right renal pelvis with mild to moderate severity associated right-sided hydronephrosis.   Electronically Signed By: Virgina Norfolk M.D. On: 01/05/2020 15:26   Assessment & Plan:    1. Kidney stones -We discussed the management of kidney stones. These options include observation, ureteroscopy, shockwave lithotripsy (ESWL) and percutaneous nephrolithotomy (PCNL). We discussed which options are relevant to the patient's stone(s). We discussed the natural history of kidney stones as well as the complications of untreated stones and the impact on quality of life without treatment as well as with each of the above listed treatments. We also discussed the efficacy of each treatment in its ability to clear the stone burden. With any of these management options I discussed the signs and symptoms of infection and the need for emergent treatment should these be experienced. For each option we discussed the ability of each procedure to clear the patient of their stone burden.   For observation I described the risks which include but are not limited to silent renal damage, life-threatening infection, need  for emergent surgery, failure to pass stone and pain.   For ureteroscopy I described the risks which include bleeding, infection, damage to contiguous structures, positioning injury, ureteral stricture, ureteral avulsion, ureteral injury, need for prolonged ureteral stent, inability to perform ureteroscopy, need for an interval procedure, inability to clear stone burden, stent discomfort/pain, heart attack, stroke, pulmonary embolus and the inherent risks with general anesthesia.   For shockwave lithotripsy I described the risks which include arrhythmia, kidney contusion, kidney hemorrhage, need for transfusion, pain, inability to adequately break up stone, inability to pass stone fragments, Steinstrasse, infection associated with obstructing stones, need for alternate surgical procedure, need for repeat shockwave lithotripsy, MI, CVA, PE and the inherent risks with anesthesia/conscious sedation.   For PCNL I described the risks including positioning injury, pneumothorax, hydrothorax, need for chest tube, inability to clear stone burden, renal laceration, arterial venous fistula or malformation, need for embolization of kidney, loss of kidney or renal function, need for repeat procedure, need for prolonged nephrostomy tube, ureteral avulsion, MI, CVA, PE and the inherent risks of general anesthesia.   - The patient would like to proceed with right ureteroscopic stone extraction.

## 2020-02-26 NOTE — Discharge Instructions (Signed)

## 2020-02-26 NOTE — Anesthesia Procedure Notes (Signed)
Procedure Name: LMA Insertion Date/Time: 02/26/2020 1:59 PM Performed by: Jonna Munro, CRNA Pre-anesthesia Checklist: Patient identified, Emergency Drugs available, Suction available, Patient being monitored and Timeout performed Patient Re-evaluated:Patient Re-evaluated prior to induction Oxygen Delivery Method: Circle system utilized Preoxygenation: Pre-oxygenation with 100% oxygen Induction Type: IV induction LMA: LMA inserted LMA Size: 4.0 Number of attempts: 1 Placement Confirmation: positive ETCO2 and breath sounds checked- equal and bilateral Dental Injury: Teeth and Oropharynx as per pre-operative assessment

## 2020-02-26 NOTE — Transfer of Care (Signed)
Immediate Anesthesia Transfer of Care Note  Patient: Stephanie Torres  Procedure(s) Performed: CYSTOSCOPY WITH RETROGRADE PYELOGRAM, URETEROSCOPY WITH STENT PLACEMENT (Right Urethra)  Patient Location: PACU  Anesthesia Type:General  Level of Consciousness: awake, alert , oriented and patient cooperative  Airway & Oxygen Therapy: Patient Spontanous Breathing and Patient connected to nasal cannula oxygen  Post-op Assessment: Report given to RN, Post -op Vital signs reviewed and stable and Patient moving all extremities X 4  Post vital signs: Reviewed and stable  Last Vitals:  Vitals Value Taken Time  BP 140/94 02/26/20 1508  Temp    Pulse 102 02/26/20 1510  Resp 19 02/26/20 1510  SpO2 94 % 02/26/20 1510  Vitals shown include unvalidated device data.  Last Pain:  Vitals:   02/26/20 1231  TempSrc: Oral         Complications: No complications documented.

## 2020-02-26 NOTE — Anesthesia Preprocedure Evaluation (Signed)
Anesthesia Evaluation  Patient identified by MRN, date of birth, ID band Patient awake    Reviewed: Allergy & Precautions, NPO status , Patient's Chart, lab work & pertinent test results  History of Anesthesia Complications Negative for: history of anesthetic complications  Airway Mallampati: II  TM Distance: >3 FB Neck ROM: Full    Dental  (+) Dental Advisory Given, Teeth Intact   Pulmonary shortness of breath and with exertion, asthma ,    Pulmonary exam normal breath sounds clear to auscultation       Cardiovascular METS: 3 - Mets Normal cardiovascular exam Rhythm:Regular Rate:Normal  13-Dec-2017 16:57:26 [facility] ROUTINE RECORD Sinus rhythm When compared with ECG of 04/16/2017 No significant change was found Confirmed by Francine Graven [phone]) on 12/13/2017 4:59:44 PM   Neuro/Psych  Headaches, PSYCHIATRIC DISORDERS Anxiety Depression    GI/Hepatic negative GI ROS, Neg liver ROS,   Endo/Other  Morbid obesity  Renal/GU negative Renal ROS     Musculoskeletal   Abdominal   Peds  Hematology  (+) anemia ,   Anesthesia Other Findings   Reproductive/Obstetrics                             Anesthesia Physical Anesthesia Plan  ASA: III  Anesthesia Plan: General   Post-op Pain Management:    Induction: Intravenous  PONV Risk Score and Plan: 4 or greater and Ondansetron, Dexamethasone and Midazolam  Airway Management Planned: Oral ETT  Additional Equipment:   Intra-op Plan:   Post-operative Plan: Extubation in OR  Informed Consent: I have reviewed the patients History and Physical, chart, labs and discussed the procedure including the risks, benefits and alternatives for the proposed anesthesia with the patient or authorized representative who has indicated his/her understanding and acceptance.     Dental advisory given  Plan Discussed with: CRNA and  Surgeon  Anesthesia Plan Comments:         Anesthesia Quick Evaluation

## 2020-02-26 NOTE — Op Note (Signed)
Preoperative diagnosis: Right renal calculus  Postoperative diagnosis: Same  Procedure: 1 cystoscopy 2.  right retrograde pyelography 3.  Intraoperative fluoroscopy, under one hour, with interpretation 4.  Right diagnostic ureteroscopy 5. Right 6x26 JJ ureteral stent placement  Attending: Rosie Fate  Anesthesia: General  Estimated blood loss: None  Drains: right 6x26 JJ ureteral stent  Specimens: urine for culture  Antibiotics: ancef  Findings: moderate right hydronephrosis from a 1.2cm UPJ calculus. Purulent urine drained from right kidney.  Indications: Patient is a 24 year old female with a history of a right UPJ calculus.  After discussing treatment options, she decided proceed with right ureteroscopic stone extraction  Procedure her in detail: The patient was brought to the operating room and a brief timeout was done to ensure correct patient, correct procedure, correct site.  General anesthesia was administered patient was placed in dorsal lithotomy position.  Her genitalia was then prepped and draped in usual sterile fashion.  A rigid 39 French cystoscope was passed in the urethra and the bladder.  Bladder was inspected free masses or lesions.  the right ureteral orifices were in the normal orthotopic locations.  a 6 french ureteral catheter was then instilled into the right ureter orifice.  a gentle retrograde was obtained and findings noted above.  we then placed a zip wire through the ureteral catheter and advanced up to the renal pelvis.  we then removed the cystoscope and cannulated the right ureteral orifice with a semirigid ureteroscope.  we then performed ureteroscopy up to the level of the UPJ. No stone or tumor was encountered. We then advanced a sensor wire throught the scope and up to the UPJ. We then advanced a 12/14x35 access sheath up to the UPJ. We then used a flexible ureteroscope to navigate to the UPJ calculus. Upon attempting the manipulate the calculus  copious amounts of purulent urine drained from behind the calculus. We elects to abort the procedure and place a ureteral stent. The access sheath was removed under direct vision and no injury was noted to the right ureter. We then advanced a 6x26 JJ ureteral stent over the zipwire and into the renal pelvis. The wire was removed and good coil was noted in the renal pelvis under fluorscopy and the bladder under direct vision. the bladder was then drained and this concluded the procedure which was well tolerated by patient.  Complications: None  Condition: Stable, extubated, transferred to PACU  Plan: Pt is to followup in 2 weeks for repeat ureteroscopic stone extraction

## 2020-02-26 NOTE — Anesthesia Postprocedure Evaluation (Signed)
Anesthesia Post Note  Patient: Stephanie Torres  Procedure(s) Performed: CYSTOSCOPY WITH RETROGRADE PYELOGRAM, URETEROSCOPY WITH STENT PLACEMENT (Right Urethra)  Patient location during evaluation: PACU Anesthesia Type: General Level of consciousness: awake, oriented, awake and alert and patient cooperative Pain management: satisfactory to patient Vital Signs Assessment: post-procedure vital signs reviewed and stable Respiratory status: spontaneous breathing, respiratory function stable and nonlabored ventilation Cardiovascular status: stable Postop Assessment: no apparent nausea or vomiting Anesthetic complications: no   No complications documented.   Last Vitals:  Vitals:   02/26/20 1231  Pulse: (!) 103  Resp: 19  Temp: 36.8 C  SpO2: 98%    Last Pain:  Vitals:   02/26/20 1231  TempSrc: Oral                 Sharna Gabrys

## 2020-02-27 ENCOUNTER — Encounter (HOSPITAL_COMMUNITY): Payer: Self-pay | Admitting: Urology

## 2020-02-27 ENCOUNTER — Other Ambulatory Visit: Payer: Medicaid Other | Admitting: Urology

## 2020-02-27 MED ORDER — SODIUM CHLORIDE 0.9 % IR SOLN
Status: DC | PRN
  Administered 2020-02-26: 3000 mL via INTRAVESICAL

## 2020-03-02 ENCOUNTER — Telehealth: Payer: Self-pay

## 2020-03-03 LAB — ANAEROBIC CULTURE

## 2020-03-04 NOTE — Telephone Encounter (Signed)
That's fine on the work note. Have her take AZO for the discomfort

## 2020-03-04 NOTE — Telephone Encounter (Signed)
Pt made aware. Pt states she is almost out of pain med, can she get a refill and she needs something for a yeast infection.

## 2020-03-05 ENCOUNTER — Other Ambulatory Visit: Payer: Self-pay

## 2020-03-05 ENCOUNTER — Telehealth (INDEPENDENT_AMBULATORY_CARE_PROVIDER_SITE_OTHER): Payer: Medicaid Other | Admitting: Nurse Practitioner

## 2020-03-05 DIAGNOSIS — F419 Anxiety disorder, unspecified: Secondary | ICD-10-CM

## 2020-03-05 DIAGNOSIS — F32A Depression, unspecified: Secondary | ICD-10-CM | POA: Diagnosis not present

## 2020-03-05 NOTE — Progress Notes (Signed)
   Subjective:    Patient ID: Stephanie Torres, female    DOB: 03/31/1996, 24 y.o.   MRN: [id number]  HPI Patient calls in today to follow up on depression and anxiety. She has not been taking her celexa or klonopin since her surgery because she is taking pain medications and did not like the way they reacted together.  Has another procedure scheduled for next Thursday. Patient complains of yeast infection.  Patient has had 1 surgery for her kidney stones, scheduled for second procedure next Thursday.  Has noticed vaginal yeast symptoms since being on antibiotics for urinary problems.  Has stopped both her Celexa and Klonopin after starting her oxycodone since she did not like how it made her feel.  Denies any suicidal or homicidal thoughts or ideation. Review of Systems Visit was originally scheduled face-to-face but was changed to a phone visit.    Objective:   Physical Exam Today's visit was via telephone Physical exam was not possible for this visit       Assessment & Plan:  Virtual Visit via Telephone Note  I connected with Stephanie Torres on 03/06/20 at  1:40 PM EDT by telephone and verified that I am speaking with the correct person using two identifiers.  Location: Patient: home Provider: office   I discussed the limitations, risks, security and privacy concerns of performing an evaluation and management service by telephone and the availability of in person appointments. I also discussed with the patient that there may be a patient responsible charge related to this service. The patient expressed understanding and agreed to proceed.   History of Present Illness: Patient has had 1 surgery for her kidney stones, scheduled for second procedure next Thursday.  Has noticed vaginal yeast symptoms since being on antibiotics for urinary problems.  Has stopped both her Celexa and Klonopin after starting her oxycodone since she did not like how it made her feel.  Denies any suicidal  or homicidal thoughts or ideation.   Observations/Objective: Today's visit was via telephone Physical exam was not possible for this visit Alert, oriented.  Mildly anxious affect.  Thoughts logical coherent and relevant.  Assessment and Plan: Problem List Items Addressed This Visit      Other   Anxiety and depression - Primary       Follow Up Instructions: Instructed patient not to take clonazepam if she is taking pain medicine.  Verbalizes understanding.  Recommend she restart Celexa as directed considering her mental health issues.  Patient to call back if no improvement or if any adverse effects.   I discussed the assessment and treatment plan with the patient. The patient was provided an opportunity to ask questions and all were answered. The patient agreed with the plan and demonstrated an understanding of the instructions.   The patient was advised to call back or seek an in-person evaluation if the symptoms worsen or if the condition fails to improve as anticipated.  I provided 15 minutes of non-face-to-face time during this encounter.   Nilda Simmer, NP

## 2020-03-06 ENCOUNTER — Encounter: Payer: Self-pay | Admitting: Nurse Practitioner

## 2020-03-06 MED ORDER — FLUCONAZOLE 150 MG PO TABS: ORAL_TABLET | ORAL | 0 refills | Status: DC

## 2020-03-08 NOTE — Patient Instructions (Signed)
Stephanie Torres  03/08/2020     @PREFPERIOPPHARMACY @   Your procedure is scheduled on 03/11/2020  Report to [facility] at  74  A.M.  Call this number if you have problems the morning of surgery:  [phone]   Remember:  Do not eat or drink after midnight.                        Take these medicines the morning of surgery with A SIP OF WATER  Celexa, clonazepam(if needed), zofran(if needed), oxycodone(if needed), flomax.    Do not wear jewelry, make-up or nail polish.  Do not wear lotions, powders, or perfumes. Please wear deodorant and brush your teeth.  Do not shave 48 hours prior to surgery.  Men may shave face and neck.  Do not bring valuables to the hospital.  [facility] is not responsible for any belongings or valuables.  Contacts, dentures or bridgework may not be worn into surgery.  Leave your suitcase in the car.  After surgery it may be brought to your room.  For patients admitted to the hospital, discharge time will be determined by your treatment team.  Patients discharged the day of surgery will not be allowed to drive home.   Name and phone number of your driver:   family Special instructions:  DO NOT smoke the morning of your procedure.  Please read over the following fact sheets that you were given. Anesthesia Post-op Instructions and Care and Recovery After Surgery       Ureteral Stent Implantation, Care After This sheet gives you information about how to care for yourself after your procedure. Your health care provider may also give you more specific instructions. If you have problems or questions, contact your health care provider. What can I expect after the procedure? After the procedure, it is common to have:  Nausea.  Mild pain when you urinate. You may feel this pain in your lower back or lower abdomen. The pain should stop within a few minutes after you urinate. This may last for up to 1 week.  A small amount of blood in your  urine for several days. Follow these instructions at home: Medicines  Take over-the-counter and prescription medicines only as told by your health care provider.  If you were prescribed an antibiotic medicine, take it as told by your health care provider. Do not stop taking the antibiotic even if you start to feel better.  Do not drive for 24 hours if you were given a sedative during your procedure.  Ask your health care provider if the medicine prescribed to you requires you to avoid driving or using heavy machinery. Activity  Rest as told by your health care provider.  Avoid sitting for a long time without moving. Get up to take short walks every 1-2 hours. This is important to improve blood flow and breathing. Ask for help if you feel weak or unsteady.  Return to your normal activities as told by your health care provider. Ask your health care provider what activities are safe for you. General instructions   Watch for any blood in your urine. Call your health care provider if the amount of blood in your urine increases.  If you have a catheter: ? Follow instructions from your health care provider about taking care of your catheter and collection bag. ? Do not take baths, swim, or use a hot tub until your  health care provider approves. Ask your health care provider if you may take showers. You may only be allowed to take sponge baths.  Drink enough fluid to keep your urine pale yellow.  Do not use any products that contain nicotine or tobacco, such as cigarettes, e-cigarettes, and chewing tobacco. These can delay healing after surgery. If you need help quitting, ask your health care provider.  Keep all follow-up visits as told by your health care provider. This is important. Contact a health care provider if:  You have pain that gets worse or does not get better with medicine, especially pain when you urinate.  You have difficulty urinating.  You feel nauseous or you vomit  repeatedly during a period of more than 2 days after the procedure. Get help right away if:  Your urine is dark red or has blood clots in it.  You are leaking urine (have incontinence).  The end of the stent comes out of your urethra.  You cannot urinate.  You have sudden, sharp, or severe pain in your abdomen or lower back.  You have a fever.  You have swelling or pain in your legs.  You have difficulty breathing. Summary  After the procedure, it is common to have mild pain when you urinate that goes away within a few minutes after you urinate. This may last for up to 1 week.  Watch for any blood in your urine. Call your health care provider if the amount of blood in your urine increases.  Take over-the-counter and prescription medicines only as told by your health care provider.  Drink enough fluid to keep your urine pale yellow. This information is not intended to replace advice given to you by your health care provider. Make sure you discuss any questions you have with your health care provider. Document Revised: 02/26/2018 Document Reviewed: 02/27/2018 Elsevier Patient Education  2020 [employer].  Cystoscopy Cystoscopy is a procedure that is used to help diagnose and sometimes treat conditions that affect the lower urinary tract. The lower urinary tract includes the bladder and the urethra. The urethra is the tube that drains urine from the bladder. Cystoscopy is done using a thin, tube-shaped instrument with a light and camera at the end (cystoscope). The cystoscope may be hard or flexible, depending on the goal of the procedure. The cystoscope is inserted through the urethra, into the bladder. Cystoscopy may be recommended if you have:  Urinary tract infections that keep coming back.  Blood in the urine (hematuria).  An inability to control when you urinate (urinary incontinence) or an overactive bladder.  Unusual cells found in a urine sample.  A blockage in the  urethra, such as a urinary stone.  Painful urination.  An abnormality in the bladder found during an intravenous pyelogram (IVP) or CT scan. Cystoscopy may also be done to remove a sample of tissue to be examined under a microscope (biopsy). Tell a health care provider about:  Any allergies you have.  All medicines you are taking, including vitamins, herbs, eye drops, creams, and over-the-counter medicines.  Any problems you or family members have had with anesthetic medicines.  Any blood disorders you have.  Any surgeries you have had.  Any medical conditions you have.  Whether you are pregnant or may be pregnant. What are the risks? Generally, this is a safe procedure. However, problems may occur, including:  Infection.  Bleeding.  Allergic reactions to medicines.  Damage to other structures or organs. What happens before the  procedure?  Ask your health care provider about: ? Changing or stopping your regular medicines. This is especially important if you are taking diabetes medicines or blood thinners. ? Taking medicines such as aspirin and ibuprofen. These medicines can thin your blood. Do not take these medicines unless your health care provider tells you to take them. ? Taking over-the-counter medicines, vitamins, herbs, and supplements.  Follow instructions from your health care provider about eating or drinking restrictions.  Ask your health care provider what steps will be taken to help prevent infection. These may include: ? Washing skin with a germ-killing soap. ? Taking antibiotic medicine.  You may have an exam or testing, such as: ? X-rays of the bladder, urethra, or kidneys. ? Urine tests to check for signs of infection.  Plan to have someone take you home from the hospital or clinic. What happens during the procedure?   You will be given one or more of the following: ? A medicine to help you relax (sedative). ? A medicine to numb the area (local  anesthetic).  The area around the opening of your urethra will be cleaned.  The cystoscope will be passed through your urethra into your bladder.  Germ-free (sterile) fluid will flow through the cystoscope to fill your bladder. The fluid will stretch your bladder so that your health care provider can clearly examine your bladder walls.  Your doctor will look at the urethra and bladder. Your doctor may take a biopsy or remove stones.  The cystoscope will be removed, and your bladder will be emptied. The procedure may vary among health care providers and hospitals. What can I expect after the procedure? After the procedure, it is common to have:  Some soreness or pain in your abdomen and urethra.  Urinary symptoms. These include: ? Mild pain or burning when you urinate. Pain should stop within a few minutes after you urinate. This may last for up to 1 week. ? A small amount of blood in your urine for several days. ? Feeling like you need to urinate but producing only a small amount of urine. Follow these instructions at home: Medicines  Take over-the-counter and prescription medicines only as told by your health care provider.  If you were prescribed an antibiotic medicine, take it as told by your health care provider. Do not stop taking the antibiotic even if you start to feel better. General instructions  Return to your normal activities as told by your health care provider. Ask your health care provider what activities are safe for you.  Do not drive for 24 hours if you were given a sedative during your procedure.  Watch for any blood in your urine. If the amount of blood in your urine increases, call your health care provider.  Follow instructions from your health care provider about eating or drinking restrictions.  If a tissue sample was removed for testing (biopsy) during your procedure, it is up to you to get your test results. Ask your health care provider, or the  department that is doing the test, when your results will be ready.  Drink enough fluid to keep your urine pale yellow.  Keep all follow-up visits as told by your health care provider. This is important. Contact a health care provider if you:  Have pain that gets worse or does not get better with medicine, especially pain when you urinate.  Have trouble urinating.  Have more blood in your urine. Get help right away if you:  Have  blood clots in your urine.  Have abdominal pain.  Have a fever or chills.  Are unable to urinate. Summary  Cystoscopy is a procedure that is used to help diagnose and sometimes treat conditions that affect the lower urinary tract.  Cystoscopy is done using a thin, tube-shaped instrument with a light and camera at the end.  After the procedure, it is common to have some soreness or pain in your abdomen and urethra.  Watch for any blood in your urine. If the amount of blood in your urine increases, call your health care provider.  If you were prescribed an antibiotic medicine, take it as told by your health care provider. Do not stop taking the antibiotic even if you start to feel better. This information is not intended to replace advice given to you by your health care provider. Make sure you discuss any questions you have with your health care provider. Document Revised: 05/14/2018 Document Reviewed: 05/14/2018 Elsevier Patient Education  [place]. How to Use Chlorhexidine for Bathing Chlorhexidine gluconate (CHG) is a germ-killing (antiseptic) solution that is used to clean the skin. It can get rid of the bacteria that normally live on the skin and can keep them away for about 24 hours. To clean your skin with CHG, you may be given:  A CHG solution to use in the shower or as part of a sponge bath.  A prepackaged cloth that contains CHG. Cleaning your skin with CHG may help lower the risk for infection:  While you are staying in the  intensive care unit of the hospital.  If you have a vascular access, such as a central line, to provide short-term or long-term access to your veins.  If you have a catheter to drain urine from your bladder.  If you are on a ventilator. A ventilator is a machine that helps you breathe by moving air in and out of your lungs.  After surgery. What are the risks? Risks of using CHG include:  A skin reaction.  Hearing loss, if CHG gets in your ears.  Eye injury, if CHG gets in your eyes and is not rinsed out.  The CHG product catching fire. Make sure that you avoid smoking and flames after applying CHG to your skin. Do not use CHG:  If you have a chlorhexidine allergy or have previously reacted to chlorhexidine.  On babies younger than 61 months of age. How to use CHG solution  Use CHG only as told by your health care provider, and follow the instructions on the label.  Use the full amount of CHG as directed. Usually, this is one bottle. During a shower Follow these steps when using CHG solution during a shower (unless your health care provider gives you different instructions): 1. Start the shower. 2. Use your normal soap and shampoo to wash your face and hair. 3. Turn off the shower or move out of the shower stream. 4. Pour the CHG onto a clean washcloth. Do not use any type of brush or rough-edged sponge. 5. Starting at your neck, lather your body down to your toes. Make sure you follow these instructions: ? If you will be having surgery, pay special attention to the part of your body where you will be having surgery. Scrub this area for at least 1 minute. ? Do not use CHG on your head or face. If the solution gets into your ears or eyes, rinse them well with water. ? Avoid your genital area. ?  Avoid any areas of skin that have broken skin, cuts, or scrapes. ? Scrub your back and under your arms. Make sure to wash skin folds. 6. Let the lather sit on your skin for 1-2 minutes  or as long as told by your health care provider. 7. Thoroughly rinse your entire body in the shower. Make sure that all body creases and crevices are rinsed well. 8. Dry off with a clean towel. Do not put any substances on your body afterward--such as powder, lotion, or perfume--unless you are told to do so by your health care provider. Only use lotions that are recommended by the manufacturer. 9. Put on clean clothes or pajamas. 10. If it is the night before your surgery, sleep in clean sheets.  During a sponge bath Follow these steps when using CHG solution during a sponge bath (unless your health care provider gives you different instructions): 1. Use your normal soap and shampoo to wash your face and hair. 2. Pour the CHG onto a clean washcloth. 3. Starting at your neck, lather your body down to your toes. Make sure you follow these instructions: ? If you will be having surgery, pay special attention to the part of your body where you will be having surgery. Scrub this area for at least 1 minute. ? Do not use CHG on your head or face. If the solution gets into your ears or eyes, rinse them well with water. ? Avoid your genital area. ? Avoid any areas of skin that have broken skin, cuts, or scrapes. ? Scrub your back and under your arms. Make sure to wash skin folds. 4. Let the lather sit on your skin for 1-2 minutes or as long as told by your health care provider. 5. Using a different clean, wet washcloth, thoroughly rinse your entire body. Make sure that all body creases and crevices are rinsed well. 6. Dry off with a clean towel. Do not put any substances on your body afterward--such as powder, lotion, or perfume--unless you are told to do so by your health care provider. Only use lotions that are recommended by the manufacturer. 7. Put on clean clothes or pajamas. 8. If it is the night before your surgery, sleep in clean sheets. How to use CHG prepackaged cloths  Only use CHG cloths as  told by your health care provider, and follow the instructions on the label.  Use the CHG cloth on clean, dry skin.  Do not use the CHG cloth on your head or face unless your health care provider tells you to.  When washing with the CHG cloth: ? Avoid your genital area. ? Avoid any areas of skin that have broken skin, cuts, or scrapes. Before surgery Follow these steps when using a CHG cloth to clean before surgery (unless your health care provider gives you different instructions): 1. Using the CHG cloth, vigorously scrub the part of your body where you will be having surgery. Scrub using a back-and-forth motion for 3 minutes. The area on your body should be completely wet with CHG when you are done scrubbing. 2. Do not rinse. Discard the cloth and let the area air-dry. Do not put any substances on the area afterward, such as powder, lotion, or perfume. 3. Put on clean clothes or pajamas. 4. If it is the night before your surgery, sleep in clean sheets.  For general bathing Follow these steps when using CHG cloths for general bathing (unless your health care provider gives you different instructions). 1.  Use a separate CHG cloth for each area of your body. Make sure you wash between any folds of skin and between your fingers and toes. Wash your body in the following order, switching to a new cloth after each step: ? The front of your neck, shoulders, and chest. ? Both of your arms, under your arms, and your hands. ? Your stomach and groin area, avoiding the genitals. ? Your right leg and foot. ? Your left leg and foot. ? The back of your neck, your back, and your buttocks. 2. Do not rinse. Discard the cloth and let the area air-dry. Do not put any substances on your body afterward--such as powder, lotion, or perfume--unless you are told to do so by your health care provider. Only use lotions that are recommended by the manufacturer. 3. Put on clean clothes or pajamas. Contact a health  care provider if:  Your skin gets irritated after scrubbing.  You have questions about using your solution or cloth. Get help right away if:  Your eyes become very red or swollen.  Your eyes itch badly.  Your skin itches badly and is red or swollen.  Your hearing changes.  You have trouble seeing.  You have swelling or tingling in your mouth or throat.  You have trouble breathing.  You swallow any chlorhexidine. Summary  Chlorhexidine gluconate (CHG) is a germ-killing (antiseptic) solution that is used to clean the skin. Cleaning your skin with CHG may help to lower your risk for infection.  You may be given CHG to use for bathing. It may be in a bottle or in a prepackaged cloth to use on your skin. Carefully follow your health care provider's instructions and the instructions on the product label.  Do not use CHG if you have a chlorhexidine allergy.  Contact your health care provider if your skin gets irritated after scrubbing. This information is not intended to replace advice given to you by your health care provider. Make sure you discuss any questions you have with your health care provider. Document Revised: 08/08/2018 Document Reviewed: 04/19/2017 Elsevier Patient Education  [place].

## 2020-03-09 ENCOUNTER — Other Ambulatory Visit (HOSPITAL_COMMUNITY)
Admission: RE | Admit: 2020-03-09 | Discharge: 2020-03-09 | Disposition: A | Discharge status: Home / Self Care | Payer: Medicaid Other | Source: Ambulatory Visit | Attending: Urology | Admitting: Urology

## 2020-03-09 ENCOUNTER — Encounter (HOSPITAL_COMMUNITY)
Admission: RE | Admit: 2020-03-09 | Discharge: 2020-03-09 | Disposition: A | Discharge status: Home / Self Care | Payer: Medicaid Other | Source: Ambulatory Visit | Attending: Urology | Admitting: Urology

## 2020-03-09 ENCOUNTER — Encounter (HOSPITAL_COMMUNITY): Payer: Self-pay

## 2020-03-09 ENCOUNTER — Other Ambulatory Visit: Payer: Self-pay

## 2020-03-09 DIAGNOSIS — Z01812 Encounter for preprocedural laboratory examination: Secondary | ICD-10-CM | POA: Insufficient documentation

## 2020-03-09 DIAGNOSIS — Z20822 Contact with and (suspected) exposure to covid-19: Secondary | ICD-10-CM | POA: Diagnosis not present

## 2020-03-09 LAB — PREGNANCY, URINE: Preg Test, Ur: NEGATIVE

## 2020-03-09 LAB — SARS CORONAVIRUS 2 (TAT 6-24 HRS): SARS Coronavirus 2: NEGATIVE

## 2020-03-11 ENCOUNTER — Ambulatory Visit (HOSPITAL_COMMUNITY): Payer: Medicaid Other | Admitting: Anesthesiology

## 2020-03-11 ENCOUNTER — Ambulatory Visit (HOSPITAL_COMMUNITY)
Admission: RE | Admit: 2020-03-11 | Discharge: 2020-03-11 | Disposition: A | Discharge status: Home / Self Care | Payer: Medicaid Other | Attending: Urology | Admitting: Urology

## 2020-03-11 ENCOUNTER — Encounter (HOSPITAL_COMMUNITY)
Admission: RE | Disposition: A | Discharge status: Home / Self Care | Payer: Self-pay | Source: Home / Self Care | Attending: Urology

## 2020-03-11 ENCOUNTER — Ambulatory Visit (HOSPITAL_COMMUNITY): Payer: Medicaid Other

## 2020-03-11 ENCOUNTER — Encounter (HOSPITAL_COMMUNITY): Payer: Self-pay | Admitting: Urology

## 2020-03-11 DIAGNOSIS — Z7951 Long term (current) use of inhaled steroids: Secondary | ICD-10-CM | POA: Diagnosis not present

## 2020-03-11 DIAGNOSIS — N200 Calculus of kidney: Secondary | ICD-10-CM | POA: Diagnosis not present

## 2020-03-11 DIAGNOSIS — Z79899 Other long term (current) drug therapy: Secondary | ICD-10-CM | POA: Insufficient documentation

## 2020-03-11 DIAGNOSIS — J45909 Unspecified asthma, uncomplicated: Secondary | ICD-10-CM | POA: Insufficient documentation

## 2020-03-11 DIAGNOSIS — Z888 Allergy status to other drugs, medicaments and biological substances status: Secondary | ICD-10-CM | POA: Diagnosis not present

## 2020-03-11 DIAGNOSIS — D649 Anemia, unspecified: Secondary | ICD-10-CM | POA: Diagnosis not present

## 2020-03-11 DIAGNOSIS — F418 Other specified anxiety disorders: Secondary | ICD-10-CM | POA: Diagnosis not present

## 2020-03-11 HISTORY — PX: HOLMIUM LASER APPLICATION: SHX5852

## 2020-03-11 HISTORY — PX: CYSTOSCOPY WITH RETROGRADE PYELOGRAM, URETEROSCOPY AND STENT PLACEMENT: SHX5789

## 2020-03-11 SURGERY — CYSTOURETEROSCOPY, WITH RETROGRADE PYELOGRAM AND STENT INSERTION
Anesthesia: General | Laterality: Right

## 2020-03-11 MED ORDER — FENTANYL CITRATE (PF) 100 MCG/2ML IJ SOLN
INTRAMUSCULAR | Status: AC
  Filled 2020-03-11: qty 2

## 2020-03-11 MED ORDER — CHLORHEXIDINE GLUCONATE 0.12 % MT SOLN: 15.0000 mL | Freq: Once | OROMUCOSAL | Status: DC

## 2020-03-11 MED ORDER — WATER FOR IRRIGATION, STERILE IR SOLN
Status: DC | PRN
  Administered 2020-03-11: 1000 mL

## 2020-03-11 MED ORDER — DEXAMETHASONE SODIUM PHOSPHATE 10 MG/ML IJ SOLN
INTRAMUSCULAR | Status: DC | PRN
  Administered 2020-03-11: 8 mg via INTRAVENOUS

## 2020-03-11 MED ORDER — SODIUM CHLORIDE 0.9 % IV SOLN
2.0000 g | INTRAVENOUS | Status: AC
  Administered 2020-03-11: 2 g via INTRAVENOUS
  Filled 2020-03-11: qty 20

## 2020-03-11 MED ORDER — FENTANYL CITRATE (PF) 100 MCG/2ML IJ SOLN
25.0000 ug | INTRAMUSCULAR | Status: DC | PRN
  Administered 2020-03-11 (×2): 50 ug via INTRAVENOUS
  Filled 2020-03-11 (×2): qty 2

## 2020-03-11 MED ORDER — ORAL CARE MOUTH RINSE: 15.0000 mL | Freq: Once | OROMUCOSAL | Status: DC

## 2020-03-11 MED ORDER — ONDANSETRON HCL 4 MG/2ML IJ SOLN
INTRAMUSCULAR | Status: DC | PRN
  Administered 2020-03-11: 4 mg via INTRAVENOUS

## 2020-03-11 MED ORDER — SODIUM CHLORIDE 0.9 % IR SOLN
Status: DC | PRN
  Administered 2020-03-11: 3000 mL
  Administered 2020-03-11: 3000 mL via INTRAVESICAL

## 2020-03-11 MED ORDER — LACTATED RINGERS IV SOLN: INTRAVENOUS | Status: DC | PRN

## 2020-03-11 MED ORDER — DIATRIZOATE MEGLUMINE 30 % UR SOLN
URETHRAL | Status: AC
  Filled 2020-03-11: qty 100

## 2020-03-11 MED ORDER — FENTANYL CITRATE (PF) 100 MCG/2ML IJ SOLN
INTRAMUSCULAR | Status: DC | PRN
  Administered 2020-03-11 (×2): 25 ug via INTRAVENOUS
  Administered 2020-03-11: 100 ug via INTRAVENOUS
  Administered 2020-03-11: 50 ug via INTRAVENOUS

## 2020-03-11 MED ORDER — ONDANSETRON HCL 4 MG/2ML IJ SOLN
INTRAMUSCULAR | Status: AC
  Filled 2020-03-11: qty 2

## 2020-03-11 MED ORDER — MIDAZOLAM HCL 2 MG/2ML IJ SOLN
INTRAMUSCULAR | Status: AC
  Filled 2020-03-11: qty 2

## 2020-03-11 MED ORDER — DEXAMETHASONE SODIUM PHOSPHATE 10 MG/ML IJ SOLN
INTRAMUSCULAR | Status: AC
  Filled 2020-03-11: qty 1

## 2020-03-11 MED ORDER — OXYCODONE-ACETAMINOPHEN 5-325 MG PO TABS: 1.0000 | ORAL_TABLET | ORAL | 0 refills | Status: DC | PRN

## 2020-03-11 MED ORDER — MIDAZOLAM HCL 2 MG/2ML IJ SOLN
INTRAMUSCULAR | Status: DC | PRN
  Administered 2020-03-11: 2 mg via INTRAVENOUS

## 2020-03-11 MED ORDER — LACTATED RINGERS IV SOLN: INTRAVENOUS | Status: DC

## 2020-03-11 MED ORDER — ONDANSETRON HCL 4 MG/2ML IJ SOLN: 4.0000 mg | Freq: Once | INTRAMUSCULAR | Status: DC | PRN

## 2020-03-11 MED ORDER — PROPOFOL 10 MG/ML IV BOLUS
INTRAVENOUS | Status: DC | PRN
  Administered 2020-03-11: 200 mg via INTRAVENOUS

## 2020-03-11 MED ORDER — PROPOFOL 10 MG/ML IV BOLUS
INTRAVENOUS | Status: AC
  Filled 2020-03-11: qty 40

## 2020-03-11 SURGICAL SUPPLY — 25 items
BAG DRAIN URO TABLE W/ADPT NS (BAG) ×3 IMPLANT
BAG HAMPER (MISCELLANEOUS) ×3 IMPLANT
CATH INTERMIT  6FR 70CM (CATHETERS) IMPLANT
CLOTH BEACON ORANGE TIMEOUT ST (SAFETY) ×3 IMPLANT
DECANTER SPIKE VIAL GLASS SM (MISCELLANEOUS) ×3 IMPLANT
EXTRACTOR STONE NITINOL NGAGE (UROLOGICAL SUPPLIES) ×3 IMPLANT
FIBER LASER FLEXIVA 200 (UROLOGICAL SUPPLIES) ×3 IMPLANT
GLOVE BIO SURGEON STRL SZ8 (GLOVE) ×3 IMPLANT
GLOVE BIOGEL PI IND STRL 7.0 (GLOVE) ×6 IMPLANT
GLOVE BIOGEL PI INDICATOR 7.0 (GLOVE) ×3
GLOVE ECLIPSE 6.5 STRL STRAW (GLOVE) ×6 IMPLANT
GOWN STRL REUS W/TWL LRG LVL3 (GOWN DISPOSABLE) ×3 IMPLANT
GOWN STRL REUS W/TWL XL LVL3 (GOWN DISPOSABLE) ×3 IMPLANT
GUIDEWIRE STR DUAL SENSOR (WIRE) ×3 IMPLANT
GUIDEWIRE STR ZIPWIRE 035X150 (MISCELLANEOUS) ×3 IMPLANT
IV NS IRRIG 3000ML ARTHROMATIC (IV SOLUTION) ×6 IMPLANT
KIT TURNOVER CYSTO (KITS) ×3 IMPLANT
MANIFOLD NEPTUNE II (INSTRUMENTS) ×3 IMPLANT
PACK CYSTO (CUSTOM PROCEDURE TRAY) ×3 IMPLANT
PAD ARMBOARD 7.5X6 YLW CONV (MISCELLANEOUS) ×3 IMPLANT
SHEATH URET ACCESS 12FR/35CM (UROLOGICAL SUPPLIES) ×3 IMPLANT
STENT URET 6FRX26 CONTOUR (STENTS) ×3 IMPLANT
SYR 10ML LL (SYRINGE) ×3 IMPLANT
TOWEL OR 17X26 4PK STRL BLUE (TOWEL DISPOSABLE) ×3 IMPLANT
WATER STERILE IRR 500ML POUR (IV SOLUTION) ×3 IMPLANT

## 2020-03-11 NOTE — Progress Notes (Signed)
Please excuse Stephanie Torres from work until 03/18/2020.  She had a procedure at [facility].

## 2020-03-11 NOTE — Anesthesia Procedure Notes (Signed)
Procedure Name: LMA Insertion Date/Time: 03/11/2020 12:05 PM Performed by: Vista Deck, CRNA Pre-anesthesia Checklist: Patient identified, Patient being monitored, Emergency Drugs available, Timeout performed and Suction available Patient Re-evaluated:Patient Re-evaluated prior to induction Oxygen Delivery Method: Circle System Utilized Preoxygenation: Pre-oxygenation with 100% oxygen Induction Type: IV induction LMA: LMA inserted LMA Size: 4.0 Number of attempts: 1 Placement Confirmation: positive ETCO2 and breath sounds checked- equal and bilateral Tube secured with: Tape Dental Injury: Teeth and Oropharynx as per pre-operative assessment

## 2020-03-11 NOTE — Anesthesia Postprocedure Evaluation (Signed)
Anesthesia Post Note  Patient: Stephanie Torres  Procedure(s) Performed: CYSTOSCOPY WITH RIGHT RETROGRADE PYELOGRAM, RIGHT URETEROSCOPY AND RIGHT STENT EXCHANGE (Right ) HOLMIUM LASER APPLICATION  Patient location during evaluation: Short Stay Anesthesia Type: General Level of consciousness: awake and alert and patient cooperative Pain management: satisfactory to patient Vital Signs Assessment: post-procedure vital signs reviewed and stable Respiratory status: spontaneous breathing Cardiovascular status: stable Postop Assessment: no apparent nausea or vomiting Anesthetic complications: no   No complications documented.   Last Vitals:  Vitals:   03/11/20 1345 03/11/20 1400  BP: (!) 138/94 (!) 115/98  Pulse: 94 86  Resp: 20 10  Temp:    SpO2: 92% 92%    Last Pain:  Vitals:   03/11/20 1406  TempSrc:   PainSc: 4                   [place]

## 2020-03-11 NOTE — Discharge Instructions (Signed)
Ureteral Stent Implantation, Care After This sheet gives you information about how to care for yourself after your procedure. Your health care provider may also give you more specific instructions. If you have problems or questions, contact your health care provider. What can I expect after the procedure? After the procedure, it is common to have:  Nausea.  Mild pain when you urinate. You may feel this pain in your lower back or lower abdomen. The pain should stop within a few minutes after you urinate. This may last for up to 1 week.  A small amount of blood in your urine for several days. Follow these instructions at home: Medicines  Take over-the-counter and prescription medicines only as told by your health care provider.  If you were prescribed an antibiotic medicine, take it as told by your health care provider. Do not stop taking the antibiotic even if you start to feel better.  Do not drive for 24 hours if you were given a sedative during your procedure.  Ask your health care provider if the medicine prescribed to you requires you to avoid driving or using heavy machinery. Activity  Rest as told by your health care provider.  Avoid sitting for a long time without moving. Get up to take short walks every 1-2 hours. This is important to improve blood flow and breathing. Ask for help if you feel weak or unsteady.  Return to your normal activities as told by your health care provider. Ask your health care provider what activities are safe for you. General instructions   Watch for any blood in your urine. Call your health care provider if the amount of blood in your urine increases.  If you have a catheter: ? Follow instructions from your health care provider about taking care of your catheter and collection bag. ? Do not take baths, swim, or use a hot tub until your health care provider approves. Ask your health care provider if you may take showers. You may only be allowed to  take sponge baths.  Drink enough fluid to keep your urine pale yellow.  Do not use any products that contain nicotine or tobacco, such as cigarettes, e-cigarettes, and chewing tobacco. These can delay healing after surgery. If you need help quitting, ask your health care provider.  Keep all follow-up visits as told by your health care provider. This is important. Contact a health care provider if:  You have pain that gets worse or does not get better with medicine, especially pain when you urinate.  You have difficulty urinating.  You feel nauseous or you vomit repeatedly during a period of more than 2 days after the procedure. Get help right away if:  Your urine is dark red or has blood clots in it.  You are leaking urine (have incontinence).  The end of the stent comes out of your urethra.  You cannot urinate.  You have sudden, sharp, or severe pain in your abdomen or lower back.  You have a fever.  You have swelling or pain in your legs.  You have difficulty breathing. Summary  After the procedure, it is common to have mild pain when you urinate that goes away within a few minutes after you urinate. This may last for up to 1 week.  Watch for any blood in your urine. Call your health care provider if the amount of blood in your urine increases.  Take over-the-counter and prescription medicines only as told by your health care provider.  Drink   enough fluid to keep your urine pale yellow. This information is not intended to replace advice given to you by your health care provider. Make sure you discuss any questions you have with your health care provider. Document Revised: 02/26/2018 Document Reviewed: 02/27/2018 Elsevier Patient Education  2020 [employer]  PLEASE REMOVE YOUR STENT IN 72 HOURS BY GENTLY PULLING THE STRING  

## 2020-03-11 NOTE — Anesthesia Preprocedure Evaluation (Signed)
Anesthesia Evaluation  Patient identified by MRN, date of birth, ID band Patient awake    Reviewed: Allergy & Precautions, H&P , NPO status , Patient's Chart, lab work & pertinent test results, reviewed documented beta blocker date and time   Airway Mallampati: II  TM Distance: >3 FB Neck ROM: full    Dental no notable dental hx.    Pulmonary asthma ,    Pulmonary exam normal breath sounds clear to auscultation       Cardiovascular Exercise Tolerance: Good negative cardio ROS   Rhythm:regular Rate:Normal     Neuro/Psych  Headaches, PSYCHIATRIC DISORDERS Anxiety Depression    GI/Hepatic negative GI ROS, Neg liver ROS,   Endo/Other  Morbid obesity  Renal/GU negative Renal ROS  negative genitourinary   Musculoskeletal   Abdominal   Peds  Hematology  (+) Blood dyscrasia, anemia ,   Anesthesia Other Findings   Reproductive/Obstetrics negative OB ROS                             Anesthesia Physical Anesthesia Plan  ASA: III  Anesthesia Plan: General   Post-op Pain Management:    Induction:   PONV Risk Score and Plan: Ondansetron  Airway Management Planned:   Additional Equipment:   Intra-op Plan:   Post-operative Plan:   Informed Consent: I have reviewed the patients History and Physical, chart, labs and discussed the procedure including the risks, benefits and alternatives for the proposed anesthesia with the patient or authorized representative who has indicated his/her understanding and acceptance.     Dental Advisory Given  Plan Discussed with: CRNA  Anesthesia Plan Comments:         Anesthesia Quick Evaluation

## 2020-03-11 NOTE — Op Note (Signed)
.  Preoperative diagnosis: right renal stone  Postoperative diagnosis: Same  Procedure: 1 cystoscopy 2. Right retrograde pyelography 3.  Intraoperative fluoroscopy, under one hour, with interpretation 4.  Right ureteroscopic stone manipulation with laser lithotripsy 5.  Right 6 x 26 JJ stent exchange  Attending: Rosie Fate  Anesthesia: General  Estimated blood loss: None  Drains: Right 6 x 26 JJ ureteral stent with tether  Specimens: stone for analysis  Antibiotics: ancef  Findings: right lower pole stone. No hydronephrosis. No masses/lesions in the bladder. Ureteral orifices in normal anatomic location.  Indications: Patient is a 24 year old female with a history of right renal stone and who has persistent right flank pain.  After discussing treatment options, she decided proceed with right ureteroscopic stone manipulation.  Procedure her in detail: The patient was brought to the operating room and a brief timeout was done to ensure correct patient, correct procedure, correct site.  General anesthesia was administered patient was placed in dorsal lithotomy position.  Her genitalia was then prepped and draped in usual sterile fashion.  A rigid 28 French cystoscope was passed in the urethra and the bladder.  Bladder was inspected free masses or lesions.  the ureteral orifices were in the normal orthotopic locations. Using a grasper the right ureteral stent was brought to the urethral meatus. A zipwire was advanced through the stent and up to the renal pelvis. The stent was then removed.  a 6 french ureteral catheter was then instilled into the left ureteral orifice.  a gentle retrograde was obtained and findings noted above.  we then placed a sensor wire through the ureteral catheter and advanced up to the renal pelvis. We then advanced am 12/14 x 35cm access sheath up to the renal pelvis. We then used the flexible ureteroscope to perform nephroscopy. We encountered the stone in the  lower pole. Using a 200nm laser fiber the stone was fragmented.  The fragments were then removed with a Ngage basket.    once all stone fragments were removed we then removed the access sheath under direct vision and noted no injury to the ureter. We then placed a 6 x 26 double-j ureteral stent over the original zip wire.  We then removed the wire and good coil was noted in the the renal pelvis under fluoroscopy and the bladder under direct vision. the bladder was then drained and this concluded the procedure which was well tolerated by patient.  Complications: None  Condition: Stable, extubated, transferred to PACU  Plan: Patient is to be discharged home as to follow-up in one week. She is to remove her stent by pulling the tether in 72 hours

## 2020-03-11 NOTE — Transfer of Care (Signed)
Immediate Anesthesia Transfer of Care Note  Patient: Stephanie Torres  Procedure(s) Performed: CYSTOSCOPY WITH RIGHT RETROGRADE PYELOGRAM, RIGHT URETEROSCOPY AND RIGHT STENT EXCHANGE (Right ) HOLMIUM LASER APPLICATION  Patient Location: PACU  Anesthesia Type:General  Level of Consciousness: awake and patient cooperative  Airway & Oxygen Therapy: Patient Spontanous Breathing  Post-op Assessment: Report given to RN and Post -op Vital signs reviewed and stable  Post vital signs: Reviewed and stable  Last Vitals:  Vitals Value Taken Time  BP 159/109 03/11/20 1315  Temp 97.5   Pulse 103 03/11/20 1317  Resp 16 03/11/20 1317  SpO2 94 % 03/11/20 1317  Vitals shown include unvalidated device data.  Last Pain:  Vitals:   03/11/20 1038  TempSrc: Oral  PainSc: 0-No pain      Patients Stated Pain Goal: 5 (15/86/82 5749)  Complications: No complications documented.

## 2020-03-11 NOTE — Interval H&P Note (Signed)
History and Physical Interval Note:  03/11/2020 11:56 AM  Stephanie Torres  has presented today for surgery, with the diagnosis of right renal calculus.  The various methods of treatment have been discussed with the patient and family. After consideration of risks, benefits and other options for treatment, the patient has consented to  Procedure(s): CYSTOSCOPY WITH RIGHT RETROGRADE PYELOGRAM, RIGHT URETEROSCOPY AND RIGHT STENT EXCHANGE (Right) HOLMIUM LASER APPLICATION as a surgical intervention.  The patient's history has been reviewed, patient examined, no change in status, stable for surgery.  I have reviewed the patient's chart and labs.  Questions were answered to the patient's satisfaction.     Nicolette Bang

## 2020-03-12 ENCOUNTER — Encounter (HOSPITAL_COMMUNITY): Payer: Self-pay | Admitting: Urology

## 2020-03-16 ENCOUNTER — Telehealth: Payer: Self-pay

## 2020-03-16 ENCOUNTER — Other Ambulatory Visit: Payer: Self-pay | Admitting: *Deleted

## 2020-03-16 ENCOUNTER — Other Ambulatory Visit (INDEPENDENT_AMBULATORY_CARE_PROVIDER_SITE_OTHER): Payer: Medicaid Other | Admitting: *Deleted

## 2020-03-16 DIAGNOSIS — Z3042 Encounter for surveillance of injectable contraceptive: Secondary | ICD-10-CM | POA: Diagnosis not present

## 2020-03-16 DIAGNOSIS — Z308 Encounter for other contraceptive management: Secondary | ICD-10-CM

## 2020-03-16 MED ORDER — MEDROXYPROGESTERONE ACETATE 150 MG/ML IM SUSP
150.0000 mg | Freq: Once | INTRAMUSCULAR | Status: AC
  Administered 2020-03-16: 150 mg via INTRAMUSCULAR

## 2020-03-16 MED ORDER — MEDROXYPROGESTERONE ACETATE 150 MG/ML IM SUSP: 150.0000 mg | INTRAMUSCULAR | 0 refills | Status: DC

## 2020-03-16 NOTE — Telephone Encounter (Signed)
Please advise 

## 2020-03-16 NOTE — Telephone Encounter (Signed)
Medication sent to pharmacy and pt is aware 

## 2020-03-16 NOTE — Telephone Encounter (Signed)
Please go ahead with sending in prescription

## 2020-03-16 NOTE — Telephone Encounter (Signed)
Pt is scheduled for depo at 3:00 today and needs shot called in to [facility] in [place] prior to her appt. (Has Medicaid)

## 2020-03-17 ENCOUNTER — Ambulatory Visit: Payer: Medicaid Other | Admitting: Urology

## 2020-03-17 DIAGNOSIS — N200 Calculus of kidney: Secondary | ICD-10-CM

## 2020-03-17 LAB — CALCULI, WITH PHOTOGRAPH (CLINICAL LAB)
Calcium Oxalate Dihydrate: 30 %
Calcium Oxalate Monohydrate: 65 %
Hydroxyapatite: 5 %
Weight Calculi: 22 mg

## 2020-03-31 ENCOUNTER — Ambulatory Visit: Payer: Medicaid Other | Admitting: Urology

## 2020-04-08 ENCOUNTER — Encounter: Payer: Self-pay | Admitting: Family Medicine

## 2020-04-08 ENCOUNTER — Other Ambulatory Visit: Payer: Self-pay

## 2020-04-08 ENCOUNTER — Telehealth (INDEPENDENT_AMBULATORY_CARE_PROVIDER_SITE_OTHER): Payer: Medicaid Other | Admitting: Family Medicine

## 2020-04-08 DIAGNOSIS — J4521 Mild intermittent asthma with (acute) exacerbation: Secondary | ICD-10-CM | POA: Diagnosis not present

## 2020-04-08 MED ORDER — PREDNISONE 20 MG PO TABS: 40.0000 mg | ORAL_TABLET | Freq: Every day | ORAL | 0 refills | Status: DC

## 2020-04-08 MED ORDER — BUDESONIDE-FORMOTEROL FUMARATE 80-4.5 MCG/ACT IN AERO: 2.0000 | INHALATION_SPRAY | Freq: Two times a day (BID) | RESPIRATORY_TRACT | 2 refills | Status: DC

## 2020-04-08 NOTE — Progress Notes (Signed)
Patient ID: Stephanie Torres, female    DOB: December 17, 1995, 24 y.o.   MRN: [id number]   Chief Complaint  Patient presents with  . Cough   Subjective:   HPI- Phone visit Pt with - sore throat started it and hard time breathing.  Hard to catch breath. Pt is currently at work.  Back on left side hurting and  Feeling inhaler and neb not working as well as usual.   Wheezing at work today. Took at 2am breathing neb. 4am inhaler and at 7am.  Coughing-mostly dry.  For past 3 days. Deep coughing. Greenish yellow color occasionally.  Going on for over 1 wk. No fever.   No sinus pain or ear pain. occ headache.  Not had covid vaccines. Not known covid exposure. Has in history taken symbicort, not currently taking it.  Cough This is a new problem. Episode onset: 1 week. The cough is productive of sputum. Associated symptoms include nasal congestion, a sore throat (resolved), shortness of breath and wheezing. Pertinent negatives include no chills, fever, postnasal drip or rhinorrhea. She has tried OTC cough suppressant and steroid inhaler (robitussim, cough lozenges ) for the symptoms. The treatment provided mild relief. Her past medical history is significant for asthma.    Virtual Visit via Video Note  I connected with Stephanie Torres on 04/08/20 at 10:40 AM EDT by a video enabled telemedicine application and verified that I am speaking with the correct person using two identifiers.  Location: Patient: home Provider: office   I discussed the limitations of evaluation and management by telemedicine and the availability of in person appointments. The patient expressed understanding and agreed to proceed.  Medical History Jakaya has a past medical history of Anxiety, Asthma, Depression, DUB (dysfunctional uterine bleeding), Heartburn, and PCOS (polycystic ovarian syndrome).   Outpatient Encounter Medications as of 04/08/2020  Medication Sig  . albuterol (PROVENTIL) (2.5 MG/3ML) 0.083%  nebulizer solution Take 3 mLs (2.5 mg total) by nebulization every 6 (six) hours as needed for wheezing or shortness of breath.  Marland Kitchen albuterol (VENTOLIN HFA) 108 (90 Base) MCG/ACT inhaler Inhale 2 puffs into the lungs every 6 (six) hours as needed for wheezing or shortness of breath.  . budesonide-formoterol (SYMBICORT) 80-4.5 MCG/ACT inhaler Inhale 2 puffs into the lungs 2 (two) times daily.  . citalopram (CELEXA) 20 MG tablet Take 1 tablet (20 mg total) by mouth daily.  . clonazePAM (KLONOPIN) 0.5 MG tablet Take 1/2-1 tab po BID Prn severe anxiety. Do not take with pain medications. (Patient taking differently: Take 0.25-0.5 mg by mouth 2 (two) times daily as needed for anxiety. Take 1/2-1 tab po BID Prn severe anxiety. Do not take with pain medications.)  . Homeopathic Products (AZO YEAST PLUS PO) Take 2 tablets by mouth in the morning, at noon, and at bedtime.  Marland Kitchen ibuprofen (ADVIL) 600 MG tablet Take 1 tablet (600 mg total) by mouth every 6 (six) hours as needed. (Patient taking differently: Take 600 mg by mouth every 6 (six) hours as needed (for pain.). )  . medroxyPROGESTERone (DEPO-PROVERA) 150 MG/ML injection Inject 1 mL (150 mg total) into the muscle every 3 (three) months.  Marland Kitchen oxyCODONE-acetaminophen (PERCOCET) 5-325 MG tablet Take 1 tablet by mouth every 4 (four) hours as needed for severe pain. (Patient not taking: Reported on 04/08/2020)  . predniSONE (DELTASONE) 20 MG tablet Take 2 tablets (40 mg total) by mouth daily with breakfast.  . tamsulosin (FLOMAX) 0.4 MG CAPS capsule Take 1 capsule (0.4 mg total)  by mouth daily after supper. (Patient not taking: Reported on 04/08/2020)  . [DISCONTINUED] budesonide-formoterol (SYMBICORT) 80-4.5 MCG/ACT inhaler Inhale 2 puffs into the lungs 2 (two) times daily. (Patient taking differently: Inhale 2 puffs into the lungs 2 (two) times daily as needed (respiratory issues (typically during Winter months)). )  . [DISCONTINUED] cefUROXime (CEFTIN) 500 MG tablet  Take 1 tablet (500 mg total) by mouth 2 (two) times daily with a meal.  . [DISCONTINUED] ondansetron (ZOFRAN-ODT) 4 MG disintegrating tablet Take 1 tablet (4 mg total) by mouth every 8 (eight) hours as needed for nausea or vomiting.   No facility-administered encounter medications on file as of 04/08/2020.     Review of Systems  Constitutional: Negative for chills and fever.  HENT: Positive for sore throat (resolved). Negative for congestion, postnasal drip, rhinorrhea, sinus pressure and sinus pain.   Respiratory: Positive for cough, shortness of breath and wheezing.   Gastrointestinal: Negative for diarrhea, nausea and vomiting.     Vitals There were no vitals taken for this visit.  Objective:   Physical Exam  No PE due to phone visit.  Assessment and Plan   1. Mild intermittent asthma with acute exacerbation - predniSONE (DELTASONE) 20 MG tablet; Take 2 tablets (40 mg total) by mouth daily with breakfast.  Dispense: 10 tablet; Refill: 0 - budesonide-formoterol (SYMBICORT) 80-4.5 MCG/ACT inhaler; Inhale 2 puffs into the lungs 2 (two) times daily.  Dispense: 6.9 g; Refill: 2    Pt given medications to help with asthma exacerbation. Not felt this is bacterial at this time. Pt to use rescue inahler q2-4 hrs prn, neb q6hrs.  And symbicort bid. Also gave prednisone for 5 days.  Pt offered appt tomorrow for recheck, pt can't come in, but can come on Monday, 4 days from now.  Will set up appt for recheck. advised if not improving or worsening sob, fever, or coughing over weekend needing to go to urgent care or ER.  Pt voiced understanding.  F/u 4 days.  Follow Up Instructions:    I discussed the assessment and treatment plan with the patient. The patient was provided an opportunity to ask questions and all were answered. The patient agreed with the plan and demonstrated an understanding of the instructions.   The patient was advised to call back or seek an in-person evaluation  if the symptoms worsen or if the condition fails to improve as anticipated.  I provided 13 minutes of non-face-to-face time during this encounter.

## 2020-04-12 ENCOUNTER — Encounter: Payer: Self-pay | Admitting: Family Medicine

## 2020-04-12 ENCOUNTER — Other Ambulatory Visit: Payer: Self-pay

## 2020-04-12 ENCOUNTER — Ambulatory Visit (INDEPENDENT_AMBULATORY_CARE_PROVIDER_SITE_OTHER): Payer: Medicaid Other | Admitting: Family Medicine

## 2020-04-12 ENCOUNTER — Ambulatory Visit: Payer: Medicaid Other | Admitting: Family Medicine

## 2020-04-12 VITALS — BP 128/82 | HR 115 | Temp 97.3°F | Wt 270.2 lb

## 2020-04-12 DIAGNOSIS — J329 Chronic sinusitis, unspecified: Secondary | ICD-10-CM

## 2020-04-12 DIAGNOSIS — J310 Chronic rhinitis: Secondary | ICD-10-CM

## 2020-04-12 MED ORDER — AMOXICILLIN-POT CLAVULANATE 875-125 MG PO TABS: 1.0000 | ORAL_TABLET | Freq: Two times a day (BID) | ORAL | 0 refills | Status: DC

## 2020-04-12 NOTE — Patient Instructions (Signed)
Sinusitis, Adult Sinusitis is inflammation of your sinuses. Sinuses are hollow spaces in the bones around your face. Your sinuses are located:  Around your eyes.  In the middle of your forehead.  Behind your nose.  In your cheekbones. Mucus normally drains out of your sinuses. When your nasal tissues become inflamed or swollen, mucus can become trapped or blocked. This allows bacteria, viruses, and fungi to grow, which leads to infection. Most infections of the sinuses are caused by a virus. Sinusitis can develop quickly. It can last for up to 4 weeks (acute) or for more than 12 weeks (chronic). Sinusitis often develops after a cold. What are the causes? This condition is caused by anything that creates swelling in the sinuses or stops mucus from draining. This includes:  Allergies.  Asthma.  Infection from bacteria or viruses.  Deformities or blockages in your nose or sinuses.  Abnormal growths in the nose (nasal polyps).  Pollutants, such as chemicals or irritants in the air.  Infection from fungi (rare). What increases the risk? You are more likely to develop this condition if you:  Have a weak body defense system (immune system).  Do a lot of swimming or diving.  Overuse nasal sprays.  Smoke. What are the signs or symptoms? The main symptoms of this condition are pain and a feeling of pressure around the affected sinuses. Other symptoms include:  Stuffy nose or congestion.  Thick drainage from your nose.  Swelling and warmth over the affected sinuses.  Headache.  Upper toothache.  A cough that may get worse at night.  Extra mucus that collects in the throat or the back of the nose (postnasal drip).  Decreased sense of smell and taste.  Fatigue.  A fever.  Sore throat.  Bad breath. How is this diagnosed? This condition is diagnosed based on:  Your symptoms.  Your medical history.  A physical exam.  Tests to find out if your condition is  acute or chronic. This may include: ? Checking your nose for nasal polyps. ? Viewing your sinuses using a device that has a light (endoscope). ? Testing for allergies or bacteria. ? Imaging tests, such as an MRI or CT scan. In rare cases, a bone biopsy may be done to rule out more serious types of fungal sinus disease. How is this treated? Treatment for sinusitis depends on the cause and whether your condition is chronic or acute.  If caused by a virus, your symptoms should go away on their own within 10 days. You may be given medicines to relieve symptoms. They include: ? Medicines that shrink swollen nasal passages (topical intranasal decongestants). ? Medicines that treat allergies (antihistamines). ? A spray that eases inflammation of the nostrils (topical intranasal corticosteroids). ? Rinses that help get rid of thick mucus in your nose (nasal saline washes).  If caused by bacteria, your health care provider may recommend waiting to see if your symptoms improve. Most bacterial infections will get better without antibiotic medicine. You may be given antibiotics if you have: ? A severe infection. ? A weak immune system.  If caused by narrow nasal passages or nasal polyps, you may need to have surgery. Follow these instructions at home: Medicines  Take, use, or apply over-the-counter and prescription medicines only as told by your health care provider. These may include nasal sprays.  If you were prescribed an antibiotic medicine, take it as told by your health care provider. Do not stop taking the antibiotic even if you start   to feel better. Hydrate and humidify   Drink enough fluid to keep your urine pale yellow. Staying hydrated will help to thin your mucus.  Use a cool mist humidifier to keep the humidity level in your home above 50%.  Inhale steam for 10-15 minutes, 3-4 times a day, or as told by your health care provider. You can do this in the bathroom while a hot shower is  running.  Limit your exposure to cool or dry air. Rest  Rest as much as possible.  Sleep with your head raised (elevated).  Make sure you get enough sleep each night. General instructions   Apply a warm, moist washcloth to your face 3-4 times a day or as told by your health care provider. This will help with discomfort.  Wash your hands often with soap and water to reduce your exposure to germs. If soap and water are not available, use hand sanitizer.  Do not smoke. Avoid being around people who are smoking (secondhand smoke).  Keep all follow-up visits as told by your health care provider. This is important. Contact a health care provider if:  You have a fever.  Your symptoms get worse.  Your symptoms do not improve within 10 days. Get help right away if:  You have a severe headache.  You have persistent vomiting.  You have severe pain or swelling around your face or eyes.  You have vision problems.  You develop confusion.  Your neck is stiff.  You have trouble breathing. Summary  Sinusitis is soreness and inflammation of your sinuses. Sinuses are hollow spaces in the bones around your face.  This condition is caused by nasal tissues that become inflamed or swollen. The swelling traps or blocks the flow of mucus. This allows bacteria, viruses, and fungi to grow, which leads to infection.  If you were prescribed an antibiotic medicine, take it as told by your health care provider. Do not stop taking the antibiotic even if you start to feel better.  Keep all follow-up visits as told by your health care provider. This is important. This information is not intended to replace advice given to you by your health care provider. Make sure you discuss any questions you have with your health care provider. Document Revised: 10/22/2017 Document Reviewed: 10/22/2017 Elsevier Patient Education  2020 [employer]  

## 2020-04-12 NOTE — Progress Notes (Signed)
Patient ID: Stephanie Torres, female    DOB: [birth year], 26 y.o.   MRN: [id number]   Chief Complaint  Patient presents with   Asthma    Patient coming in to follow up on asthma exacerbation after starting prednisone and symbicort inhaler. Patient does see improvement but not completely better. Slight sore throat and wheezing.    Subjective:  CC: follow-up from tele health visit for asthma exacerbation  Presents today for a follow-up visit from 11/4 telehealth visit with Dr. Lovena Le. She was having an asthma exacerbation, started on Symbicort and a prednisone taper. She reports that she is doing much better, but she still not 100%. Pertinent negatives include no fever, no chills, no sore throat. Still having a little shortness of breath some congestion and some cough. She is able to converse during her visit without any problems.    Medical History Elizabelle has a past medical history of Anxiety, Asthma, Depression, DUB (dysfunctional uterine bleeding), Heartburn, and PCOS (polycystic ovarian syndrome).   Outpatient Encounter Medications as of 04/12/2020  Medication Sig   albuterol (PROVENTIL) (2.5 MG/3ML) 0.083% nebulizer solution Take 3 mLs (2.5 mg total) by nebulization every 6 (six) hours as needed for wheezing or shortness of breath.   albuterol (VENTOLIN HFA) 108 (90 Base) MCG/ACT inhaler Inhale 2 puffs into the lungs every 6 (six) hours as needed for wheezing or shortness of breath.   amoxicillin-clavulanate (AUGMENTIN) 875-125 MG tablet Take 1 tablet by mouth 2 (two) times daily.   budesonide-formoterol (SYMBICORT) 80-4.5 MCG/ACT inhaler Inhale 2 puffs into the lungs 2 (two) times daily.   citalopram (CELEXA) 20 MG tablet Take 1 tablet (20 mg total) by mouth daily.   clonazePAM (KLONOPIN) 0.5 MG tablet Take 1/2-1 tab po BID Prn severe anxiety. Do not take with pain medications. (Patient taking differently: Take 0.25-0.5 mg by mouth 2 (two) times daily as needed for anxiety. Take  1/2-1 tab po BID Prn severe anxiety. Do not take with pain medications.)   Homeopathic Products (AZO YEAST PLUS PO) Take 2 tablets by mouth in the morning, at noon, and at bedtime.   ibuprofen (ADVIL) 600 MG tablet Take 1 tablet (600 mg total) by mouth every 6 (six) hours as needed. (Patient taking differently: Take 600 mg by mouth every 6 (six) hours as needed (for pain.). )   medroxyPROGESTERone (DEPO-PROVERA) 150 MG/ML injection Inject 1 mL (150 mg total) into the muscle every 3 (three) months.   predniSONE (DELTASONE) 20 MG tablet Take 2 tablets (40 mg total) by mouth daily with breakfast.   tamsulosin (FLOMAX) 0.4 MG CAPS capsule Take 1 capsule (0.4 mg total) by mouth daily after supper. (Patient not taking: Reported on 04/08/2020)   [DISCONTINUED] oxyCODONE-acetaminophen (PERCOCET) 5-325 MG tablet Take 1 tablet by mouth every 4 (four) hours as needed for severe pain. (Patient not taking: Reported on 04/08/2020)   No facility-administered encounter medications on file as of 04/12/2020.     Review of Systems  Constitutional: Negative for chills and fever.  HENT: Positive for congestion, postnasal drip, rhinorrhea, sinus pressure, sinus pain and sore throat.   Respiratory: Positive for shortness of breath.   Cardiovascular: Negative for chest pain.  Gastrointestinal: Negative for abdominal pain.  Genitourinary: Negative for dysuria.     Vitals BP 128/82    Pulse (!) 115    Temp (!) 97.3 F (36.3 C)    Wt 270 lb 3.2 oz (122.6 kg)    SpO2 98%    BMI 46.38 kg/m  Objective:   Physical Exam Vitals and nursing note reviewed.  Constitutional:      General: She is not in acute distress.    Appearance: Normal appearance. She is ill-appearing. She is not toxic-appearing.  HENT:     Right Ear: Tympanic membrane normal.     Left Ear: Tympanic membrane normal.     Nose:     Right Sinus: Frontal sinus tenderness present.     Left Sinus: Frontal sinus tenderness present.      Mouth/Throat:     Mouth: Mucous membranes are moist.     Pharynx: Posterior oropharyngeal erythema present.  Cardiovascular:     Rate and Rhythm: Regular rhythm.     Heart sounds: Normal heart sounds.  Pulmonary:     Effort: Pulmonary effort is normal.     Breath sounds: Normal breath sounds.  Skin:    General: Skin is warm and dry.  Neurological:     Mental Status: She is alert and oriented to person, place, and time.  Psychiatric:        Mood and Affect: Mood normal.        Behavior: Behavior normal.        Thought Content: Thought content normal.        Judgment: Judgment normal.      Assessment and Plan   1. Rhinosinusitis - amoxicillin-clavulanate (AUGMENTIN) 875-125 MG tablet; Take 1 tablet by mouth 2 (two) times daily.  Dispense: 20 tablet; Refill: 0   Due to her extreme frontal sinus pain and pressure will treat for rhinosinusitis with amoxicillin. She is much better than on November 4.  Her lungs today sound good, with good air moving throughout, no wheezing. Oxygen saturation 98%.   Agrees with plan of care discussed today. Understands warning signs to seek further care: Fever, chest pain, shortness of breath, any significant changes in health status. Understands to follow-up if symptoms do not improve, or worsen. She will complete her antibiotic course, and complete the prednisone taper given by Dr. Lovena Le. Reports Symbicort has worked very well. She will let us know if anything changes.  Pecolia Ades, FNP-C

## 2020-05-04 ENCOUNTER — Other Ambulatory Visit: Payer: Self-pay

## 2020-05-04 ENCOUNTER — Encounter: Payer: Self-pay | Admitting: Family Medicine

## 2020-05-04 ENCOUNTER — Ambulatory Visit (HOSPITAL_COMMUNITY)
Admission: RE | Admit: 2020-05-04 | Discharge: 2020-05-04 | Disposition: A | Discharge status: Home / Self Care | Payer: Medicaid Other | Source: Ambulatory Visit | Attending: Family Medicine | Admitting: Family Medicine

## 2020-05-04 ENCOUNTER — Ambulatory Visit (INDEPENDENT_AMBULATORY_CARE_PROVIDER_SITE_OTHER): Payer: Medicaid Other | Admitting: Family Medicine

## 2020-05-04 VITALS — HR 107 | Temp 98.5°F | Resp 18

## 2020-05-04 DIAGNOSIS — B9689 Other specified bacterial agents as the cause of diseases classified elsewhere: Secondary | ICD-10-CM

## 2020-05-04 DIAGNOSIS — J019 Acute sinusitis, unspecified: Secondary | ICD-10-CM | POA: Diagnosis not present

## 2020-05-04 DIAGNOSIS — R059 Cough, unspecified: Secondary | ICD-10-CM | POA: Diagnosis not present

## 2020-05-04 DIAGNOSIS — J029 Acute pharyngitis, unspecified: Secondary | ICD-10-CM | POA: Insufficient documentation

## 2020-05-04 LAB — POCT RAPID STREP A (OFFICE): Rapid Strep A Screen: NEGATIVE

## 2020-05-04 MED ORDER — AMOXICILLIN-POT CLAVULANATE 875-125 MG PO TABS: 1.0000 | ORAL_TABLET | Freq: Two times a day (BID) | ORAL | 0 refills | Status: DC

## 2020-05-04 MED ORDER — BENZONATATE 100 MG PO CAPS: 100.0000 mg | ORAL_CAPSULE | Freq: Two times a day (BID) | ORAL | 0 refills | Status: DC | PRN

## 2020-05-04 NOTE — Progress Notes (Signed)
Patient ID: Stephanie Torres, female    DOB: [birth year], 24 y.o.   MRN: [id number]   Chief Complaint  Patient presents with  . Sinusitis   Subjective:  CC: sore throat, cough, congestion, headache, horseness  This is a new problem. Presents today with a complaint of sore throat, cough, congestion, headache, hoarseness, and chills. "Feels bad. Onset of symptoms was 3 days ago. She was last seen on November 8 that diagnosed with rhinosinusitis treated with Augmentin finished the course, says she felt a little better after that and now it is back. Has tried Robitussin, honey, Mucinex, and cough drops. Denies fever denies wheezing says that she feels like she is "forgetful "reports chest pain and pressure with coughing and this morning coughed up something that looked like "paper "it was steak and odd color green. Also reports chest tightness. Unfortunately, her employer is unable to allow her to be off work, so she has been trying to work through this illness.   having sore throat, cough and congestion. Started about one month. Starting to feel dizzy and having headache. Finished augmentin. Tried robitussin, honey, mucinex, and cough drops.    Medical History Stephanie Torres has a past medical history of Anxiety, Asthma, Depression, DUB (dysfunctional uterine bleeding), Heartburn, and PCOS (polycystic ovarian syndrome).   Outpatient Encounter Medications as of 05/04/2020  Medication Sig  . albuterol (PROVENTIL) (2.5 MG/3ML) 0.083% nebulizer solution Take 3 mLs (2.5 mg total) by nebulization every 6 (six) hours as needed for wheezing or shortness of breath.  Marland Kitchen albuterol (VENTOLIN HFA) 108 (90 Base) MCG/ACT inhaler Inhale 2 puffs into the lungs every 6 (six) hours as needed for wheezing or shortness of breath.  . budesonide-formoterol (SYMBICORT) 80-4.5 MCG/ACT inhaler Inhale 2 puffs into the lungs 2 (two) times daily.  . citalopram (CELEXA) 20 MG tablet Take 1 tablet (20 mg total) by mouth daily.  .  clonazePAM (KLONOPIN) 0.5 MG tablet Take 1/2-1 tab po BID Prn severe anxiety. Do not take with pain medications. (Patient taking differently: Take 0.25-0.5 mg by mouth 2 (two) times daily as needed for anxiety. Take 1/2-1 tab po BID Prn severe anxiety. Do not take with pain medications.)  . medroxyPROGESTERone (DEPO-PROVERA) 150 MG/ML injection Inject 1 mL (150 mg total) into the muscle every 3 (three) months.  Marland Kitchen amoxicillin-clavulanate (AUGMENTIN) 875-125 MG tablet Take 1 tablet by mouth 2 (two) times daily.  . benzonatate (TESSALON) 100 MG capsule Take 1 capsule (100 mg total) by mouth 2 (two) times daily as needed for cough.  . [DISCONTINUED] amoxicillin-clavulanate (AUGMENTIN) 875-125 MG tablet Take 1 tablet by mouth 2 (two) times daily.  . [DISCONTINUED] Homeopathic Products (AZO YEAST PLUS PO) Take 2 tablets by mouth in the morning, at noon, and at bedtime.  . [DISCONTINUED] ibuprofen (ADVIL) 600 MG tablet Take 1 tablet (600 mg total) by mouth every 6 (six) hours as needed. (Patient taking differently: Take 600 mg by mouth every 6 (six) hours as needed (for pain.). )  . [DISCONTINUED] predniSONE (DELTASONE) 20 MG tablet Take 2 tablets (40 mg total) by mouth daily with breakfast.  . [DISCONTINUED] tamsulosin (FLOMAX) 0.4 MG CAPS capsule Take 1 capsule (0.4 mg total) by mouth daily after supper. (Patient not taking: Reported on 04/08/2020)   No facility-administered encounter medications on file as of 05/04/2020.     Review of Systems  Constitutional: Positive for chills. Negative for fever.  HENT: Positive for congestion, rhinorrhea and sore throat. Negative for sinus pressure and sinus pain.  Respiratory: Positive for cough and chest tightness. Negative for shortness of breath.   Cardiovascular: Positive for chest pain.  Hematological: Positive for adenopathy.     Vitals Pulse (!) 107   Temp 98.5 F (36.9 C)   Resp 18   SpO2 98%   Objective:   Physical Exam Vitals and nursing  note reviewed.  Constitutional:      General: She is not in acute distress.    Appearance: She is ill-appearing. She is not toxic-appearing.  HENT:     Right Ear: Tympanic membrane normal.     Left Ear: Tenderness present. Tympanic membrane is erythematous.     Nose: Congestion and rhinorrhea present.     Right Sinus: Maxillary sinus tenderness and frontal sinus tenderness present.     Left Sinus: Maxillary sinus tenderness and frontal sinus tenderness present.     Mouth/Throat:     Pharynx: Posterior oropharyngeal erythema present.  Cardiovascular:     Rate and Rhythm: Regular rhythm.     Heart sounds: Normal heart sounds.  Pulmonary:     Effort: Pulmonary effort is normal.     Comments: Breath sounds diminished on the right, otherwise clear. Lymphadenopathy:     Cervical: Cervical adenopathy present.  Skin:    General: Skin is warm and dry.  Neurological:     Mental Status: She is alert and oriented to person, place, and time.  Psychiatric:        Mood and Affect: Mood normal.        Behavior: Behavior normal.    Results for orders placed or performed in visit on 05/04/20  POCT rapid strep A  Result Value Ref Range   Rapid Strep A Screen Negative Negative     Assessment and Plan   1. Cough - DG Chest 2 View - Novel Coronavirus, NAA ([facility]) - benzonatate (TESSALON) 100 MG capsule; Take 1 capsule (100 mg total) by mouth 2 (two) times daily as needed for cough.  Dispense: 20 capsule; Refill: 0  2. Sore throat - POCT rapid strep A - Strep A DNA probe - Novel Coronavirus, NAA ([facility])  3. Acute bacterial rhinosinusitis - amoxicillin-clavulanate (AUGMENTIN) 875-125 MG tablet; Take 1 tablet by mouth 2 (two) times daily.  Dispense: 20 tablet; Refill: 0   Concerned for the chest pain and pressure with coughing and the report of coughing up "paperlike "mucus. Will send for stat chest x-ray at  [facility] now, she will need to report back to work as soon as possible, will notify  her later if the x-ray is positive for pneumonia, she likely also has a recurrent sinusitis, will treat with Augmentin.  Her rapid strep was negative, will send for culture.   I encouraged her that she needed to take some time off from work to try to recover, this does not seem possible for her right now, work note given explaining that she was seen today for an illness. She said she will go to [facility] right away for the chest x-ray.  Agrees with plan of care discussed today. Understands warning signs to seek further care: Chest pain, shortness of breath, fever, any significant change in health status. Try to explain that if she does not take care of herself she will possibly end of hospitalized and this will force her to be out of work. Understands to follow-up if symptoms do not improve, or worsen.  Update: chest x-ray negative for pneumonia. 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 I informed her if she had pneumonia I would call her while she was at work. She will start the antibiotic for her recurrent sinus infection tonight after work.    Pecolia Ades, FNP-C 05/04/2020

## 2020-05-06 LAB — SPECIMEN STATUS REPORT

## 2020-05-06 LAB — NOVEL CORONAVIRUS, NAA: SARS-CoV-2, NAA: NOT DETECTED

## 2020-05-06 LAB — STREP A DNA PROBE: Strep Gp A Direct, DNA Probe: NEGATIVE

## 2020-05-06 LAB — SARS-COV-2, NAA 2 DAY TAT

## 2020-06-08 ENCOUNTER — Emergency Department (HOSPITAL_COMMUNITY)
Admission: EM | Admit: 2020-06-08 | Discharge: 2020-06-08 | Disposition: A | Discharge status: Home / Self Care | Payer: Medicaid Other | Attending: Emergency Medicine | Admitting: Emergency Medicine

## 2020-06-08 ENCOUNTER — Encounter (HOSPITAL_COMMUNITY): Payer: Self-pay | Admitting: Emergency Medicine

## 2020-06-08 ENCOUNTER — Other Ambulatory Visit: Payer: Self-pay

## 2020-06-08 DIAGNOSIS — J45909 Unspecified asthma, uncomplicated: Secondary | ICD-10-CM | POA: Insufficient documentation

## 2020-06-08 DIAGNOSIS — U071 COVID-19: Secondary | ICD-10-CM | POA: Diagnosis not present

## 2020-06-08 DIAGNOSIS — R509 Fever, unspecified: Secondary | ICD-10-CM | POA: Diagnosis present

## 2020-06-08 LAB — POC SARS CORONAVIRUS 2 AG -  ED: SARS Coronavirus 2 Ag: POSITIVE — AB

## 2020-06-08 MED ORDER — METHYLPREDNISOLONE 4 MG PO TBPK: ORAL_TABLET | ORAL | 0 refills | Status: DC

## 2020-06-08 NOTE — ED Notes (Addendum)
Entered room and introduced self to patient. Pt appears to be resting in bed, respirations are even and unlabored with equal chest rise and fall.  Pt educated on call light use and hourly rounding, verbalized understanding and in agreement at this time. All questions and concerns voiced addressed. Refreshments offered and provided per patient request.  Will continue to monitor.

## 2020-06-08 NOTE — ED Notes (Signed)
PA at bedside at this time.  

## 2020-06-08 NOTE — Discharge Instructions (Signed)
Person Under Monitoring Name: Stephanie Torres  Location: [address] [place] [address]   Infection Prevention Recommendations for Individuals Confirmed to have, or Being Evaluated for, 2019 Novel Coronavirus (COVID-19) Infection Who Receive Care at Home  Individuals who are confirmed to have, or are being evaluated for, COVID-19 should follow the prevention steps below until a healthcare provider or local or state health department says they can return to normal activities.  Stay home except to get medical care You should restrict activities outside your home, except for getting medical care. Do not go to work, school, or public areas, and do not use public transportation or taxis.  Call ahead before visiting your doctor Before your medical appointment, call the healthcare provider and tell them that you have, or are being evaluated for, COVID-19 infection. This will help the healthcare providers office take steps to keep other people from getting infected. Ask your healthcare provider to call the local or state health department.  Monitor your symptoms Seek prompt medical attention if your illness is worsening (e.g., difficulty breathing). Before going to your medical appointment, call the healthcare provider and tell them that you have, or are being evaluated for, COVID-19 infection. Ask your healthcare provider to call the local or state health department.  Wear a facemask You should wear a facemask that covers your nose and mouth when you are in the same room with other people and when you visit a healthcare provider. People who live with or visit you should also wear a facemask while they are in the same room with you.  Separate yourself from other people in your home As much as possible, you should stay in a different room from other people in your home. Also, you should use a separate bathroom, if available.  Avoid sharing household items You should not share  dishes, drinking glasses, cups, eating utensils, towels, bedding, or other items with other people in your home. After using these items, you should wash them thoroughly with soap and water.  Cover your coughs and sneezes Cover your mouth and nose with a tissue when you cough or sneeze, or you can cough or sneeze into your sleeve. Throw used tissues in a lined trash can, and immediately wash your hands with soap and water for at least 20 seconds or use an alcohol-based hand rub.  Wash your [employer] your hands often and thoroughly with soap and water for at least 20 seconds. You can use an alcohol-based hand sanitizer if soap and water are not available and if your hands are not visibly dirty. Avoid touching your eyes, nose, and mouth with unwashed hands.   Prevention Steps for Caregivers and Household Members of Individuals Confirmed to have, or Being Evaluated for, COVID-19 Infection Being Cared for in the Home  If you live with, or provide care at home for, a person confirmed to have, or being evaluated for, COVID-19 infection please follow these guidelines to prevent infection:  Follow healthcare providers instructions Make sure that you understand and can help the patient follow any healthcare provider instructions for all care.  Provide for the patients basic needs You should help the patient with basic needs in the home and provide support for getting groceries, prescriptions, and other personal needs.  Monitor the patients symptoms If they are getting sicker, call his or her medical provider and tell them that the patient has, or is being evaluated for, COVID-19 infection. This will help the healthcare providers office  take steps to keep other people from getting infected. °Ask the healthcare provider to call the local or state health department. ° °Limit the number of people who have contact with the patient °If possible, have only one caregiver for the patient. °Other  household members should stay in another home or place of residence. If this is not possible, they should stay °in another room, or be separated from the patient as much as possible. Use a separate bathroom, if available. °Restrict visitors who do not have an essential need to be in the home. ° °Keep older adults, very young children, and other sick people away from the patient °Keep older adults, very young children, and those who have compromised immune systems or chronic health conditions away from the patient. This includes people with chronic heart, lung, or kidney conditions, diabetes, and cancer. ° °Ensure good ventilation °Make sure that shared spaces in the home have good air flow, such as from an air conditioner or an opened window, °weather permitting. ° °Wash your hands often °Wash your hands often and thoroughly with soap and water for at least 20 seconds. You can use an alcohol based hand sanitizer if soap and water are not available and if your hands are not visibly dirty. °Avoid touching your eyes, nose, and mouth with unwashed hands. °Use disposable paper towels to dry your hands. If not available, use dedicated cloth towels and replace them when they become wet. ° °Wear a facemask and gloves °Wear a disposable facemask at all times in the room and gloves when you touch or have contact with the patient’s blood, body fluids, and/or secretions or excretions, such as sweat, saliva, sputum, nasal mucus, vomit, urine, or feces.  Ensure the mask fits over your nose and mouth tightly, and do not touch it during use. °Throw out disposable facemasks and gloves after using them. Do not reuse. °Wash your hands immediately after removing your facemask and gloves. °If your personal clothing becomes contaminated, carefully remove clothing and launder. Wash your hands after handling contaminated clothing. °Place all used disposable facemasks, gloves, and other waste in a lined container before disposing them with  other household waste. °Remove gloves and wash your hands immediately after handling these items. ° °Do not share dishes, glasses, or other household items with the patient °Avoid sharing household items. You should not share dishes, drinking glasses, cups, eating utensils, towels, bedding, or other items with a patient who is confirmed to have, or being evaluated for, COVID-19 infection. °After the person uses these items, you should wash them thoroughly with soap and water. ° °Wash laundry thoroughly °Immediately remove and wash clothes or bedding that have blood, body fluids, and/or secretions or excretions, such as sweat, saliva, sputum, nasal mucus, vomit, urine, or feces, on them. °Wear gloves when handling laundry from the patient. °Read and follow directions on labels of laundry or clothing items and detergent. In general, wash and dry with the warmest temperatures recommended on the label. ° °Clean all areas the individual has used often °Clean all touchable surfaces, such as counters, tabletops, doorknobs, bathroom fixtures, toilets, phones, keyboards, tablets, and bedside tables, every day. Also, clean any surfaces that may have blood, body fluids, and/or secretions or excretions on them. °Wear gloves when cleaning surfaces the patient has come in contact with. °Use a diluted bleach solution (e.g., dilute bleach with 1 part bleach and 10 parts water) or a household disinfectant with a label that says EPA-registered for coronaviruses. To make a bleach   solution at home, add 1 tablespoon of bleach to 1 quart (4 cups) of water. For a larger supply, add  cup of bleach to 1 gallon (16 cups) of water. Read labels of cleaning products and follow recommendations provided on product labels. Labels contain instructions for safe and effective use of the cleaning product including precautions you should take when applying the product, such as wearing gloves or eye protection and making sure you have good ventilation  during use of the product. Remove gloves and wash hands immediately after cleaning.  Monitor yourself for signs and symptoms of illness Caregivers and household members are considered close contacts, should monitor their health, and will be asked to limit movement outside of the home to the extent possible. Follow the monitoring steps for close contacts listed on the symptom monitoring form.   ? If you have additional questions, contact your local health department or call the epidemiologist on call at [phone] (available 24/7). ? This guidance is subject to change. For the most up-to-date guidance from [facility], please refer to their website: [email or web address]

## 2020-06-08 NOTE — ED Provider Notes (Signed)
[facility] EMERGENCY DEPARTMENT Provider Note   CSN: [id number] Arrival date & time: 06/08/20  1624     History Chief Complaint  Patient presents with  . Fever    Stephanie Torres is a 25 y.o. female emergency department with chief complaint of URI symptoms.  She had onset of symptoms yesterday.  She complains of body aches, chills, fever, cough.  She has used her albuterol inhaler several times a day.  She denies any pleuritic chest pain, hemoptysis or unilateral leg swelling.  The patient is not vaccinated.  She denies nausea, vomiting or diarrhea.  She has not taken anything for her symptoms.  HPI     Past Medical History:  Diagnosis Date  . Anxiety   . Asthma   . Depression   . DUB (dysfunctional uterine bleeding)   . Heartburn   . PCOS (polycystic ovarian syndrome)     Patient Active Problem List   Diagnosis Date Noted  . Acute bacterial rhinosinusitis 05/04/2020  . Sore throat 05/04/2020  . Cough 05/04/2020  . Suicidal overdose, sequela ([place]) 04/27/2017  . Anxiety and depression 04/27/2017  . Depression 04/18/2016  . Ingrown toenail 04/12/2013  . PCOS (polycystic ovarian syndrome) 04/11/2013  . Hyperinsulinemia 11/21/2012  . Iron deficiency anemia 11/21/2012  . Other and unspecified hyperlipidemia 11/21/2012  . Gastritis 10/31/2012  . Migraine headache without aura 10/31/2012  . Asthma with acute exacerbation 10/31/2012  . Morbid obesity ([place]) 10/31/2012    Past Surgical History:  Procedure Laterality Date  . CESAREAN SECTION    . CYSTOSCOPY WITH RETROGRADE PYELOGRAM, URETEROSCOPY AND STENT PLACEMENT Right 02/26/2020   Procedure: CYSTOSCOPY WITH RETROGRADE PYELOGRAM, URETEROSCOPY WITH STENT PLACEMENT;  Surgeon: Cleon Gustin, MD;  Location: [facility];  Service: Urology;  Laterality: Right;  . CYSTOSCOPY WITH RETROGRADE PYELOGRAM, URETEROSCOPY AND STENT PLACEMENT Right 03/11/2020   Procedure: CYSTOSCOPY WITH RIGHT RETROGRADE PYELOGRAM, RIGHT URETEROSCOPY  AND RIGHT STENT EXCHANGE;  Surgeon: Cleon Gustin, MD;  Location: [facility];  Service: Urology;  Laterality: Right;  . HOLMIUM LASER APPLICATION  [id number]   Procedure: HOLMIUM LASER APPLICATION;  Surgeon: Cleon Gustin, MD;  Location: [facility];  Service: Urology;;  . TONSILLECTOMY       OB History    Gravida  1   Para  1   Term      Preterm      AB      Living        SAB      IAB      Ectopic      Multiple      Live Births              Family History  Problem Relation Age of Onset  . Diabetes Mother   . Hypertension Mother     Social History   Tobacco Use  . Smoking status: Never Smoker  . Smokeless tobacco: Never Used  Vaping Use  . Vaping Use: Never used  Substance Use Topics  . Alcohol use: No  . Drug use: No    Home Medications Prior to Admission medications   Medication Sig Start Date End Date Taking? Authorizing Provider  methylPREDNISolone (MEDROL DOSEPAK) 4 MG TBPK tablet Use as directed 06/08/20  Yes Nariyah Osias, PA-C  albuterol (PROVENTIL) (2.5 MG/3ML) 0.083% nebulizer solution Take 3 mLs (2.5 mg total) by nebulization every 6 (six) hours as needed for wheezing or shortness of breath. 04/18/19   Mikey Kirschner, MD  albuterol (VENTOLIN  HFA) 108 (90 Base) MCG/ACT inhaler Inhale 2 puffs into the lungs every 6 (six) hours as needed for wheezing or shortness of breath. 07/22/19   Mikey Kirschner, MD  amoxicillin-clavulanate (AUGMENTIN) 875-125 MG tablet Take 1 tablet by mouth 2 (two) times daily. 05/04/20   Chalmers Guest, NP  benzonatate (TESSALON) 100 MG capsule Take 1 capsule (100 mg total) by mouth 2 (two) times daily as needed for cough. 05/04/20   Chalmers Guest, NP  budesonide-formoterol (SYMBICORT) 80-4.5 MCG/ACT inhaler Inhale 2 puffs into the lungs 2 (two) times daily. 04/08/20   Elvia Collum M, DO  citalopram (CELEXA) 20 MG tablet Take 1 tablet (20 mg total) by mouth daily. 02/03/20   Nilda Simmer, NP  clonazePAM  (KLONOPIN) 0.5 MG tablet Take 1/2-1 tab po BID Prn severe anxiety. Do not take with pain medications. Patient taking differently: Take 0.25-0.5 mg by mouth 2 (two) times daily as needed for anxiety. Take 1/2-1 tab po BID Prn severe anxiety. Do not take with pain medications. 02/03/20   Nilda Simmer, NP  medroxyPROGESTERone (DEPO-PROVERA) 150 MG/ML injection Inject 1 mL (150 mg total) into the muscle every 3 (three) months. 03/16/20   Kathyrn Drown, MD    Allergies    Zoloft [sertraline hcl]  Review of Systems   Review of Systems Ten systems reviewed and are negative for acute change, except as noted in the HPI.   Physical Exam Updated Vital Signs BP (!) 148/79 (BP Location: Right Arm)   Pulse 92   Temp (!) 100.4 F (38 C) (Oral)   Resp 16   Ht 5\' 4"  (1.626 m)   Wt (!) 165.6 kg   SpO2 100%   BMI 62.65 kg/m   Physical Exam Vitals and nursing note reviewed.  Constitutional:      General: She is not in acute distress.    Appearance: She is well-developed and well-nourished. She is not diaphoretic.  HENT:     Head: Normocephalic and atraumatic.  Eyes:     General: No scleral icterus.    Conjunctiva/sclera: Conjunctivae normal.  Cardiovascular:     Rate and Rhythm: Normal rate and regular rhythm.     Heart sounds: Normal heart sounds. No murmur heard. No friction rub. No gallop.   Pulmonary:     Effort: Pulmonary effort is normal. No respiratory distress.     Breath sounds: Normal breath sounds. No wheezing, rhonchi or rales.  Abdominal:     General: Bowel sounds are normal. There is no distension.     Palpations: Abdomen is soft. There is no mass.     Tenderness: There is no abdominal tenderness. There is no guarding.  Musculoskeletal:     Cervical back: Normal range of motion.  Skin:    General: Skin is warm and dry.  Neurological:     Mental Status: She is alert and oriented to person, place, and time.  Psychiatric:        Behavior: Behavior normal.     ED  Results / Procedures / Treatments   Labs (all labs ordered are listed, but only abnormal results are displayed) Labs Reviewed  POC SARS CORONAVIRUS 2 AG -  ED - Abnormal; Notable for the following components:      Result Value   SARS Coronavirus 2 Ag POSITIVE (*)    All other components within normal limits    EKG None  Radiology No results found.  Procedures Procedures (including critical care time)  Medications  Ordered in ED Medications - No data to display  ED Course  I have reviewed the triage vital signs and the nursing notes.  Pertinent labs & imaging results that were available during my care of the patient were reviewed by me and considered in my medical decision making (see chart for details).    MDM Rules/Calculators/A&P                           Patient here with URI symptoms.  She is positive for coronavirus.  Patient mildly tachycardic with underlying elevated temperature.  I do not think that this reflects pulmonary embolus that she does have a pleuritic chest pain. onset of symptoms was yesterday.  She will be discharged with Medrol Dosepak.  Discussed home quarantine and return precautions.  Patient given work note. LAROSE BATRES was evaluated in Emergency Department on 06/08/2020 for the symptoms described in the history of present illness. She was evaluated in the context of the global COVID-19 pandemic, which necessitated consideration that the patient might be at risk for infection with the SARS-CoV-2 virus that causes COVID-19. Institutional protocols and algorithms that pertain to the evaluation of patients at risk for COVID-19 are in a state of rapid change based on information released by regulatory bodies including the CDC and federal and state organizations. These policies and algorithms were followed during the patient's care in the ED.  Final Clinical Impression(s) / ED Diagnoses Final diagnoses:  COVID-19 virus infection    Rx / DC Orders ED  Discharge Orders         Ordered    methylPREDNISolone (MEDROL DOSEPAK) 4 MG TBPK tablet        06/08/20 1747           Arthor Captain, PA-C 06/08/20 1801    Terrilee Files, MD 06/09/20 1038

## 2020-06-08 NOTE — ED Triage Notes (Signed)
Pt reports fever and congestion that started last night.

## 2020-06-09 ENCOUNTER — Other Ambulatory Visit: Payer: Medicaid Other

## 2020-06-09 ENCOUNTER — Telehealth: Payer: Medicaid Other | Admitting: Physician Assistant

## 2020-06-09 NOTE — Telephone Encounter (Signed)
Called to discuss with patient about COVID-19 symptoms and the use of one of the available treatments for those with mild to moderate Covid symptoms and at a high risk of hospitalization.  Pt appears to qualify for outpatient treatment due to co-morbid conditions and/or a member of an at-risk group in accordance with the FDA Emergency Use Authorization.    Symptom onset: 06/07/20 Vaccinated: No Qualifiers: BMI of >40, Asthma on maintenance therapy   Discussed IV MAB vs IV antiviral vs oral antiviral.   She was started steroids by ED provider. Patient will call us back if interested. Call back number given and reviewed ER precaution.  Rilley Stash - C

## 2020-06-11 ENCOUNTER — Other Ambulatory Visit: Payer: Medicaid Other

## 2020-06-15 ENCOUNTER — Encounter (HOSPITAL_COMMUNITY): Payer: Self-pay | Admitting: Emergency Medicine

## 2020-06-15 ENCOUNTER — Other Ambulatory Visit: Payer: Self-pay

## 2020-06-15 ENCOUNTER — Emergency Department (HOSPITAL_COMMUNITY): Payer: Medicaid Other

## 2020-06-15 ENCOUNTER — Emergency Department (HOSPITAL_COMMUNITY)
Admission: EM | Admit: 2020-06-15 | Discharge: 2020-06-15 | Disposition: A | Discharge status: Home / Self Care | Payer: Medicaid Other | Attending: Emergency Medicine | Admitting: Emergency Medicine

## 2020-06-15 DIAGNOSIS — J45901 Unspecified asthma with (acute) exacerbation: Secondary | ICD-10-CM | POA: Diagnosis not present

## 2020-06-15 DIAGNOSIS — R21 Rash and other nonspecific skin eruption: Secondary | ICD-10-CM | POA: Diagnosis not present

## 2020-06-15 DIAGNOSIS — R0789 Other chest pain: Secondary | ICD-10-CM | POA: Diagnosis not present

## 2020-06-15 DIAGNOSIS — U071 COVID-19: Secondary | ICD-10-CM

## 2020-06-15 DIAGNOSIS — R079 Chest pain, unspecified: Secondary | ICD-10-CM

## 2020-06-15 LAB — CBC
HCT: 42.4 % (ref 36.0–46.0)
Hemoglobin: 12.7 g/dL (ref 12.0–15.0)
MCH: 20.4 pg — ABNORMAL LOW (ref 26.0–34.0)
MCHC: 30 g/dL (ref 30.0–36.0)
MCV: 68.2 fL — ABNORMAL LOW (ref 80.0–100.0)
Platelets: 297 10*3/uL (ref 150–400)
RBC: 6.22 MIL/uL — ABNORMAL HIGH (ref 3.87–5.11)
RDW: 18.9 % — ABNORMAL HIGH (ref 11.5–15.5)
WBC: 7.4 10*3/uL (ref 4.0–10.5)
nRBC: 0 % (ref 0.0–0.2)

## 2020-06-15 LAB — BASIC METABOLIC PANEL
Anion gap: 9 (ref 5–15)
BUN: 12 mg/dL (ref 6–20)
CO2: 25 mmol/L (ref 22–32)
Calcium: 8.9 mg/dL (ref 8.9–10.3)
Chloride: 104 mmol/L (ref 98–111)
Creatinine, Ser: 0.75 mg/dL (ref 0.44–1.00)
GFR, Estimated: >60 mL/min (ref >60)
Glucose, Bld: 141 mg/dL — ABNORMAL HIGH (ref 70–99)
Potassium: 3.6 mmol/L (ref 3.5–5.1)
Sodium: 138 mmol/L (ref 135–145)

## 2020-06-15 LAB — D-DIMER, QUANTITATIVE: D-Dimer, Quant: <0.27 ug/mL-FEU (ref 0.00–0.50)

## 2020-06-15 LAB — TROPONIN I (HIGH SENSITIVITY)
Troponin I (High Sensitivity): <2 ng/L (ref <18)
Troponin I (High Sensitivity): <2 ng/L (ref <18)

## 2020-06-15 MED ORDER — SODIUM CHLORIDE 0.9 % IV BOLUS
1000.0000 mL | Freq: Once | INTRAVENOUS | Status: AC
  Administered 2020-06-15: 1000 mL via INTRAVENOUS

## 2020-06-15 NOTE — Discharge Instructions (Addendum)
Rest and make sure you are drinking plenty of fluids.  Complete the steroids you were prescribed at your last visit.  You may also add ibuprofen taking 3 OTC tablets every 8 hours for chest pain relief.  Your chest x-ray and labs including the test to rule out a blood clot in your lung is negative and reassuring.  Expect gradual improvement in your symptoms as you continue to heal from this COVID infection.  You have been tested for COVID-19 tonight, this test should result within 24 hours.  If it is negative you may return to work once your pain symptom is better.

## 2020-06-15 NOTE — ED Triage Notes (Addendum)
Pt to the ED with c/o Covid Positive, Chest pain, and shortness of breath.  Pt tested positive here on 06/08/20 and was offered outpatient treatments that she refused.  NAD. Pt states she was sent home from work and told to return with a negative Covid result. Pt was advised that she will probably not test negative today.

## 2020-06-15 NOTE — ED Provider Notes (Signed)
[facility] EMERGENCY DEPARTMENT Provider Note   CSN: [id number] Arrival date & time: 06/15/20  1411     History Chief Complaint  Patient presents with  . Chest Pain    Stephanie Torres is a 25 y.o. female with history of asthma, depression, heartburn, PCOS, history of iron deficiency anemia who tested positive for COVID 19 7 days ago, currently on day 8 of her illness presenting with gradual onset of chest pain and in creasing shortness of breath over the past 2 days.  She describes sharp midsternal pain that radiates through to her mid back and is worse with deep inspiration and also when she lies on her back.  She denies nausea or vomiting, abdominal pain, diarrhea, dysuria, also no dizziness.  She does have generalized myalgias and feels fatigued.  Her cough has been productive of a white sputum.  Also reports rhinorrhea and a mild sore throat.  She denies peripheral edema, calf pain.  She also denies wheezing, but has been using her albuterol nebs with no significant improvement in her symptoms, last dose was taken this morning.  Of note, she also endorses her employer requires a negative COVID test to return to work.  HPI     Past Medical History:  Diagnosis Date  . Anxiety   . Asthma   . Depression   . DUB (dysfunctional uterine bleeding)   . Heartburn   . PCOS (polycystic ovarian syndrome)     Patient Active Problem List   Diagnosis Date Noted  . Acute bacterial rhinosinusitis 05/04/2020  . Sore throat 05/04/2020  . Cough 05/04/2020  . Suicidal overdose, sequela ([place]) 04/27/2017  . Anxiety and depression 04/27/2017  . Depression 04/18/2016  . Ingrown toenail 04/12/2013  . PCOS (polycystic ovarian syndrome) 04/11/2013  . Hyperinsulinemia 11/21/2012  . Iron deficiency anemia 11/21/2012  . Other and unspecified hyperlipidemia 11/21/2012  . Gastritis 10/31/2012  . Migraine headache without aura 10/31/2012  . Asthma with acute exacerbation 10/31/2012  . Morbid  obesity ([place]) 10/31/2012    Past Surgical History:  Procedure Laterality Date  . CESAREAN SECTION    . CYSTOSCOPY WITH RETROGRADE PYELOGRAM, URETEROSCOPY AND STENT PLACEMENT Right 02/26/2020   Procedure: CYSTOSCOPY WITH RETROGRADE PYELOGRAM, URETEROSCOPY WITH STENT PLACEMENT;  Surgeon: Cleon Gustin, MD;  Location: [facility];  Service: Urology;  Laterality: Right;  . CYSTOSCOPY WITH RETROGRADE PYELOGRAM, URETEROSCOPY AND STENT PLACEMENT Right 03/11/2020   Procedure: CYSTOSCOPY WITH RIGHT RETROGRADE PYELOGRAM, RIGHT URETEROSCOPY AND RIGHT STENT EXCHANGE;  Surgeon: Cleon Gustin, MD;  Location: [facility];  Service: Urology;  Laterality: Right;  . HOLMIUM LASER APPLICATION  [id number]   Procedure: HOLMIUM LASER APPLICATION;  Surgeon: Cleon Gustin, MD;  Location: [facility];  Service: Urology;;  . TONSILLECTOMY       OB History    Gravida  1   Para  1   Term      Preterm      AB      Living        SAB      IAB      Ectopic      Multiple      Live Births              Family History  Problem Relation Age of Onset  . Diabetes Mother   . Hypertension Mother     Social History   Tobacco Use  . Smoking status: Never Smoker  . Smokeless tobacco: Never Used  Vaping Use  .  Vaping Use: Never used  Substance Use Topics  . Alcohol use: No  . Drug use: No    Home Medications Prior to Admission medications   Medication Sig Start Date End Date Taking? Authorizing Provider  albuterol (PROVENTIL) (2.5 MG/3ML) 0.083% nebulizer solution Take 3 mLs (2.5 mg total) by nebulization every 6 (six) hours as needed for wheezing or shortness of breath. 04/18/19   Mikey Kirschner, MD  albuterol (VENTOLIN HFA) 108 (90 Base) MCG/ACT inhaler Inhale 2 puffs into the lungs every 6 (six) hours as needed for wheezing or shortness of breath. 07/22/19   Mikey Kirschner, MD  amoxicillin-clavulanate (AUGMENTIN) 875-125 MG tablet Take 1 tablet by mouth 2 (two) times daily. 05/04/20    Chalmers Guest, NP  benzonatate (TESSALON) 100 MG capsule Take 1 capsule (100 mg total) by mouth 2 (two) times daily as needed for cough. 05/04/20   Chalmers Guest, NP  budesonide-formoterol (SYMBICORT) 80-4.5 MCG/ACT inhaler Inhale 2 puffs into the lungs 2 (two) times daily. 04/08/20   Elvia Collum M, DO  citalopram (CELEXA) 20 MG tablet Take 1 tablet (20 mg total) by mouth daily. 02/03/20   Nilda Simmer, NP  clonazePAM (KLONOPIN) 0.5 MG tablet Take 1/2-1 tab po BID Prn severe anxiety. Do not take with pain medications. Patient taking differently: Take 0.25-0.5 mg by mouth 2 (two) times daily as needed for anxiety. Take 1/2-1 tab po BID Prn severe anxiety. Do not take with pain medications. 02/03/20   Nilda Simmer, NP  medroxyPROGESTERone (DEPO-PROVERA) 150 MG/ML injection Inject 1 mL (150 mg total) into the muscle every 3 (three) months. 03/16/20   Kathyrn Drown, MD  methylPREDNISolone (MEDROL DOSEPAK) 4 MG TBPK tablet Use as directed 06/08/20   Margarita Mail, PA-C    Allergies    Zoloft [sertraline hcl]  Review of Systems   Review of Systems  Constitutional: Negative for chills and fever.  HENT: Positive for rhinorrhea. Negative for congestion and sore throat.   Eyes: Negative.   Respiratory: Positive for cough and shortness of breath. Negative for chest tightness.   Cardiovascular: Positive for chest pain. Negative for palpitations and leg swelling.  Gastrointestinal: Negative for abdominal pain, nausea and vomiting.  Genitourinary: Negative.   Musculoskeletal: Negative for arthralgias, joint swelling and neck pain.  Skin: Negative.  Negative for rash and wound.  Neurological: Negative for dizziness, weakness, light-headedness, numbness and headaches.  Psychiatric/Behavioral: Negative.   All other systems reviewed and are negative.   Physical Exam Updated Vital Signs BP 124/77   Pulse 88   Temp 98.2 F (36.8 C) (Oral)   Resp (!) 23   Ht 5\' 4"  (1.626 m)   Wt (!)  165.6 kg   SpO2 98%   BMI 62.65 kg/m   Physical Exam Vitals and nursing note reviewed.  Constitutional:      Appearance: She is well-developed and well-nourished.  HENT:     Head: Normocephalic and atraumatic.  Eyes:     Conjunctiva/sclera: Conjunctivae normal.  Cardiovascular:     Rate and Rhythm: Regular rhythm. Tachycardia present.     Pulses: Intact distal pulses.     Heart sounds: Normal heart sounds.  Pulmonary:     Effort: Pulmonary effort is normal. No tachypnea or accessory muscle usage.     Breath sounds: Normal breath sounds. No decreased breath sounds, wheezing or rhonchi.  Chest:     Chest wall: No tenderness.  Abdominal:     General: Bowel sounds are normal.  Palpations: Abdomen is soft.     Tenderness: There is no abdominal tenderness.  Musculoskeletal:        General: Normal range of motion.     Cervical back: Normal range of motion.     Right lower leg: No tenderness. No edema.     Left lower leg: No tenderness. No edema.  Skin:    General: Skin is warm and dry.  Neurological:     General: No focal deficit present.     Mental Status: She is alert.  Psychiatric:        Mood and Affect: Mood and affect normal.     ED Results / Procedures / Treatments   Labs (all labs ordered are listed, but only abnormal results are displayed) Labs Reviewed  BASIC METABOLIC PANEL - Abnormal; Notable for the following components:      Result Value   Glucose, Bld 141 (*)    All other components within normal limits  CBC - Abnormal; Notable for the following components:   RBC 6.22 (*)    MCV 68.2 (*)    MCH 20.4 (*)    RDW 18.9 (*)    All other components within normal limits  SARS CORONAVIRUS 2 (TAT 6-24 HRS)  D-DIMER, QUANTITATIVE (NOT AT [facility])  TROPONIN I (HIGH SENSITIVITY)  TROPONIN I (HIGH SENSITIVITY)    EKG EKG Interpretation  Date/Time:  Tuesday June 15 2020 14:13:24 EST Ventricular Rate:  117 PR Interval:  156 QRS Duration: 88 QT  Interval:  322 QTC Calculation: 449 R Axis:   98 Text Interpretation: Sinus tachycardia Rightward axis Cannot rule out Inferior infarct , age undetermined Abnormal ECG increased rate and nonspecific t wave inversions since prior 7/19 Confirmed by Aletta Edouard [phone]) on 06/15/2020 3:01:49 PM   Radiology DG Chest Portable 1 View  Result Date: 06/15/2020 CLINICAL DATA:  25 year old female with chest pain. Positive COVID-19. EXAM: PORTABLE CHEST 1 VIEW COMPARISON:  Chest radiograph dated 05/04/2020. FINDINGS: The heart size and mediastinal contours are within normal limits. Both lungs are clear. The visualized skeletal structures are unremarkable. IMPRESSION: No active disease. Electronically Signed   By: Anner Crete M.D.   On: 06/15/2020 20:00    Procedures Procedures (including critical care time)  Medications Ordered in ED Medications  sodium chloride 0.9 % bolus 1,000 mL (1,000 mLs Intravenous New Bag/Given 06/15/20 1932)    ED Course  I have reviewed the triage vital signs and the nursing notes.  Pertinent labs & imaging results that were available during my care of the patient were reviewed by me and considered in my medical decision making (see chart for details).    MDM Rules/Calculators/A&P                          Patient is with known COVID-19 with pleuritic chest pain which radiates into her back.  Chest x-ray is clear, her D-dimer is negative, patient is PERC negative, low risk for PE.  Delta troponin is also normal.  A send out COVID test was collected for results within 24 hours.  She was advised that she may return to work if this test is negative and her symptoms are improving.  She has no oxygen requirement here, patient has no increased work of breathing and her pulmonary exam is reassuring.  LATANA COLIN was evaluated in Emergency Department on 06/15/2020 for the symptoms described in the history of present illness. She was evaluated in the context  of the  global COVID-19 pandemic, which necessitated consideration that the patient might be at risk for infection with the SARS-CoV-2 virus that causes COVID-19. Institutional protocols and algorithms that pertain to the evaluation of patients at risk for COVID-19 are in a state of rapid change based on information released by regulatory bodies including the CDC and federal and state organizations. These policies and algorithms were followed during the patient's care in the ED.    Final Clinical Impression(s) / ED Diagnoses Final diagnoses:  [id number]  Chest pain, unspecified type    Rx / DC Orders ED Discharge Orders    None       Landis Martins 06/15/20 2037    Hayden Rasmussen, MD 06/16/20 1043

## 2020-06-16 ENCOUNTER — Telehealth: Payer: Self-pay

## 2020-06-16 LAB — SARS CORONAVIRUS 2 (TAT 6-24 HRS): SARS Coronavirus 2: POSITIVE — AB

## 2020-06-16 NOTE — Telephone Encounter (Signed)
Transition Care Management Unsuccessful Follow-up Telephone Call  Date of discharge and from where:  06/15/2020 [facility]  Attempts:  1st Attempt  Reason for unsuccessful TCM follow-up call:  No answer/busy, line stopped ringing then got disconnected.

## 2020-06-17 NOTE — Telephone Encounter (Signed)
Transition Care Management Unsuccessful Follow-up Telephone Call  Date of discharge and from where:  06/15/2020 [facility]  Attempts:  2nd Attempt  Reason for unsuccessful TCM follow-up call:  Unable to leave message

## 2020-06-18 NOTE — Telephone Encounter (Signed)
Transition Care Management Unsuccessful Follow-up Telephone Call  Date of discharge and from where:  06/15/2020 [facility]  Attempts:  3rd Attempt  Reason for unsuccessful TCM follow-up call:  Unable to reach patient

## 2020-07-07 ENCOUNTER — Ambulatory Visit (INDEPENDENT_AMBULATORY_CARE_PROVIDER_SITE_OTHER): Payer: Medicaid Other | Admitting: Family Medicine

## 2020-07-07 ENCOUNTER — Other Ambulatory Visit: Payer: Self-pay

## 2020-07-07 DIAGNOSIS — H109 Unspecified conjunctivitis: Secondary | ICD-10-CM | POA: Diagnosis not present

## 2020-07-07 DIAGNOSIS — D2210 Melanocytic nevi of unspecified eyelid, including canthus: Secondary | ICD-10-CM | POA: Diagnosis not present

## 2020-07-07 MED ORDER — ERYTHROMYCIN 5 MG/GM OP OINT: 1.0000 "application " | TOPICAL_OINTMENT | Freq: Three times a day (TID) | OPHTHALMIC | 0 refills | Status: DC

## 2020-07-07 NOTE — Progress Notes (Signed)
Patient ID: Stephanie Torres, female    DOB: 10/13/1995, 25 y.o.   MRN: [id number]   Chief Complaint  Patient presents with  . right eye swollen since yesterday   Subjective:  Nurses note- Patient states she has a mole in the corner of that eye that is getting bigger and she thinks she may need to see dermatology about it.  HPI Hurting to blink and close the eye, rt lateral side of rt eye. No drainage from the eye. Watering clear drainage from the rt eye.    Not sick with cold or cough.  Had some sneezing. No fever.  Used saline eye drops. Slightly itchy.   Has small mole on rt lateral crease, pt thinking it might have gotten bigger, has been there for a long time. Not new with this eye issue.   Medical History Tatisha has a past medical history of Anxiety, Asthma, Depression, DUB (dysfunctional uterine bleeding), Heartburn, and PCOS (polycystic ovarian syndrome).   Outpatient Encounter Medications as of 07/07/2020  Medication Sig  . erythromycin ophthalmic ointment Place 1 application into the right eye 3 (three) times daily.  Marland Kitchen albuterol (PROVENTIL) (2.5 MG/3ML) 0.083% nebulizer solution Take 3 mLs (2.5 mg total) by nebulization every 6 (six) hours as needed for wheezing or shortness of breath.  Marland Kitchen albuterol (VENTOLIN HFA) 108 (90 Base) MCG/ACT inhaler Inhale 2 puffs into the lungs every 6 (six) hours as needed for wheezing or shortness of breath.  Marland Kitchen amoxicillin-clavulanate (AUGMENTIN) 875-125 MG tablet Take 1 tablet by mouth 2 (two) times daily. (Patient not taking: Reported on 07/07/2020)  . benzonatate (TESSALON) 100 MG capsule Take 1 capsule (100 mg total) by mouth 2 (two) times daily as needed for cough.  . budesonide-formoterol (SYMBICORT) 80-4.5 MCG/ACT inhaler Inhale 2 puffs into the lungs 2 (two) times daily.  . citalopram (CELEXA) 20 MG tablet Take 1 tablet (20 mg total) by mouth daily.  . clonazePAM (KLONOPIN) 0.5 MG tablet Take 1/2-1 tab po BID Prn severe anxiety. Do  not take with pain medications. (Patient taking differently: Take 0.25-0.5 mg by mouth 2 (two) times daily as needed for anxiety. Take 1/2-1 tab po BID Prn severe anxiety. Do not take with pain medications.)  . medroxyPROGESTERone (DEPO-PROVERA) 150 MG/ML injection Inject 1 mL (150 mg total) into the muscle every 3 (three) months.  . methylPREDNISolone (MEDROL DOSEPAK) 4 MG TBPK tablet Use as directed   No facility-administered encounter medications on file as of 07/07/2020.     Review of Systems  Constitutional: Negative for chills and fever.  HENT: Negative for congestion, rhinorrhea and sore throat.   Respiratory: Negative for cough, shortness of breath and wheezing.   Cardiovascular: Negative for chest pain and leg swelling.  Gastrointestinal: Negative for abdominal pain, diarrhea, nausea and vomiting.  Genitourinary: Negative for dysuria and frequency.  Musculoskeletal: Negative for arthralgias and back pain.  Skin: Negative for rash.  Neurological: Negative for dizziness, weakness and headaches.     Vitals There were no vitals taken for this visit.  Objective:   Physical Exam Vitals and nursing note reviewed.  Constitutional:      General: She is not in acute distress.    Appearance: Normal appearance. She is not ill-appearing.  HENT:     Head: Normocephalic.  Eyes:     General:        Right eye: Discharge (clear drainage) present.        Left eye: No discharge.     Extraocular Movements:  Extraocular movements intact.     Pupils: Pupils are equal, round, and reactive to light.     Comments: +o.25 oval skin colored mole in rt lateral crease of eyelid. No redness, bleeding, or drainage. Mild swelling to rt upper eyelid, mild erythema to eyelid and conjunctiva.  Neurological:     Mental Status: She is alert.      Assessment and Plan   1. Conjunctivitis of right eye, unspecified conjunctivitis type - erythromycin ophthalmic ointment; Place 1 application into the right  eye 3 (three) times daily.  Dispense: 3.5 g; Refill: 0  2. Nevus of right eyelid   reviewed that this could be allergic vs. Viral conjunctivitis.  Gave erythromycin ointment in case the drainage turns thick and eye is getting more red and itchy.  -small mole on side of eyelid on rt.  Cont to monitor and may need referral to derm if wanting it removed.  Appears benign in appearance.  Return if getting bigger, bleeding or scabbing and not healing.  F/u prn.

## 2020-08-05 ENCOUNTER — Other Ambulatory Visit: Payer: Self-pay

## 2020-08-05 ENCOUNTER — Ambulatory Visit (INDEPENDENT_AMBULATORY_CARE_PROVIDER_SITE_OTHER): Payer: Medicaid Other | Admitting: Family Medicine

## 2020-08-05 DIAGNOSIS — J019 Acute sinusitis, unspecified: Secondary | ICD-10-CM | POA: Diagnosis not present

## 2020-08-05 DIAGNOSIS — J4521 Mild intermittent asthma with (acute) exacerbation: Secondary | ICD-10-CM | POA: Diagnosis not present

## 2020-08-05 DIAGNOSIS — R059 Cough, unspecified: Secondary | ICD-10-CM | POA: Diagnosis not present

## 2020-08-05 MED ORDER — ALBUTEROL SULFATE HFA 108 (90 BASE) MCG/ACT IN AERS: 2.0000 | INHALATION_SPRAY | RESPIRATORY_TRACT | 2 refills | Status: DC | PRN

## 2020-08-05 MED ORDER — PREDNISONE 20 MG PO TABS: ORAL_TABLET | ORAL | 0 refills | Status: DC

## 2020-08-05 MED ORDER — AMOXICILLIN 500 MG PO TABS: 500.0000 mg | ORAL_TABLET | Freq: Three times a day (TID) | ORAL | 0 refills | Status: DC

## 2020-08-05 NOTE — Progress Notes (Signed)
   Subjective:    Patient ID: Stephanie Torres, female    DOB: Nov 16, 1995, 25 y.o.   MRN: [id number]  HPI  Patient presents today with respiratory illness Number of days present- 3 days  Symptoms include-sneezing, cough, wheezing, sob, coughing  Presence of worrisome signs (severe shortness of breath, lethargy, etc.) - sob  Recent/current visit to urgent care or ER-none  Recent direct exposure to Covid- none  Any current Covid testing- none  She has a history of asthma.  She states she has been having troubles with significant coughing wheezing congestion and feeling somewhat short of breath.  She denies any high fever chills denies vomiting diarrhea.  States she had COVID in early January.  She has not had the vaccine.  She denies muscle aches sweats chills  Review of Systems Please see above    Objective:   Physical Exam Bilateral expiratory wheezes are noted heart is regular pulse normal O2 sats 96 HEENT benign       Assessment & Plan:  Viral syndrome versus environmental Possible acute rhinosinusitis Asthma flareup Patient states she is has gone months without having a significant flareup she no longer uses her steroid inhaler-Symbicort Warning signs were discussed patient does not any type of acute respiratory distress currently We will do a short course prednisone taper, antibiotics to cover for possible sinus, Covid test ordered, await results, doubtful it is Covid Patient is to keep track of how often she has next month then send that back to [place] she may need to be on a long-acting inhaler

## 2020-08-06 LAB — NOVEL CORONAVIRUS, NAA: SARS-CoV-2, NAA: NOT DETECTED

## 2020-08-06 LAB — SPECIMEN STATUS REPORT

## 2020-08-06 LAB — SARS-COV-2, NAA 2 DAY TAT

## 2020-08-23 ENCOUNTER — Ambulatory Visit: Payer: Medicaid Other | Admitting: Family Medicine

## 2020-08-25 ENCOUNTER — Ambulatory Visit: Payer: Medicaid Other | Admitting: Family Medicine

## 2020-08-30 ENCOUNTER — Ambulatory Visit: Payer: Medicaid Other | Admitting: Family Medicine

## 2020-08-31 ENCOUNTER — Encounter: Payer: Self-pay | Admitting: Family Medicine

## 2020-09-30 ENCOUNTER — Encounter (HOSPITAL_COMMUNITY): Payer: Self-pay | Admitting: Emergency Medicine

## 2020-09-30 ENCOUNTER — Emergency Department (HOSPITAL_COMMUNITY)
Admission: EM | Admit: 2020-09-30 | Discharge: 2020-09-30 | Disposition: A | Discharge status: Home / Self Care | Payer: Medicaid Other | Attending: Emergency Medicine | Admitting: Emergency Medicine

## 2020-09-30 ENCOUNTER — Other Ambulatory Visit: Payer: Self-pay

## 2020-09-30 DIAGNOSIS — R197 Diarrhea, unspecified: Secondary | ICD-10-CM | POA: Diagnosis not present

## 2020-09-30 DIAGNOSIS — J45901 Unspecified asthma with (acute) exacerbation: Secondary | ICD-10-CM | POA: Diagnosis not present

## 2020-09-30 DIAGNOSIS — R109 Unspecified abdominal pain: Secondary | ICD-10-CM | POA: Diagnosis not present

## 2020-09-30 DIAGNOSIS — R110 Nausea: Secondary | ICD-10-CM | POA: Insufficient documentation

## 2020-09-30 MED ORDER — ONDANSETRON 8 MG PO TBDP: 8.0000 mg | ORAL_TABLET | Freq: Three times a day (TID) | ORAL | 0 refills | Status: DC | PRN

## 2020-09-30 MED ORDER — ONDANSETRON 8 MG PO TBDP
8.0000 mg | ORAL_TABLET | Freq: Once | ORAL | Status: AC
  Administered 2020-09-30: 8 mg via ORAL
  Filled 2020-09-30: qty 1

## 2020-09-30 NOTE — ED Triage Notes (Signed)
Pt c/o diarrhea that started today and states her last bowel movement had large worms in it.

## 2020-09-30 NOTE — ED Provider Notes (Signed)
[facility] Provider Note   CSN: [id number] Arrival date & time: 09/30/20  5809     History Chief Complaint  Patient presents with  . Diarrhea    Stephanie Torres is a 25 y.o. female.  The history is provided by the patient.  Diarrhea Quality:  Watery Severity:  Moderate Onset quality:  Gradual Number of episodes:  3 Duration:  1 day Timing:  Intermittent Progression:  Worsening Relieved by:  None tried Worsened by:  Nothing Associated symptoms: abdominal pain   Associated symptoms: no fever and no vomiting   Risk factors: no travel to endemic areas    Patient reports he has had 3 episodes of what he diarrhea over the past day.  She reports the last bowel movement appeared to have large worms in it.  She has never had it before.  No bloody stool.  She reports mild abdominal discomfort and nausea. When she saw a worm she became very upset and came to the ER. No recent travel or any known exposures that could have caused this. She has not seen any worms on her buttocks.    Past Medical History:  Diagnosis Date  . Anxiety   . Asthma   . Depression   . DUB (dysfunctional uterine bleeding)   . Heartburn   . PCOS (polycystic ovarian syndrome)     Patient Active Problem List   Diagnosis Date Noted  . Acute bacterial rhinosinusitis 05/04/2020  . Sore throat 05/04/2020  . Cough 05/04/2020  . Suicidal overdose, sequela ([place]) 04/27/2017  . Anxiety and depression 04/27/2017  . Depression 04/18/2016  . Ingrown toenail 04/12/2013  . PCOS (polycystic ovarian syndrome) 04/11/2013  . Hyperinsulinemia 11/21/2012  . Iron deficiency anemia 11/21/2012  . Other and unspecified hyperlipidemia 11/21/2012  . Gastritis 10/31/2012  . Migraine headache without aura 10/31/2012  . Asthma with acute exacerbation 10/31/2012  . Morbid obesity ([place]) 10/31/2012    Past Surgical History:  Procedure Laterality Date  . CESAREAN SECTION    . CYSTOSCOPY WITH  RETROGRADE PYELOGRAM, URETEROSCOPY AND STENT PLACEMENT Right 02/26/2020   Procedure: CYSTOSCOPY WITH RETROGRADE PYELOGRAM, URETEROSCOPY WITH STENT PLACEMENT;  Surgeon: Cleon Gustin, MD;  Location: [facility];  Service: Urology;  Laterality: Right;  . CYSTOSCOPY WITH RETROGRADE PYELOGRAM, URETEROSCOPY AND STENT PLACEMENT Right 03/11/2020   Procedure: CYSTOSCOPY WITH RIGHT RETROGRADE PYELOGRAM, RIGHT URETEROSCOPY AND RIGHT STENT EXCHANGE;  Surgeon: Cleon Gustin, MD;  Location: [facility];  Service: Urology;  Laterality: Right;  . HOLMIUM LASER APPLICATION  98/08/3823   Procedure: HOLMIUM LASER APPLICATION;  Surgeon: Cleon Gustin, MD;  Location: [facility];  Service: Urology;;  . TONSILLECTOMY       OB History    Gravida  1   Para  1   Term      Preterm      AB      Living        SAB      IAB      Ectopic      Multiple      Live Births              Family History  Problem Relation Age of Onset  . Diabetes Mother   . Hypertension Mother     Social History   Tobacco Use  . Smoking status: Never Smoker  . Smokeless tobacco: Never Used  Vaping Use  . Vaping Use: Never used  Substance Use Topics  . Alcohol use: No  .  Drug use: No    Home Medications Prior to Admission medications   Medication Sig Start Date End Date Taking? Authorizing Provider  ondansetron (ZOFRAN ODT) 8 MG disintegrating tablet Take 1 tablet (8 mg total) by mouth every 8 (eight) hours as needed for nausea. 09/30/20  Yes Ripley Fraise, MD  albuterol (PROVENTIL) (2.5 MG/3ML) 0.083% nebulizer solution Take 3 mLs (2.5 mg total) by nebulization every 6 (six) hours as needed for wheezing or shortness of breath. 04/18/19   Mikey Kirschner, MD  albuterol (VENTOLIN HFA) 108 (90 Base) MCG/ACT inhaler Inhale 2 puffs into the lungs every 4 (four) hours as needed for wheezing or shortness of breath. 08/05/20   Kathyrn Drown, MD  citalopram (CELEXA) 20 MG tablet Take 1 tablet (20 mg total) by mouth  daily. 02/03/20   Nilda Simmer, NP    Allergies    Zoloft [sertraline hcl]  Review of Systems   Review of Systems  Constitutional: Negative for fever.  Gastrointestinal: Positive for abdominal pain, diarrhea and nausea. Negative for anal bleeding, blood in stool and vomiting.    Physical Exam Updated Vital Signs BP (!) 137/95   Pulse 85   Temp 98.5 F (36.9 C) (Oral)   Resp 17   Ht 1.626 m (5\' 4" )   Wt 117 kg   SpO2 96%   BMI 44.29 kg/m   Physical Exam CONSTITUTIONAL: Well developed/well nourished, anxious HEAD: Normocephalic/atraumatic EYES: EOMI/PERRL, no icterus NECK: supple no meningeal signs CV: S1/S2 noted, no murmurs/rubs/gallops noted LUNGS: Lungs are clear to auscultation bilaterally, no apparent distress ABDOMEN: soft, nontender, no rebound or guarding, bowel sounds noted throughout abdomen GU:no cva tenderness NEURO: Pt is awake/alert/appropriate, moves all extremitiesx4.  No facial droop.   EXTREMITIES:  full ROM SKIN: warm, color normal PSYCH: Anxious  ED Results / Procedures / Treatments   Labs (all labs ordered are listed, but only abnormal results are displayed) Labs Reviewed - No data to display  EKG None  Radiology No results found.  Procedures Procedures   Medications Ordered in ED Medications  ondansetron (ZOFRAN-ODT) disintegrating tablet 8 mg (has no administration in time range)    ED Course  I have reviewed the triage vital signs and the nursing notes.     MDM Rules/Calculators/A&P                          Patient is in no acute distress.  No focal abdominal tenderness.  No vomiting. Patient reports of seeing worms in her stool.  She is unable to produce a sample here in the ER.  Low suspicion that she has an infectious cause, but it is worth testing.  Patient will follow-up with her PCP for further outpatient testing.  Otherwise patient is appropriate discharge home. I have low suspicion for acute abdominal emergency at  this time Final Clinical Impression(s) / ED Diagnoses Final diagnoses:  Diarrhea, unspecified type    Rx / DC Orders ED Discharge Orders         Ordered    ondansetron (ZOFRAN ODT) 8 MG disintegrating tablet  Every 8 hours PRN        09/30/20 0501           Ripley Fraise, MD 09/30/20 [phone]

## 2020-10-01 ENCOUNTER — Encounter: Payer: Self-pay | Admitting: Family Medicine

## 2020-10-01 ENCOUNTER — Ambulatory Visit (INDEPENDENT_AMBULATORY_CARE_PROVIDER_SITE_OTHER): Payer: Medicaid Other | Admitting: Family Medicine

## 2020-10-01 VITALS — BP 122/74 | Temp 97.3°F | Wt 256.8 lb

## 2020-10-01 DIAGNOSIS — K921 Melena: Secondary | ICD-10-CM | POA: Diagnosis not present

## 2020-10-01 NOTE — Progress Notes (Signed)
   Subjective:    Patient ID: Stephanie Torres, female    DOB: [birth year], 25 y.o.   MRN: [id number] CC: follow-up ED visit, for diarrhea for possible parasites.  HPI Pt went to ER for diarrhea. Pt states that she is getting really bad cramps. Pt states that she is just not feeling good. Pt states she                                                                       has been doing research and the look like hook worms. Pt also bright red blood in stool a few days ago.     Review of Systems  Constitutional: Negative for chills and fever.  Respiratory: Negative for shortness of breath.   Cardiovascular: Negative for chest pain.  Gastrointestinal: Positive for abdominal pain, blood in stool, diarrhea and nausea. Negative for vomiting.       2 days ago saw blood in the stool ( in toilet).         Objective:   Physical Exam Vitals reviewed.  Constitutional:      Appearance: Normal appearance.  Cardiovascular:     Rate and Rhythm: Normal rate and regular rhythm.     Heart sounds: Normal heart sounds.  Pulmonary:     Effort: Pulmonary effort is normal.     Breath sounds: Normal breath sounds.  Abdominal:     General: Bowel sounds are normal.     Tenderness: There is abdominal tenderness. There is no guarding or rebound.     Comments: Generalized abdominal tenderness.   Skin:    General: Skin is warm and dry.  Neurological:     General: No focal deficit present.     Mental Status: She is alert.  Psychiatric:        Behavior: Behavior normal.           Assessment & Plan:

## 2020-10-01 NOTE — Patient Instructions (Signed)
Ova and Parasite Stool Test Why am I having this test? The ova and parasite stool test is an exam of your stool (feces) to check for signs of a parasite infection. This test may also be called a stool for ova and parasites test, or stool for O&P test. You may have this test if:  You have symptoms that may be caused by a parasite infection, such as: ? Diarrhea, especially if there is blood or mucus in your stool. ? Abdominal cramping or pain. ? Excessive gas (flatus). ? Nausea.  You have recently done something that raises the risk of parasitic infection, such as: ? Traveling internationally. ? Taking antibiotics for a long time. ? Drinking water from a well, creek, or river. The most common parasites that may cause an infection include:  Giardia intestinalis.  Hookworm (Ascaris species).  Tapeworm (Strongyloides species).  Cryptosporidium. What is being tested? This test checks your stool for the presence of a parasite or eggs (ova) from a parasite. What kind of sample is taken? A stool sample is required for this test.   How do I collect samples at home? When collecting a stool sample at home, make sure you:  Use supplies and instructions that you received from the lab.  Have a bowel movement directly into a clean, dry container. Do not collect stool from the water in the toilet.  Transfer the sample into the germ-free (sterile) cup that you received from the lab.  Do not let any toilet paper or urine get into the cup.  Wash your hands with soap and water after collecting the sample.  Make sure that the label on the specimen matches your legal, full name (do not use nicknames) and date of birth.  Be sure to write your collection date and collection time clearly on the label with ink that will not smear.  Return the sample to the lab as instructed. Tell a health care provider about:  Any allergies you have.  All medicines you are taking, including vitamins, herbs, eye  drops, creams, and over-the-counter medicines.  Any surgeries you have had.  Any medical conditions you have.  Whether you are pregnant or may be pregnant. How are the results reported? Your test results will be reported as either positive or negative for ova or parasites in your stool. What do the results mean? A negative result means that you have no ova or parasites in your stool, which is normal. A positive result means that you have ova, parasites, or both in your stool. This means that you have a parasite infection in your stomach or intestines (gastrointestinal tract, GI tract). The test results will specify which type of parasite is present. Talk with your health care provider about what your results mean. Questions to ask your health care provider Ask your health care provider, or the department that is doing the test:  When will my results be ready?  How will I get my results?  What are my treatment options?  What other tests do I need?  What are my next steps? Summary  The ova and parasite stool test is an exam of your stool (feces) to check for signs of a parasite infection.  A negative result means that you have no ova or parasites in your stool, which is normal.  A positive result means that you have ova, parasites, or both in your stool. This information is not intended to replace advice given to you by your health care provider. Make  sure you discuss any questions you have with your health care provider. Document Revised: 01/23/2020 Document Reviewed: 01/23/2020 Elsevier Patient Education  [place].

## 2020-10-01 NOTE — Progress Notes (Signed)
Patient ID: Stephanie Torres, female    DOB: 1996/01/16, 25 y.o.   MRN: [id number]  Pt here for ER follow up. Pt states she has been having bad abdominal pains and feels bad. Pt had large amount of bright red blood in BM.  Chief Complaint  Patient presents with  . ER Followup   Subjective:  CC: follow-up ED for diarrhea and worms in stool  This is a new problem.  Presents today to follow-up from an ER visit from April 28.  Was at work, Paediatric nurse, had a bowel movement, thought she had worms in her stool.  There was no blood noted at that time, however she found bright red blood in her toilet 2 days prior.  Her last diarrhea stool was at 530 yesterday morning, stool is no longer loose.  Reports that she continues to have abdominal cramping, it is generalized in nature.  Denies recent travel, has not had recent antibiotic use, does not eat raw meat, no recent hiking or camping trips.  Denies rectal itching.  Denies fever, chills, chest pain, shortness of breath.  Endorses abdominal pain, blood in the stool, diarrhea and nausea.  Noted blood in the toilet 2 days ago.  Does not have a history of any GI issues.    Medical History Lauralie has a past medical history of Anxiety, Asthma, Depression, DUB (dysfunctional uterine bleeding), Heartburn, and PCOS (polycystic ovarian syndrome).   Outpatient Encounter Medications as of 10/01/2020  Medication Sig  . albuterol (PROVENTIL) (2.5 MG/3ML) 0.083% nebulizer solution Take 3 mLs (2.5 mg total) by nebulization every 6 (six) hours as needed for wheezing or shortness of breath.  Marland Kitchen albuterol (VENTOLIN HFA) 108 (90 Base) MCG/ACT inhaler Inhale 2 puffs into the lungs every 4 (four) hours as needed for wheezing or shortness of breath.  . citalopram (CELEXA) 20 MG tablet Take 1 tablet (20 mg total) by mouth daily.  . ondansetron (ZOFRAN ODT) 8 MG disintegrating tablet Take 1 tablet (8 mg total) by mouth every 8 (eight) hours as needed for nausea.   No  facility-administered encounter medications on file as of 10/01/2020.     Review of Systems  Constitutional: Negative for chills and fever.  Respiratory: Negative for cough and shortness of breath.   Gastrointestinal: Positive for abdominal pain, blood in stool, diarrhea and nausea. Negative for vomiting.       Noted blood in toilet 2 days ago- described as bright red with worms in it.      Vitals BP 122/74   Temp (!) 97.3 F (36.3 C)   Wt 256 lb 12.8 oz (116.5 kg)   BMI 44.08 kg/m   Objective:   Physical Exam Vitals reviewed.  Constitutional:      Appearance: Normal appearance.  Cardiovascular:     Rate and Rhythm: Normal rate and regular rhythm.     Heart sounds: Normal heart sounds.  Pulmonary:     Effort: Pulmonary effort is normal.     Breath sounds: Normal breath sounds.  Abdominal:     General: Bowel sounds are normal.     Tenderness: There is generalized abdominal tenderness. There is no guarding.  Skin:    General: Skin is warm and dry.  Neurological:     General: No focal deficit present.     Mental Status: She is alert.  Psychiatric:        Behavior: Behavior normal.      Assessment and Plan   1. Blood in stool -  CBC with Differential - Ova and parasite examination - Stool Culture   Will get lab work today. Has been greater than 24 hours since last diarrhea stool. She will bring stool sample to lab as she can collect sample.  Okay to return to work tomorrow night (as stated by ED) unless symptoms return: vomiting  and diarrhea. Understands.   Agrees with plan of care discussed today. Understands warning signs to seek further care: chest pain, shortness of breath, any significant change in health.  Understands to follow-up in one week, consider referral to GI specialist for rectal bleeding. Need to rule out infectious process first.     Pecolia Ades, NP 10/01/2020

## 2020-10-08 ENCOUNTER — Other Ambulatory Visit: Payer: Self-pay

## 2020-10-08 ENCOUNTER — Encounter: Payer: Self-pay | Admitting: Family Medicine

## 2020-10-08 ENCOUNTER — Ambulatory Visit: Payer: Medicaid Other | Admitting: Family Medicine

## 2020-10-08 VITALS — BP 126/84 | HR 93 | Temp 98.4°F | Ht 64.0 in | Wt 259.0 lb

## 2020-10-08 DIAGNOSIS — R1032 Left lower quadrant pain: Secondary | ICD-10-CM

## 2020-10-08 DIAGNOSIS — F32A Depression, unspecified: Secondary | ICD-10-CM

## 2020-10-08 DIAGNOSIS — K921 Melena: Secondary | ICD-10-CM | POA: Diagnosis not present

## 2020-10-08 DIAGNOSIS — F419 Anxiety disorder, unspecified: Secondary | ICD-10-CM

## 2020-10-08 MED ORDER — AMOXICILLIN-POT CLAVULANATE 875-125 MG PO TABS: 1.0000 | ORAL_TABLET | Freq: Two times a day (BID) | ORAL | 0 refills | Status: DC

## 2020-10-08 NOTE — Progress Notes (Signed)
Patient ID: Stephanie Torres, female    DOB: 12-14-1995, 25 y.o.   MRN: [id number]   Chief Complaint  Patient presents with  . lower abdominal cramping    Had some constipation as well as diarrhea since last visit - dropping off stool today at [facility]   Subjective:  CC: follow-up abdominal pain and parasites  This is not a new problem.  Presents today to follow-up from lower abdominal cramping/pain and diarrhea.  Was initially seen on April 29 at this office after going to the emergency department for what she thought was parasites in her stool and bright red blood in the toilet.  She reports today that she has been unable to return the stool sample, which she plans to do today.  She endorses fever and chills, abdominal pain constipation and diarrhea since last visit.  She is tearful upon entry in the room today.  She will also get the CBC done today that was ordered on 4/29.    Medical History Hasset has a past medical history of Anxiety, Asthma, Depression, DUB (dysfunctional uterine bleeding), Heartburn, and PCOS (polycystic ovarian syndrome).   Outpatient Encounter Medications as of 10/08/2020  Medication Sig  . amoxicillin-clavulanate (AUGMENTIN) 875-125 MG tablet Take 1 tablet by mouth 2 (two) times daily.  Marland Kitchen albuterol (PROVENTIL) (2.5 MG/3ML) 0.083% nebulizer solution Take 3 mLs (2.5 mg total) by nebulization every 6 (six) hours as needed for wheezing or shortness of breath. (Patient not taking: Reported on 10/08/2020)  . albuterol (VENTOLIN HFA) 108 (90 Base) MCG/ACT inhaler Inhale 2 puffs into the lungs every 4 (four) hours as needed for wheezing or shortness of breath. (Patient not taking: Reported on 10/08/2020)  . citalopram (CELEXA) 20 MG tablet Take 1 tablet (20 mg total) by mouth daily. (Patient not taking: Reported on 10/08/2020)  . ondansetron (ZOFRAN ODT) 8 MG disintegrating tablet Take 1 tablet (8 mg total) by mouth every 8 (eight) hours as needed for nausea. (Patient not  taking: Reported on 10/08/2020)   No facility-administered encounter medications on file as of 10/08/2020.     Review of Systems  Constitutional: Positive for chills and fever.       Fever last weekend. T-max: 102.3  Gastrointestinal: Positive for abdominal pain, constipation and diarrhea.  Psychiatric/Behavioral: Negative for self-injury and suicidal ideas. The patient is nervous/anxious.        Tearful in office. Denies thoughts of self-harm, no plan to commit suicide.      Vitals BP 126/84   Pulse 93   Temp 98.4 F (36.9 C)   Ht 5\' 4"  (1.626 m)   Wt 259 lb (117.5 kg)   SpO2 96%   BMI 44.46 kg/m   Objective:   Physical Exam Vitals reviewed.  Constitutional:      Appearance: Normal appearance.  Cardiovascular:     Rate and Rhythm: Normal rate and regular rhythm.     Heart sounds: Normal heart sounds.  Pulmonary:     Effort: Pulmonary effort is normal.     Breath sounds: Normal breath sounds.  Abdominal:     General: Bowel sounds are normal.     Tenderness: There is abdominal tenderness in the left lower quadrant.  Skin:    General: Skin is warm and dry.  Neurological:     General: No focal deficit present.     Mental Status: She is alert.  Psychiatric:        Mood and Affect: Mood is depressed. Affect is tearful.  Behavior: Behavior normal.        Thought Content: Thought content does not include suicidal ideation. Thought content does not include suicidal plan.     Comments: PHQ-9: 19 GAD-7: 16      Assessment and Plan   1. Anxiety and depression - Ambulatory referral to Psychology  2. Left lower quadrant abdominal pain - amoxicillin-clavulanate (AUGMENTIN) 875-125 MG tablet; Take 1 tablet by mouth 2 (two) times daily.  Dispense: 20 tablet; Refill: 0   Will submit stool sample and get CBC done today for left lower quadrant abdominal pain and diarrhea.  Significant left lower quadrant tenderness upon physical exam, will treat with Augmentin twice  per day for 10 days.  Extremely tearful throughout visit, PHQ-9 19, gad 7: 16.  Denies thoughts of self-harm, no plans for suicide.  Much of today's visit spent discussing mental health and the importance in order to achieve physical health.  Information given on behavioral health urgent care in [place] if needed.  Will place behavioral psychology referral today, not interested in going on medications, does not like the way it makes her feel.  Her mental health is interfering with her job and her home life at this time.  Agrees with plan of care discussed today. Understands warning signs to seek further care: chest pain, shortness of breath, any significant change in health.  Understands to follow-up on May 12 for abdominal pain, sooner if anything changes.  Warning signs discussed as reasons to seek care at the emergency department to include worsening abdominal pain and worsening mental status.   Pecolia Ades, NP 10/08/2020

## 2020-10-08 NOTE — Patient Instructions (Addendum)
[facility] [place], [place]  Open 24/7 No appointment necessary     Abdominal Pain, Adult Many things can cause belly (abdominal) pain. Most times, belly pain is not dangerous. Many cases of belly pain can be watched and treated at home. Sometimes, though, belly pain is serious. Your doctor will try to find the cause of your belly pain. Follow these instructions at home: Medicines  Take over-the-counter and prescription medicines only as told by your doctor.  Do not take medicines that help you poop (laxatives) unless told by your doctor. General instructions  Watch your belly pain for any changes.  Drink enough fluid to keep your pee (urine) pale yellow.  Keep all follow-up visits as told by your doctor. This is important.   Contact a doctor if:  Your belly pain changes or gets worse.  You are not hungry, or you lose weight without trying.  You are having trouble pooping (constipated) or have watery poop (diarrhea) for more than 2-3 days.  You have pain when you pee or poop.  Your belly pain wakes you up at night.  Your pain gets worse with meals, after eating, or with certain foods.  You are vomiting and cannot keep anything down.  You have a fever.  You have blood in your pee. Get help right away if:  Your pain does not go away as soon as your doctor says it should.  You cannot stop vomiting.  Your pain is only in areas of your belly, such as the right side or the left lower part of the belly.  You have bloody or black poop, or poop that looks like tar.  You have very bad pain, cramping, or bloating in your belly.  You have signs of not having enough fluid or water in your body (dehydration), such as: ? Dark pee, very little pee, or no pee. ? Cracked lips. ? Dry mouth. ? Sunken eyes. ? Sleepiness. ? Weakness.  You have trouble breathing or chest pain. Summary  Many cases of belly pain can be  watched and treated at home.  Watch your belly pain for any changes.  Take over-the-counter and prescription medicines only as told by your doctor.  Contact a doctor if your belly pain changes or gets worse.  Get help right away if you have very bad pain, cramping, or bloating in your belly. This information is not intended to replace advice given to you by your health care provider. Make sure you discuss any questions you have with your health care provider. Document Revised: 09/30/2018 Document Reviewed: 09/30/2018 Elsevier Patient Education  [employer].

## 2020-10-09 LAB — CBC WITH DIFFERENTIAL/PLATELET
Basophils Absolute: 0.1 10*3/uL (ref 0.0–0.2)
Basos: 1 %
EOS (ABSOLUTE): 0.6 10*3/uL — ABNORMAL HIGH (ref 0.0–0.4)
Eos: 5 %
Hematocrit: 37.1 % (ref 34.0–46.6)
Hemoglobin: 11.4 g/dL (ref 11.1–15.9)
Immature Grans (Abs): 0 10*3/uL (ref 0.0–0.1)
Immature Granulocytes: 0 %
Lymphocytes Absolute: 3.6 10*3/uL — ABNORMAL HIGH (ref 0.7–3.1)
Lymphs: 33 %
MCH: 21.8 pg — ABNORMAL LOW (ref 26.6–33.0)
MCHC: 30.7 g/dL — ABNORMAL LOW (ref 31.5–35.7)
MCV: 71 fL — ABNORMAL LOW (ref 79–97)
Monocytes Absolute: 0.6 10*3/uL (ref 0.1–0.9)
Monocytes: 6 %
Neutrophils Absolute: 6.1 10*3/uL (ref 1.4–7.0)
Neutrophils: 55 %
Platelets: 405 10*3/uL (ref 150–450)
RBC: 5.23 x10E6/uL (ref 3.77–5.28)
RDW: 15.8 % — ABNORMAL HIGH (ref 11.7–15.4)
WBC: 11 10*3/uL — ABNORMAL HIGH (ref 3.4–10.8)

## 2020-10-10 ENCOUNTER — Emergency Department (HOSPITAL_COMMUNITY)
Admission: EM | Admit: 2020-10-10 | Discharge: 2020-10-10 | Disposition: A | Discharge status: Home / Self Care | Payer: Medicaid Other | Attending: Emergency Medicine | Admitting: Emergency Medicine

## 2020-10-10 ENCOUNTER — Encounter (HOSPITAL_COMMUNITY): Payer: Self-pay | Admitting: *Deleted

## 2020-10-10 ENCOUNTER — Emergency Department (HOSPITAL_COMMUNITY): Payer: Medicaid Other

## 2020-10-10 ENCOUNTER — Other Ambulatory Visit: Payer: Self-pay

## 2020-10-10 DIAGNOSIS — N83209 Unspecified ovarian cyst, unspecified side: Secondary | ICD-10-CM | POA: Diagnosis not present

## 2020-10-10 DIAGNOSIS — K760 Fatty (change of) liver, not elsewhere classified: Secondary | ICD-10-CM | POA: Diagnosis not present

## 2020-10-10 DIAGNOSIS — R109 Unspecified abdominal pain: Secondary | ICD-10-CM | POA: Diagnosis not present

## 2020-10-10 DIAGNOSIS — N83201 Unspecified ovarian cyst, right side: Secondary | ICD-10-CM | POA: Diagnosis not present

## 2020-10-10 DIAGNOSIS — J45909 Unspecified asthma, uncomplicated: Secondary | ICD-10-CM | POA: Insufficient documentation

## 2020-10-10 DIAGNOSIS — R1031 Right lower quadrant pain: Secondary | ICD-10-CM | POA: Diagnosis present

## 2020-10-10 LAB — COMPREHENSIVE METABOLIC PANEL
ALT: 17 U/L (ref 0–44)
AST: 18 U/L (ref 15–41)
Albumin: 3.4 g/dL — ABNORMAL LOW (ref 3.5–5.0)
Alkaline Phosphatase: 70 U/L (ref 38–126)
Anion gap: 6 (ref 5–15)
BUN: 8 mg/dL (ref 6–20)
CO2: 26 mmol/L (ref 22–32)
Calcium: 8.6 mg/dL — ABNORMAL LOW (ref 8.9–10.3)
Chloride: 105 mmol/L (ref 98–111)
Creatinine, Ser: 0.61 mg/dL (ref 0.44–1.00)
GFR, Estimated: >60 mL/min (ref >60)
Glucose, Bld: 117 mg/dL — ABNORMAL HIGH (ref 70–99)
Potassium: 3.8 mmol/L (ref 3.5–5.1)
Sodium: 137 mmol/L (ref 135–145)
Total Bilirubin: 0.8 mg/dL (ref 0.3–1.2)
Total Protein: 6.7 g/dL (ref 6.5–8.1)

## 2020-10-10 LAB — URINALYSIS, ROUTINE W REFLEX MICROSCOPIC
Bilirubin Urine: NEGATIVE
Glucose, UA: NEGATIVE mg/dL
Hgb urine dipstick: NEGATIVE
Ketones, ur: NEGATIVE mg/dL
Nitrite: NEGATIVE
Protein, ur: NEGATIVE mg/dL
Specific Gravity, Urine: 1.025 (ref 1.005–1.030)
Squamous Epithelial / HPF: >50 — ABNORMAL HIGH (ref 0–5)
WBC, UA: >50 WBC/hpf — ABNORMAL HIGH (ref 0–5)
pH: 6 (ref 5.0–8.0)

## 2020-10-10 LAB — CBC WITH DIFFERENTIAL/PLATELET
Abs Immature Granulocytes: 0.03 10*3/uL (ref 0.00–0.07)
Basophils Absolute: 0.1 10*3/uL (ref 0.0–0.1)
Basophils Relative: 1 %
Eosinophils Absolute: 0.2 10*3/uL (ref 0.0–0.5)
Eosinophils Relative: 2 %
HCT: 36.7 % (ref 36.0–46.0)
Hemoglobin: 11.4 g/dL — ABNORMAL LOW (ref 12.0–15.0)
Immature Granulocytes: 0 %
Lymphocytes Relative: 11 %
Lymphs Abs: 1.2 10*3/uL (ref 0.7–4.0)
MCH: 22.2 pg — ABNORMAL LOW (ref 26.0–34.0)
MCHC: 31.1 g/dL (ref 30.0–36.0)
MCV: 71.4 fL — ABNORMAL LOW (ref 80.0–100.0)
Monocytes Absolute: 0.5 10*3/uL (ref 0.1–1.0)
Monocytes Relative: 5 %
Neutro Abs: 9 10*3/uL — ABNORMAL HIGH (ref 1.7–7.7)
Neutrophils Relative %: 81 %
Platelets: 362 10*3/uL (ref 150–400)
RBC: 5.14 MIL/uL — ABNORMAL HIGH (ref 3.87–5.11)
RDW: 16.2 % — ABNORMAL HIGH (ref 11.5–15.5)
WBC: 10.9 10*3/uL — ABNORMAL HIGH (ref 4.0–10.5)
nRBC: 0 % (ref 0.0–0.2)

## 2020-10-10 LAB — LIPASE, BLOOD: Lipase: 22 U/L (ref 11–51)

## 2020-10-10 LAB — PREGNANCY, URINE: Preg Test, Ur: NEGATIVE

## 2020-10-10 MED ORDER — ONDANSETRON HCL 4 MG/2ML IJ SOLN
4.0000 mg | Freq: Once | INTRAMUSCULAR | Status: AC
  Administered 2020-10-10: 4 mg via INTRAVENOUS
  Filled 2020-10-10: qty 2

## 2020-10-10 MED ORDER — IOHEXOL 300 MG/ML  SOLN
100.0000 mL | Freq: Once | INTRAMUSCULAR | Status: AC | PRN
  Administered 2020-10-10: 100 mL via INTRAVENOUS

## 2020-10-10 MED ORDER — FENTANYL CITRATE (PF) 100 MCG/2ML IJ SOLN
50.0000 ug | Freq: Once | INTRAMUSCULAR | Status: AC
  Administered 2020-10-10: 50 ug via INTRAVENOUS
  Filled 2020-10-10: qty 2

## 2020-10-10 NOTE — Discharge Instructions (Addendum)
You were seen in the emergency room today for your sudden onset right lower belly pain.  Your blood work and physical exam are very reassuring.  The CT scan of your abdomen revealed fluid in your pelvis, apparently from a ruptured cyst on your ovary.  This is consistent with your history, and is a nonemergent problem.  The fluid in your abdomen from the ruptured cyst will resolve on its own, and the pain shall gradually improve over the next few days.  May alternate Tylenol and ibuprofen every 3 hours as needed.  Please follow-up with your primary care doctor within the next week and complete the antibiotics previously prescribed to you for your abdominal infection.  Please follow-up with your OB/GYN for your annual visit.  Return to the emergency department with any worsening abdominal pain, nausea or vomiting does not stop, chest pain, difficulty breathing, or any other new severe symptoms.

## 2020-10-10 NOTE — ED Triage Notes (Signed)
Right lower quadrant pain, recently treated for worms

## 2020-10-10 NOTE — ED Provider Notes (Signed)
[facility] EMERGENCY DEPARTMENT Provider Note   CSN: [id number] Arrival date & time: 10/10/20  1105     History Chief Complaint  Patient presents with  . Abdominal Pain    Stephanie Torres is a 25 y.o. female who presents with concern for sudden onset right lower quadrant pain that woke her from her sleep this morning.  The patient has been treated for generalized abdominal discomfort, and diarrhea x10 days, started on Augmentin by her PCP.  Patient states that the initial days she had the symptoms she saw "lots of small worms" in her stool that were white and stringy, however since November moving and she has not had any recurrent episodes of this since that time.  She did have 1 episode of bright red blood on the stool in the toilet, however has not had any recurrence of that either.  Patient did submit stool sample to  primary care doctor.  Ova and parasite exam pending, stool culture negative for Shigella, Salmonella, E. coli with Shiga toxin, and preliminary result Campylobacter culture is negative.  Parasitic infection appears unlikely given lack of recent travel, exposure to well water or fresh body of water, minimal intake of fresh produce.  Patient was started on Augmentin by PCP for left lower quadrant pain and tenderness to palpation in the office 2 days ago.  Patient presents to the ED today due to worsening right-sided abdominal pain and desire for further evaluation.  She does endorse chills and associated nausea but no vomiting.  Patient states that the pain started today feels unrelated to her history of recent abdominal infection.  I personally reviewed this patient's medical records.  She has history of asthma, PCOS, dysfunctional uterine bleeding and anxiety.  Patient states last period was approximately 5 years ago due to irregular periods, PCOS, and previous usage of Depo-Provera.  HPI     Past Medical History:  Diagnosis Date  . Anxiety   . Asthma   . Depression    . DUB (dysfunctional uterine bleeding)   . Heartburn   . PCOS (polycystic ovarian syndrome)     Patient Active Problem List   Diagnosis Date Noted  . Left lower quadrant abdominal pain 10/08/2020  . Blood in stool 10/01/2020  . Acute bacterial rhinosinusitis 05/04/2020  . Sore throat 05/04/2020  . Cough 05/04/2020  . Suicidal overdose, sequela ([place]) 04/27/2017  . Anxiety and depression 04/27/2017  . Depression 04/18/2016  . Ingrown toenail 04/12/2013  . PCOS (polycystic ovarian syndrome) 04/11/2013  . Hyperinsulinemia 11/21/2012  . Iron deficiency anemia 11/21/2012  . Other and unspecified hyperlipidemia 11/21/2012  . Gastritis 10/31/2012  . Migraine headache without aura 10/31/2012  . Asthma with acute exacerbation 10/31/2012  . Morbid obesity ([place]) 10/31/2012    Past Surgical History:  Procedure Laterality Date  . CESAREAN SECTION    . CYSTOSCOPY WITH RETROGRADE PYELOGRAM, URETEROSCOPY AND STENT PLACEMENT Right 02/26/2020   Procedure: CYSTOSCOPY WITH RETROGRADE PYELOGRAM, URETEROSCOPY WITH STENT PLACEMENT;  Surgeon: Cleon Gustin, MD;  Location: [facility];  Service: Urology;  Laterality: Right;  . CYSTOSCOPY WITH RETROGRADE PYELOGRAM, URETEROSCOPY AND STENT PLACEMENT Right 03/11/2020   Procedure: CYSTOSCOPY WITH RIGHT RETROGRADE PYELOGRAM, RIGHT URETEROSCOPY AND RIGHT STENT EXCHANGE;  Surgeon: Cleon Gustin, MD;  Location: [facility];  Service: Urology;  Laterality: Right;  . HOLMIUM LASER APPLICATION  47/11/5463   Procedure: HOLMIUM LASER APPLICATION;  Surgeon: Cleon Gustin, MD;  Location: [facility];  Service: Urology;;  . Darrick Huntsman  OB History    Gravida  1   Para  1   Term      Preterm      AB      Living        SAB      IAB      Ectopic      Multiple      Live Births              Family History  Problem Relation Age of Onset  . Diabetes Mother   . Hypertension Mother     Social History   Tobacco Use  . Smoking status:  Never Smoker  . Smokeless tobacco: Never Used  Vaping Use  . Vaping Use: Never used  Substance Use Topics  . Alcohol use: No  . Drug use: No    Home Medications Prior to Admission medications   Medication Sig Start Date End Date Taking? Authorizing Provider  albuterol (PROVENTIL) (2.5 MG/3ML) 0.083% nebulizer solution Take 3 mLs (2.5 mg total) by nebulization every 6 (six) hours as needed for wheezing or shortness of breath. Patient not taking: Reported on 10/08/2020 04/18/19   Mikey Kirschner, MD  albuterol (VENTOLIN HFA) 108 (90 Base) MCG/ACT inhaler Inhale 2 puffs into the lungs every 4 (four) hours as needed for wheezing or shortness of breath. Patient not taking: Reported on 10/08/2020 08/05/20   Kathyrn Drown, MD  amoxicillin-clavulanate (AUGMENTIN) 875-125 MG tablet Take 1 tablet by mouth 2 (two) times daily. 10/08/20   Chalmers Guest, NP  citalopram (CELEXA) 20 MG tablet Take 1 tablet (20 mg total) by mouth daily. Patient not taking: Reported on 10/08/2020 02/03/20   Nilda Simmer, NP  ondansetron (ZOFRAN ODT) 8 MG disintegrating tablet Take 1 tablet (8 mg total) by mouth every 8 (eight) hours as needed for nausea. Patient not taking: Reported on 10/08/2020 09/30/20   Ripley Fraise, MD    Allergies    Zoloft [sertraline hcl]  Review of Systems   Review of Systems  Constitutional: Positive for activity change and appetite change. Negative for diaphoresis, fatigue and fever.  HENT: Negative.   Respiratory: Negative.   Cardiovascular: Negative.   Gastrointestinal: Positive for abdominal pain, blood in stool, diarrhea and nausea. Negative for vomiting.  Genitourinary: Negative.   Musculoskeletal: Negative.   Skin: Negative.   Neurological: Negative.   Hematological: Negative.     Physical Exam Updated Vital Signs BP 108/69   Pulse 78   Temp 97.8 F (36.6 C) (Oral)   Resp 18   SpO2 97%   Physical Exam Vitals and nursing note reviewed.  Constitutional:       Appearance: She is obese. She is not ill-appearing or toxic-appearing.  HENT:     Head: Normocephalic and atraumatic.     Nose: Nose normal.     Mouth/Throat:     Mouth: Mucous membranes are moist.     Pharynx: Oropharynx is clear. Uvula midline. No oropharyngeal exudate, posterior oropharyngeal erythema or uvula swelling.     Tonsils: No tonsillar exudate.  Eyes:     General: Lids are normal. Vision grossly intact.        Right eye: No discharge.        Left eye: No discharge.     Conjunctiva/sclera: Conjunctivae normal.     Pupils: Pupils are equal, round, and reactive to light.  Neck:     Trachea: Trachea and phonation normal.  Cardiovascular:     Rate and  Rhythm: Normal rate and regular rhythm.     Pulses: Normal pulses.     Heart sounds: Normal heart sounds. No murmur heard.   Pulmonary:     Effort: Pulmonary effort is normal. No tachypnea, bradypnea, accessory muscle usage, prolonged expiration or respiratory distress.     Breath sounds: Normal breath sounds. No wheezing or rales.  Chest:     Chest wall: No mass, lacerations, deformity, swelling, tenderness, crepitus or edema.  Abdominal:     General: Bowel sounds are normal. There is no distension.     Palpations: Abdomen is soft.     Tenderness: There is generalized abdominal tenderness and tenderness in the right lower quadrant and periumbilical area. There is no right CVA tenderness, left CVA tenderness, guarding or rebound.  Musculoskeletal:        General: No deformity.     Cervical back: Normal range of motion and neck supple. No rigidity or crepitus. No pain with movement, spinous process tenderness or muscular tenderness.     Right lower leg: No edema.     Left lower leg: No edema.  Lymphadenopathy:     Cervical: No cervical adenopathy.  Skin:    General: Skin is warm and dry.  Neurological:     General: No focal deficit present.     Mental Status: She is alert and oriented to person, place, and time. Mental  status is at baseline.     Sensory: Sensation is intact.     Motor: Motor function is intact.  Psychiatric:        Mood and Affect: Mood normal.     ED Results / Procedures / Treatments   Labs (all labs ordered are listed, but only abnormal results are displayed) Labs Reviewed  COMPREHENSIVE METABOLIC PANEL - Abnormal; Notable for the following components:      Result Value   Glucose, Bld 117 (*)    Calcium 8.6 (*)    Albumin 3.4 (*)    All other components within normal limits  CBC WITH DIFFERENTIAL/PLATELET - Abnormal; Notable for the following components:   WBC 10.9 (*)    RBC 5.14 (*)    Hemoglobin 11.4 (*)    MCV 71.4 (*)    MCH 22.2 (*)    RDW 16.2 (*)    Neutro Abs 9.0 (*)    All other components within normal limits  URINALYSIS, ROUTINE W REFLEX MICROSCOPIC - Abnormal; Notable for the following components:   APPearance CLOUDY (*)    Leukocytes,Ua LARGE (*)    WBC, UA >50 (*)    Bacteria, UA RARE (*)    Squamous Epithelial / LPF >50 (*)    All other components within normal limits  URINE CULTURE  LIPASE, BLOOD  PREGNANCY, URINE    EKG None  Radiology CT Abdomen Pelvis W Contrast  Result Date: 10/10/2020 CLINICAL DATA:  Right lower quadrant abdominal pain. Recently treated for worms. EXAM: CT ABDOMEN AND PELVIS WITH CONTRAST TECHNIQUE: Multidetector CT imaging of the abdomen and pelvis was performed using the standard protocol following bolus administration of intravenous contrast. CONTRAST:  179mL OMNIPAQUE IOHEXOL 300 MG/ML  SOLN COMPARISON:  01/31/2020 FINDINGS: Lower chest: Small amount of linear atelectasis at both lung bases. Hepatobiliary: Minimal diffuse low density of the liver. Normal appearing gallbladder. Pancreas: Unremarkable. No pancreatic ductal dilatation or surrounding inflammatory changes. Spleen: Normal in size without focal abnormality. Adrenals/Urinary Tract: Adrenal glands are unremarkable. Kidneys are normal, without renal calculi, focal  lesion, or hydronephrosis. Bladder is  unremarkable. Stomach/Bowel: Stomach is within normal limits. Appendix appears normal. No evidence of bowel wall thickening, distention, or inflammatory changes. Vascular/Lymphatic: No significant vascular findings are present. No enlarged abdominal or pelvic lymph nodes. Reproductive: Uterus and bilateral adnexa are unremarkable. Other: Small amount of free peritoneal fluid in the pelvic cul-de-sac. Musculoskeletal: Unremarkable bones. IMPRESSION: 1. Small amount of free peritoneal fluid in the pelvic cul-de-sac, most likely due to recent ovarian cyst rupture. 2. Normal appearing bowel and appendix. 3. Minimal diffuse hepatic steatosis. Electronically Signed   By: Claudie Revering M.D.   On: 10/10/2020 14:15    Procedures Procedures   Medications Ordered in ED Medications  fentaNYL (SUBLIMAZE) injection 50 mcg (50 mcg Intravenous Given 10/10/20 1239)  ondansetron (ZOFRAN) injection 4 mg (4 mg Intravenous Given 10/10/20 1236)  iohexol (OMNIPAQUE) 300 MG/ML solution 100 mL (100 mLs Intravenous Contrast Given 10/10/20 1358)    ED Course  I have reviewed the triage vital signs and the nursing notes.  Pertinent labs & imaging results that were available during my care of the patient were reviewed by me and considered in my medical decision making (see chart for details).    MDM Rules/Calculators/A&P                         25 year old female presents with persistent loose stools and right lower quadrant pain x10 days.  Differential diagnosis includes but is not limited to appendicitis, colitis, diverticulitis, IBS, IBD, ischemic colitis, mesenteric lymphadenitis, mesenteric ischemia, ovarian torsion or cyst, PID, pyelonephritis or nephrolithiasis/ureterolithiasis.  Vital signs are normal on intake.  Cardiopulmonary exam is normal, abdominal exam is significant for tenderness to palpation in the right lower quadrant as well as the left lower quadrant without Rovsing  sign.  No rebound or guarding.  Abdomen is soft.  Patient is neurovascularly intact in all 4 extremities.  No GU exam warranted given lack of pelvic/vaginal symptoms.  CBC with mild leukocytosis of 10.9, previously 11.  Anemia at baseline with hemoglobin of 11.4.  CMP unremarkable.  Lipase is normal.  UA with large amount leukocytes, WBCs, and rare bacteria's.  CT of the abdomen pelvis revealed index and small amount of free peritoneal fluid in the pelvic cul-de-sac likely related to recent ovarian cyst rupture.  No other acute findings in the abdomen or pelvis.  Patient reevaluated after administration of antiemetic and pain medication with significant provement in her pain.  No further work-up warranted in the this time.  Suspect patient's sudden onset right lower abdominal pain secondary to ruptured ovarian cyst.  Recommend she complete antibiotic therapy as prescribed for suspected colitis, diagnosed by PCP. Recommend OBGYN followup.   Liane voiced understanding of her medical evaluation and treatment plan. Each of her questions was answered to her expressed satisfaction. Return precautions given. Patient is well appearing, stable and appropriate for discharge.   This chart was dictated using voice recognition software, Dragon. Despite the best efforts of this provider to proofread and correct errors, errors may still occur which can change documentation meaning.  Final Clinical Impression(s) / ED Diagnoses Final diagnoses:  Cyst of ovary, unspecified laterality    Rx / DC Orders ED Discharge Orders    None       Aura Dials 10/10/20 1557    Fredia Sorrow, MD 10/11/20 [phone]

## 2020-10-11 ENCOUNTER — Telehealth: Payer: Self-pay

## 2020-10-11 NOTE — Telephone Encounter (Signed)
Transition Care Management Unsuccessful Follow-up Telephone Call  Date of discharge and from where:  10/10/2020 from [facility]  Attempts:  1st Attempt  Reason for unsuccessful TCM follow-up call:  Unable to reach patient

## 2020-10-12 LAB — STOOL CULTURE: E coli, Shiga toxin Assay: NEGATIVE

## 2020-10-12 LAB — URINE CULTURE

## 2020-10-12 NOTE — Telephone Encounter (Signed)
Transition Care Management Unsuccessful Follow-up Telephone Call  Date of discharge and from where:  10/10/2020 from [facility]  Attempts:  2nd Attempt  Reason for unsuccessful TCM follow-up call:  Unable to leave message

## 2020-10-13 DIAGNOSIS — Z029 Encounter for administrative examinations, unspecified: Secondary | ICD-10-CM

## 2020-10-13 NOTE — Telephone Encounter (Signed)
Transition Care Management Unsuccessful Follow-up Telephone Call  Date of discharge and from where:  10/10/2020 from [facility]  Attempts:  3rd Attempt  Reason for unsuccessful TCM follow-up call:  Unable to reach patient

## 2020-10-14 ENCOUNTER — Other Ambulatory Visit: Payer: Self-pay

## 2020-10-14 ENCOUNTER — Encounter: Payer: Self-pay | Admitting: Family Medicine

## 2020-10-14 ENCOUNTER — Ambulatory Visit (INDEPENDENT_AMBULATORY_CARE_PROVIDER_SITE_OTHER): Payer: Medicaid Other | Admitting: Family Medicine

## 2020-10-14 ENCOUNTER — Telehealth: Payer: Self-pay

## 2020-10-14 VITALS — BP 130/78 | HR 137 | Temp 97.5°F | Wt 257.0 lb

## 2020-10-14 DIAGNOSIS — D509 Iron deficiency anemia, unspecified: Secondary | ICD-10-CM | POA: Diagnosis not present

## 2020-10-14 DIAGNOSIS — R631 Polydipsia: Secondary | ICD-10-CM | POA: Insufficient documentation

## 2020-10-14 DIAGNOSIS — R5383 Other fatigue: Secondary | ICD-10-CM | POA: Diagnosis not present

## 2020-10-14 LAB — OVA AND PARASITE EXAMINATION

## 2020-10-14 NOTE — Progress Notes (Signed)
Patient ID: Stephanie Torres, female    DOB: 1996-05-31, 25 y.o.   MRN: [id number]   Chief Complaint  Patient presents with  . Abdominal Pain   Subjective:  CC; follow-up for abdominal pain  This is not a new problem.  Presents today to follow-up on abdominal pain and diarrhea.  Reports that on May 8, had severe abdominal pain went to the emergency department had CT scan which showed free fluid in the peritoneum which reflected a recent ruptured ovarian cyst.  She reports that her pain is much better describes it as cramping much like a menstrual cycle.  Has been using ibuprofen for this cramping.  Reports that she is still having to soft stools per day, has not seen any parasites.  Denies any blood in her stools.  Stool for O&P and culture results will be reviewed in detail today at this visit.  Reports fatigue, no menstrual cycle in 6 years.  Pt here for follow up on abdominal pain. Pt went to ER on 10/10/20 for abdominal pain and she had an ovarian cyst burst. Pt states on CT scan it says something about linear atelectasis at her lungs. Pt has been using mom inhaler (Advair diskus) and that helps her breathing. Pt states she is still having diarrhea.     Medical History Stephanie Torres has a past medical history of Anxiety, Asthma, Depression, DUB (dysfunctional uterine bleeding), Heartburn, and PCOS (polycystic ovarian syndrome).   Outpatient Encounter Medications as of 10/14/2020  Medication Sig  . albuterol (PROVENTIL) (2.5 MG/3ML) 0.083% nebulizer solution Take 3 mLs (2.5 mg total) by nebulization every 6 (six) hours as needed for wheezing or shortness of breath.  Marland Kitchen albuterol (VENTOLIN HFA) 108 (90 Base) MCG/ACT inhaler Inhale 2 puffs into the lungs every 4 (four) hours as needed for wheezing or shortness of breath.  Marland Kitchen amoxicillin-clavulanate (AUGMENTIN) 875-125 MG tablet Take 1 tablet by mouth 2 (two) times daily.  . ondansetron (ZOFRAN ODT) 8 MG disintegrating tablet Take 1 tablet (8 mg  total) by mouth every 8 (eight) hours as needed for nausea.  . citalopram (CELEXA) 20 MG tablet Take 1 tablet (20 mg total) by mouth daily. (Patient not taking: No sig reported)   No facility-administered encounter medications on file as of 10/14/2020.     Review of Systems  Constitutional: Positive for fatigue. Negative for chills and fever.  Respiratory: Negative for shortness of breath.   Cardiovascular: Negative for chest pain.  Gastrointestinal: Positive for abdominal pain (improving), diarrhea (soft, not liquid) and nausea. Negative for blood in stool and vomiting.  Neurological: Negative for dizziness, light-headedness and headaches (migraine yesterday, took Excedrin).     Vitals BP 130/78   Pulse (!) 137   Temp (!) 97.5 F (36.4 C)   Wt 257 lb (116.6 kg)   SpO2 99%   BMI 44.11 kg/m   Objective:   Physical Exam Vitals and nursing note reviewed.  Constitutional:      Appearance: Normal appearance.  Cardiovascular:     Rate and Rhythm: Normal rate and regular rhythm.     Heart sounds: Normal heart sounds.  Pulmonary:     Effort: Pulmonary effort is normal.     Breath sounds: Normal breath sounds.  Skin:    General: Skin is warm and dry.  Neurological:     General: No focal deficit present.     Mental Status: She is alert.  Psychiatric:        Behavior: Behavior normal.  Assessment and Plan   1. Microcytic anemia - Iron - Iron Binding Cap (TIBC)([facility]) - Ferritin, Serum (Serial) - B12 and Folate Panel  2. Polydipsia - HgB A1c   Labs reviewed in detail, CBC reveals microcytic anemia, will get iron studies, B12 and folate and   hemoglobin A1c today due to increased thirst.  Her abdominal pain is much improved, O&P  and stool culture are negative.  She will return to work this Saturday, May 14, work note given.  We will complete FMLA paperwork for her today.  Agrees with plan of care discussed today. Understands warning signs to seek  further care: chest pain, shortness of breath, any significant change in health.  Understands to follow-up if symptoms return.  Will notify once lab results are available, it is likely that she has iron deficiency anemia, iron supplementation will be ordered at that time if this is verified.  She reports that she has not had a menstrual cycle in 6 years.   Stephanie Guest, NP 10/14/2020

## 2020-10-14 NOTE — Telephone Encounter (Signed)
Patient faxed over FMLA to be completed in your box .

## 2020-10-14 NOTE — Patient Instructions (Signed)
[employer] medicine (25th ed., pp. [phone]). [place], [place]: Elsevier.">  Anemia  Anemia is a condition in which there is not enough red blood cells or hemoglobin in the blood. Hemoglobin is a substance in red blood cells that carries oxygen. When you do not have enough red blood cells or hemoglobin (are anemic), your body cannot get enough oxygen and your organs may not work properly. As a result, you may feel very tired or have other problems. What are the causes? Common causes of anemia include:  Excessive bleeding. Anemia can be caused by excessive bleeding inside or outside the body, including bleeding from the intestines or from heavy menstrual periods in females.  Poor nutrition.  Long-lasting (chronic) kidney, thyroid, and liver disease.  Bone marrow disorders, spleen problems, and blood disorders.  Cancer and treatments for cancer.  HIV (human immunodeficiency virus) and AIDS (acquired immunodeficiency syndrome).  Infections, medicines, and autoimmune disorders that destroy red blood cells. What are the signs or symptoms? Symptoms of this condition include:  Minor weakness.  Dizziness.  Headache, or difficulties concentrating and sleeping.  Heartbeats that feel irregular or faster than normal (palpitations).  Shortness of breath, especially with exercise.  Pale skin, lips, and nails, or cold hands and feet.  Indigestion and nausea. Symptoms may occur suddenly or develop slowly. If your anemia is mild, you may not have symptoms. How is this diagnosed? This condition is diagnosed based on blood tests, your medical history, and a physical exam. In some cases, a test may be needed in which cells are removed from the soft tissue inside of a bone and looked at under a microscope (bone marrow biopsy). Your health care provider may also check your stool (feces) for blood and may do additional testing to look for the cause of your bleeding. Other tests may  include:  Imaging tests, such as a CT scan or MRI.  A procedure to see inside your esophagus and stomach (endoscopy).  A procedure to see inside your colon and rectum (colonoscopy). How is this treated? Treatment for this condition depends on the cause. If you continue to lose a lot of blood, you may need to be treated at a hospital. Treatment may include:  Taking supplements of iron, vitamin Q68, or folic acid.  Taking a hormone medicine (erythropoietin) that can help to stimulate red blood cell growth.  Having a blood transfusion. This may be needed if you lose a lot of blood.  Making changes to your diet.  Having surgery to remove your spleen. Follow these instructions at home:  Take over-the-counter and prescription medicines only as told by your health care provider.  Take supplements only as told by your health care provider.  Follow any diet instructions that you were given by your health care provider.  Keep all follow-up visits as told by your health care provider. This is important. Contact a health care provider if:  You develop new bleeding anywhere in the body. Get help right away if:  You are very weak.  You are short of breath.  You have pain in your abdomen or chest.  You are dizzy or feel faint.  You have trouble concentrating.  You have bloody stools, black stools, or tarry stools.  You vomit repeatedly or you vomit up blood. These symptoms may represent a serious problem that is an emergency. Do not wait to see if the symptoms will go away. Get medical help right away. Call your local emergency services (911 in the [place] Do not  drive yourself to the hospital. Summary  Anemia is a condition in which you do not have enough red blood cells or enough of a substance in your red blood cells that carries oxygen (hemoglobin).  Symptoms may occur suddenly or develop slowly.  If your anemia is mild, you may not have symptoms.  This condition is  diagnosed with blood tests, a medical history, and a physical exam. Other tests may be needed.  Treatment for this condition depends on the cause of the anemia. This information is not intended to replace advice given to you by your health care provider. Make sure you discuss any questions you have with your health care provider. Document Revised: 04/29/2019 Document Reviewed: 04/29/2019 Elsevier Patient Education  2021 Elsevier Inc.  

## 2020-10-15 LAB — IRON AND TIBC
Iron Saturation: 7 % — CL (ref 15–55)
Iron: 24 ug/dL — ABNORMAL LOW (ref 27–159)
Total Iron Binding Capacity: 350 ug/dL (ref 250–450)
UIBC: 326 ug/dL (ref 131–425)

## 2020-10-15 LAB — FERRITIN, SERUM (SERIAL): Ferritin: 18 ng/mL (ref 15–150)

## 2020-10-15 LAB — HEMOGLOBIN A1C
Est. average glucose Bld gHb Est-mCnc: 117 mg/dL
Hgb A1c MFr Bld: 5.7 % — ABNORMAL HIGH (ref 4.8–5.6)

## 2020-10-15 LAB — B12 AND FOLATE PANEL
Folate: 2.8 ng/mL — ABNORMAL LOW (ref >3.0)
Vitamin B-12: 502 pg/mL (ref 232–1245)

## 2020-10-15 NOTE — Telephone Encounter (Signed)
Patient paper are done and upfront for pick up

## 2020-10-19 ENCOUNTER — Other Ambulatory Visit: Payer: Self-pay | Admitting: Family Medicine

## 2020-10-19 DIAGNOSIS — E538 Deficiency of other specified B group vitamins: Secondary | ICD-10-CM | POA: Insufficient documentation

## 2020-10-19 DIAGNOSIS — D509 Iron deficiency anemia, unspecified: Secondary | ICD-10-CM

## 2020-10-19 MED ORDER — DOCUSATE SODIUM 100 MG PO CAPS: 100.0000 mg | ORAL_CAPSULE | Freq: Two times a day (BID) | ORAL | 0 refills | Status: DC

## 2020-10-19 MED ORDER — IRON (FERROUS SULFATE) 325 (65 FE) MG PO TABS: 325.0000 mg | ORAL_TABLET | Freq: Every day | ORAL | 0 refills | Status: DC

## 2020-10-19 MED ORDER — FOLIC ACID 800 MCG PO TABS: 800.0000 ug | ORAL_TABLET | Freq: Every day | ORAL | 0 refills | Status: DC

## 2020-10-19 NOTE — Progress Notes (Signed)
Iron deficiency anemia and low folate levels.

## 2020-11-26 ENCOUNTER — Encounter: Payer: Self-pay | Admitting: Family Medicine

## 2020-12-17 ENCOUNTER — Other Ambulatory Visit: Payer: Self-pay | Admitting: Nurse Practitioner

## 2020-12-28 IMAGING — CT CT RENAL STONE PROTOCOL
2 of 4 series · 16 of 46 positions shown, 18 images · non-contrast
Comparison: Prior CT from 01/05/2020.

CLINICAL DATA: Initial evaluation for acute flank pain, stone
disease suspected.

EXAM:
CT ABDOMEN AND PELVIS WITHOUT CONTRAST
TECHNIQUE: Multidetector CT imaging of the abdomen and pelvis was performed
following the standard protocol without IV contrast.

[Series 2: axial st · axial · 0.91mm/px · z∈[-803,-338]mm · 13 of 105 slices shown, 15 images]
[im 6/105  soft-tissue]
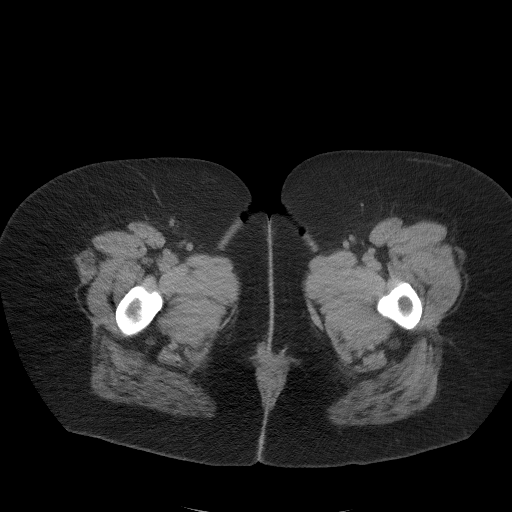
[im 6/105  bone]
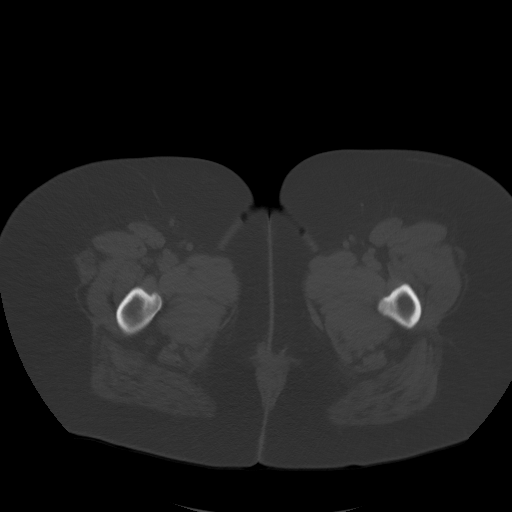
[im 12/105  soft-tissue]
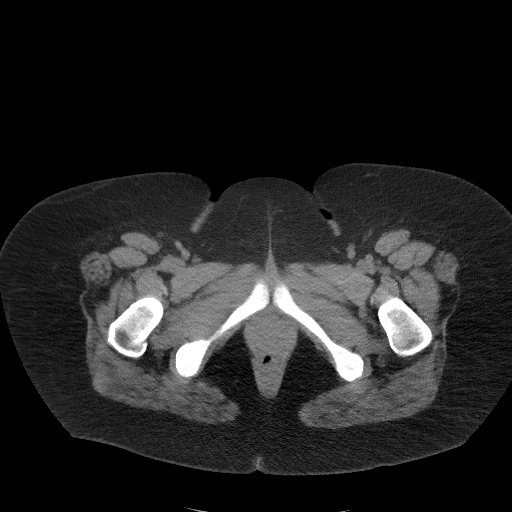
[im 24/105  soft-tissue]
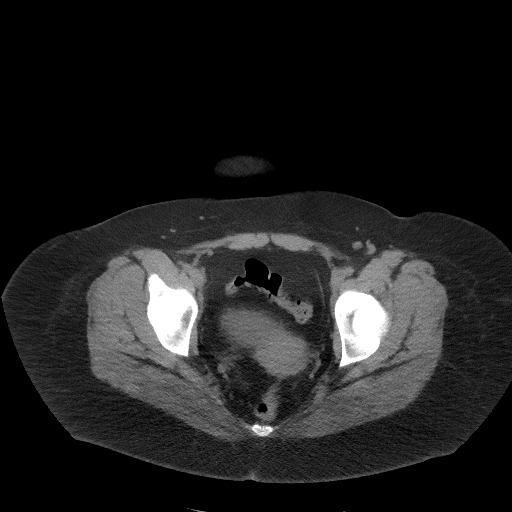
[im 29/105  soft-tissue]
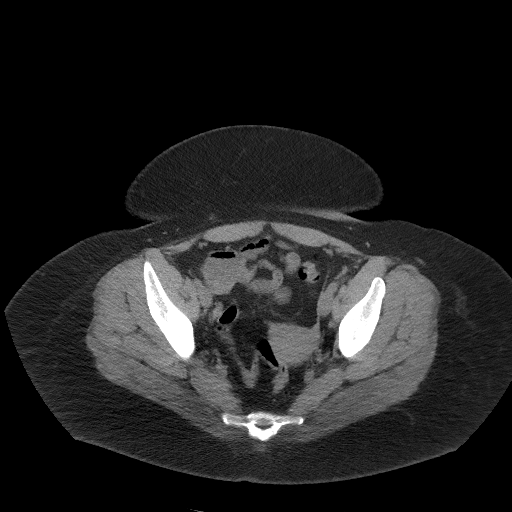
[im 35/105  soft-tissue]
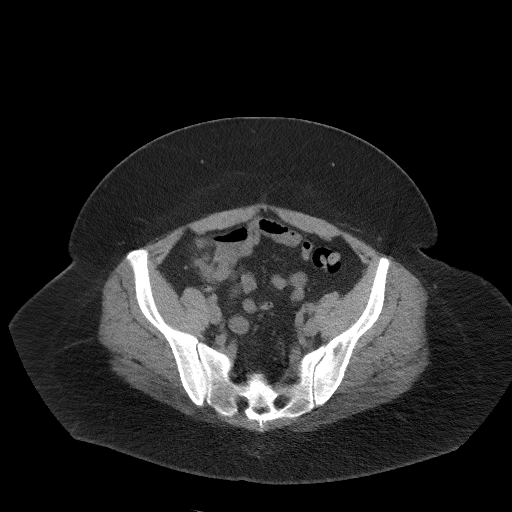
[im 47/105  soft-tissue]
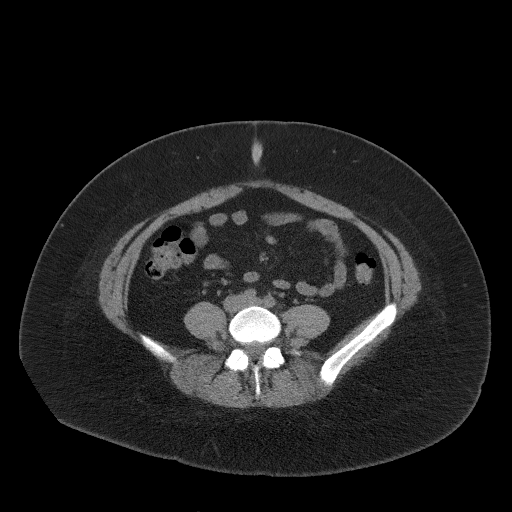
[im 53/105  soft-tissue]
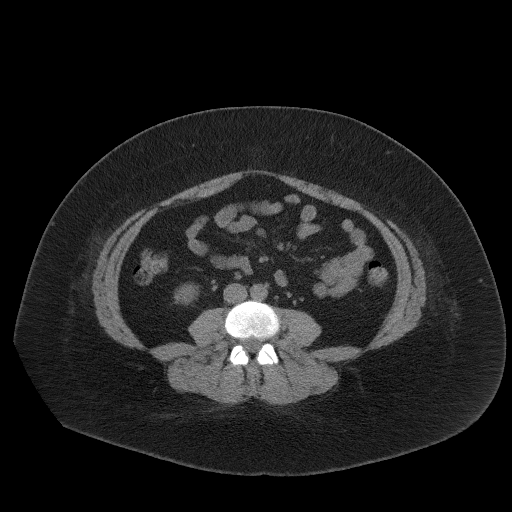
[im 58/105  soft-tissue]
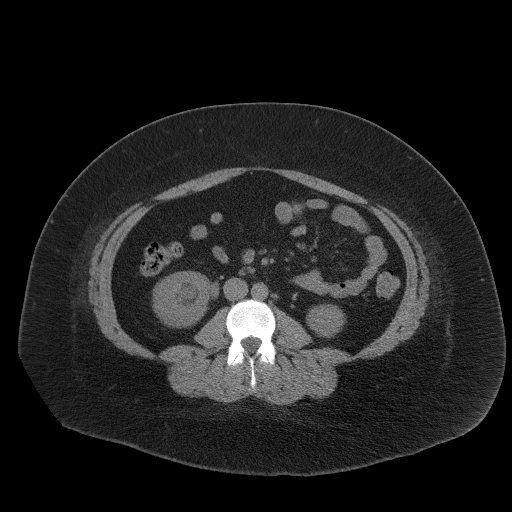
[im 70/105  soft-tissue]
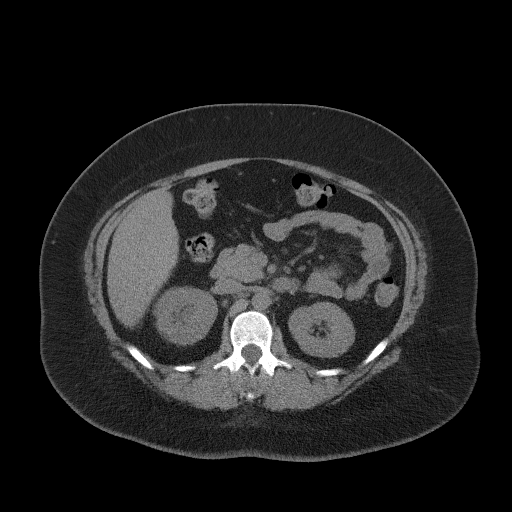
[im 70/105  bone]
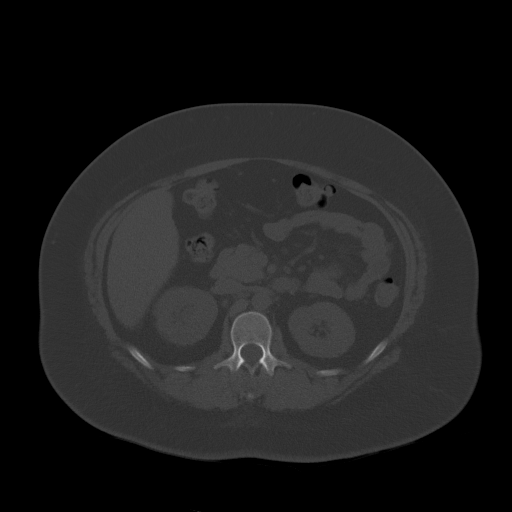
[im 76/105  soft-tissue]
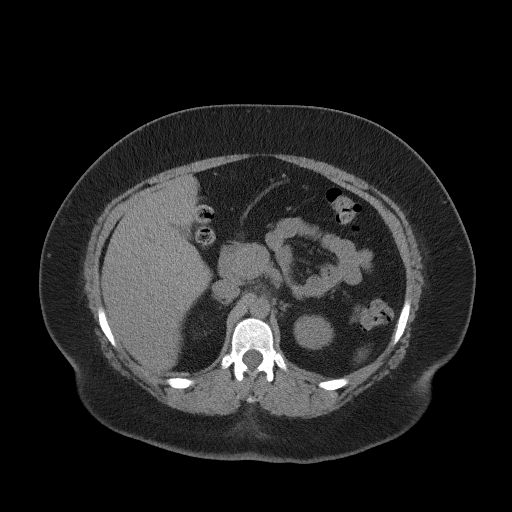
[im 81/105  soft-tissue]
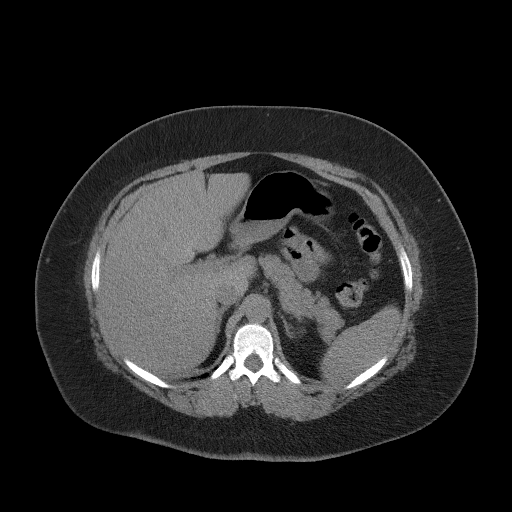
[im 93/105  soft-tissue]
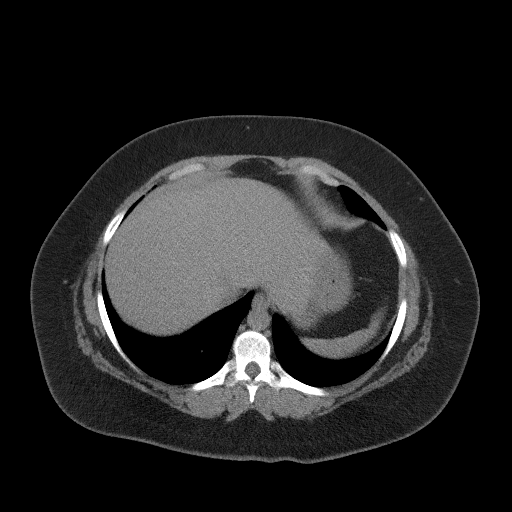
[im 99/105  soft-tissue]
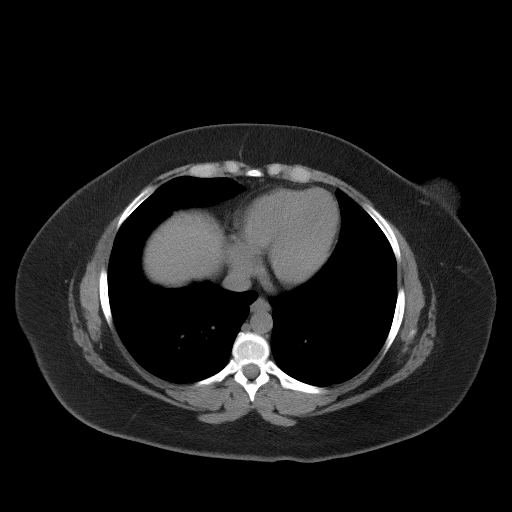

[Series 5: coronal st · coronal · 0.90mm/px · 3 of 106 slices shown]
[im 36/106  soft-tissue]
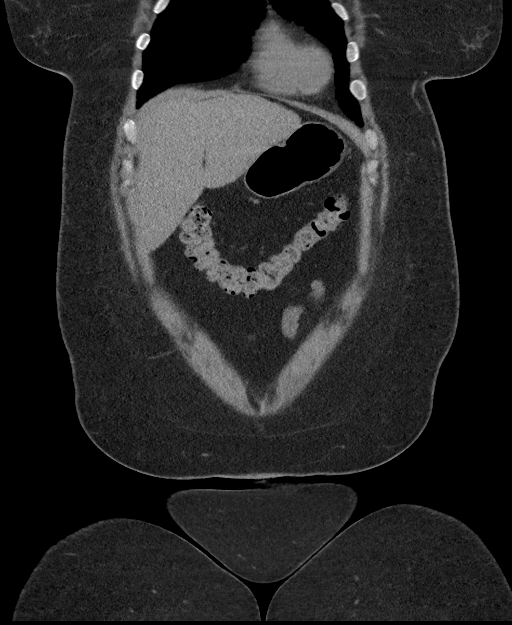
[im 47/106  soft-tissue]
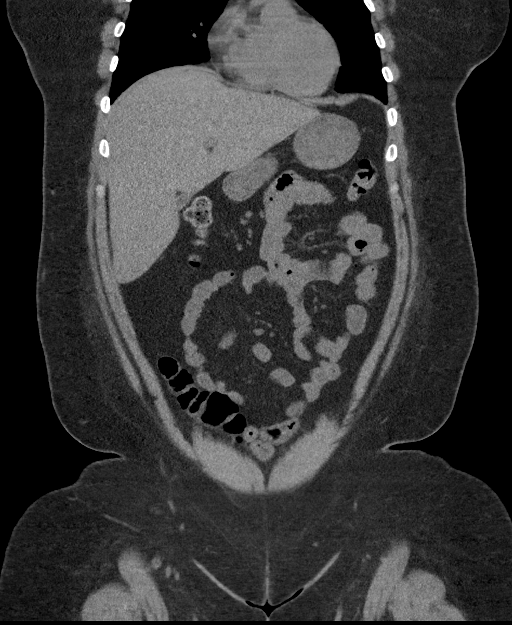
[im 59/106  soft-tissue]
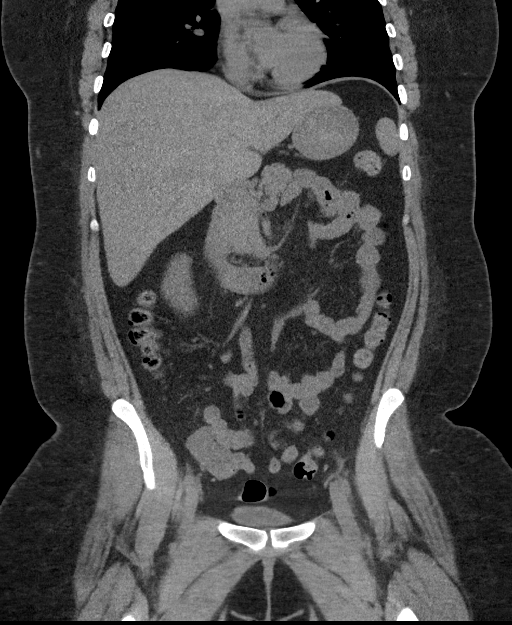

[16 of 46 positions shown; findings below may reference images not displayed]

FINDINGS: Lower chest: Minimal subsegmental atelectatic changes seen within
the left lung base. Visualized lungs are otherwise clear.

Hepatobiliary: Limited noncontrast evaluation of the liver is
unremarkable. Gallbladder contracted without acute abnormality. No
biliary dilatation.

Pancreas: Pancreas within normal limits.

Spleen: Spleen within normal limits.

Adrenals/Urinary Tract: Adrenal glands are normal.

Left kidney unremarkable without nephrolithiasis or hydronephrosis.
On the right, there is an 12 mm obstructive stone positioned at the
right renal pelvis with secondary moderate right hydronephrosis.
Associated mild perinephric and periureteral fat stranding. No other
radiopaque calculi seen within the right kidney. Right ureter
decompressed distally with no other additional stones identified.
Partially distended bladder within normal limits. No layering stones
within the bladder lumen.

Stomach/Bowel: Stomach within normal limits. No evidence for bowel
obstruction. No acute inflammatory changes seen about the bowels.
Appendix within normal limits.

Vascular/Lymphatic: Intra-abdominal aorta of normal caliber. No
adenopathy.

Reproductive: Uterus and ovaries within normal limits.

Other: No free air or fluid.

Musculoskeletal: No acute osseous abnormality. No discrete or
worrisome osseous lesions.
IMPRESSION: 1. 12 mm obstructive stone at the right renal pelvis with secondary
moderate right hydronephrosis. Overall, appearance is relatively
unchanged as compared to 01/05/2020.
2. No other acute intra-abdominal or pelvic process.

## 2021-01-09 ENCOUNTER — Other Ambulatory Visit: Payer: Self-pay

## 2021-01-09 ENCOUNTER — Encounter (HOSPITAL_COMMUNITY): Payer: Self-pay

## 2021-01-09 ENCOUNTER — Emergency Department (HOSPITAL_COMMUNITY)
Admission: EM | Admit: 2021-01-09 | Discharge: 2021-01-09 | Disposition: A | Discharge status: Home / Self Care | Payer: Medicaid Other | Attending: Emergency Medicine | Admitting: Emergency Medicine

## 2021-01-09 DIAGNOSIS — F419 Anxiety disorder, unspecified: Secondary | ICD-10-CM | POA: Insufficient documentation

## 2021-01-09 DIAGNOSIS — Z5321 Procedure and treatment not carried out due to patient leaving prior to being seen by health care provider: Secondary | ICD-10-CM | POA: Insufficient documentation

## 2021-01-09 NOTE — ED Triage Notes (Addendum)
Pt reports having anxiety, says she had one at work tonight. Pt reports taking Celexa for her anxiety. Pt feels like it isn't working anymore. Pt says she IS HERE FOR A WORK NOTE BECAUSE SHE NEEDS TO BE OFF WORK FOR A COUPLE OF DAYS

## 2021-01-10 ENCOUNTER — Telehealth: Payer: Self-pay

## 2021-01-10 NOTE — Telephone Encounter (Signed)
Transition Care Management Unsuccessful Follow-up Telephone Call  Date of discharge and from where:  01/09/2021[facility]   Attempts:  1st Attempt  Reason for unsuccessful TCM follow-up call:  Unable to leave message

## 2021-01-11 NOTE — Telephone Encounter (Signed)
Transition Care Management Unsuccessful Follow-up Telephone Call  Date of discharge and from where:  01/09/2021[facility]   Attempts:  2nd Attempt  Reason for unsuccessful TCM follow-up call:  Unable to leave message

## 2021-01-12 NOTE — Telephone Encounter (Signed)
Transition Care Management Unsuccessful Follow-up Telephone Call  Date of discharge and from where:  01/09/2021 from [facility]  Attempts:  3rd Attempt  Reason for unsuccessful TCM follow-up call:  Unable to reach patient

## 2021-01-15 DIAGNOSIS — O99511 Diseases of the respiratory system complicating pregnancy, first trimester: Secondary | ICD-10-CM | POA: Diagnosis not present

## 2021-01-15 DIAGNOSIS — O99891 Other specified diseases and conditions complicating pregnancy: Secondary | ICD-10-CM | POA: Diagnosis not present

## 2021-01-15 DIAGNOSIS — R0602 Shortness of breath: Secondary | ICD-10-CM | POA: Diagnosis not present

## 2021-01-15 DIAGNOSIS — Z3A01 Less than 8 weeks gestation of pregnancy: Secondary | ICD-10-CM | POA: Diagnosis not present

## 2021-01-15 DIAGNOSIS — J45901 Unspecified asthma with (acute) exacerbation: Secondary | ICD-10-CM | POA: Diagnosis not present

## 2021-01-17 ENCOUNTER — Telehealth: Payer: Self-pay

## 2021-01-17 NOTE — Telephone Encounter (Signed)
Transition Care Management Unsuccessful Follow-up Telephone Call  Date of discharge and from where:  01/16/2021-[facility]   Attempts:  1st Attempt  Reason for unsuccessful TCM follow-up call:  Unable to leave message

## 2021-01-18 NOTE — Telephone Encounter (Signed)
Transition Care Management Unsuccessful Follow-up Telephone Call  Date of discharge and from where:  01/16/2021-[facility]   Attempts:  2nd Attempt  Reason for unsuccessful TCM follow-up call:  Unable to leave message

## 2021-01-19 NOTE — Telephone Encounter (Signed)
Transition Care Management Unsuccessful Follow-up Telephone Call  Date of discharge and from where:  01/16/2021-[facility]   Attempts:  3rd Attempt  Reason for unsuccessful TCM follow-up call:  Unable to leave message

## 2021-01-21 ENCOUNTER — Ambulatory Visit: Payer: Medicaid Other | Admitting: Nurse Practitioner

## 2021-01-21 ENCOUNTER — Other Ambulatory Visit: Payer: Self-pay

## 2021-01-21 VITALS — BP 132/86 | HR 101 | Temp 98.6°F | Ht 64.0 in | Wt 261.0 lb

## 2021-01-21 DIAGNOSIS — F32A Depression, unspecified: Secondary | ICD-10-CM | POA: Diagnosis not present

## 2021-01-21 DIAGNOSIS — H6501 Acute serous otitis media, right ear: Secondary | ICD-10-CM

## 2021-01-21 DIAGNOSIS — Z3201 Encounter for pregnancy test, result positive: Secondary | ICD-10-CM | POA: Diagnosis not present

## 2021-01-21 DIAGNOSIS — J4541 Moderate persistent asthma with (acute) exacerbation: Secondary | ICD-10-CM | POA: Diagnosis not present

## 2021-01-21 DIAGNOSIS — F419 Anxiety disorder, unspecified: Secondary | ICD-10-CM | POA: Diagnosis not present

## 2021-01-21 DIAGNOSIS — J310 Chronic rhinitis: Secondary | ICD-10-CM

## 2021-01-21 LAB — POCT URINE PREGNANCY: Preg Test, Ur: POSITIVE — AB

## 2021-01-21 MED ORDER — ESCITALOPRAM OXALATE 10 MG PO TABS: 10.0000 mg | ORAL_TABLET | Freq: Every day | ORAL | 0 refills | Status: DC

## 2021-01-21 MED ORDER — BUDESONIDE-FORMOTEROL FUMARATE 80-4.5 MCG/ACT IN AERO
2.0000 | INHALATION_SPRAY | Freq: Two times a day (BID) | RESPIRATORY_TRACT | 2 refills | Status: DC
Start: 2021-01-21 — End: 2021-02-04

## 2021-01-21 NOTE — Progress Notes (Signed)
p  Subjective:    Patient ID: Stephanie Torres, female    DOB: 1996/01/16, 25 y.o.   MRN: [id number]  Asthma Her past medical history is significant for asthma.  Recent urgent care visit for asthma flare   Positive home pregnancy test  Anxiety / depression Last menstrual cycle was 7/3.  Has had positive home pregnancy test, would like a urine pregnancy test today to help verify.  Has an appointment with her obstetrician on 9/1.  Was seen in [facility] ED on 01/15/2021 for an asthma exacerbation.  Took a few days of prednisone.  Still experiencing some trouble with her asthma.  Last use her albuterol inhaler about 330 this morning.  No fever.  Right ear pain and pressure.  Occasional mild sore throat.  Sinus pressure much improved.  Minimal cough.  COVID test has been negative.  Taking fluids well.  Voiding normal limit.  Has been using her albuterol inhaler about 3 times per day, avoids using her nebulizer due to jumpiness.  This does help her wheezing.  Has been dealing with asthma and allergy symptoms over the past month.  Is not taking a daily preventive inhaler at this time.  Slight relief with Mucinex. Patient is also here regarding her anxiety and depression.  Feeling overwhelmed.  Denies suicidal homicidal thoughts or ideation but did have some issues weeks ago where she felt completely overwhelmed.  Ran out of her Celexa a while back. Depression screen PHQ 2/9 01/21/2021  Decreased Interest 2  Down, Depressed, Hopeless 2  PHQ - 2 Score 4  Altered sleeping 3  Tired, decreased energy 3  Change in appetite 3  Feeling bad or failure about yourself  3  Trouble concentrating 3  Moving slowly or fidgety/restless 3  Suicidal thoughts 1  PHQ-9 Score 23  Difficult doing work/chores Very difficult  Some recent data might be hidden   GAD 7 : Generalized Anxiety Score 01/21/2021 10/08/2020 02/03/2020 02/28/2019  Nervous, Anxious, on Edge '3 3 3 1  '$ Control/stop worrying '3 3 3 3  '$ Worry too much - different  things '3 3 3 3  '$ Trouble relaxing '3 3 3 1  '$ Restless '3 1 1 '$ 0  Easily annoyed or irritable 3 0 3 3  Afraid - awful might happen 3 0 3 1  Total GAD 7 Score '21 13 19 12  '$ Anxiety Difficulty Very difficult Not difficult at all Extremely difficult Very difficult          Objective:   Physical Exam NAD.  Alert, oriented.  Mildly anxious affect.  Tearing at times.  Making good eye contact.  Dressed appropriately.  Speech clear.  Thoughts logical coherent and relevant.  Left TM minimal clear effusion.  Right TM retracted, no erythema.  Pharynx nonerythematous with cloudy PND noted.  Neck supple with mild soft anterior adenopathy.  Lungs clear.  No tachypnea.  Heart regular rate and rhythm.  Today's Vitals   01/21/21 0938  BP: 132/86  Pulse: (!) 101  Temp: 98.6 F (37 C)  SpO2: 98%  Weight: 261 lb (118.4 kg)  Height: '5\' 4"'$  (1.626 m)   Body mass index is 44.8 kg/m. Results for orders placed or performed in visit on 01/21/21  POCT urine pregnancy  Result Value Ref Range   Preg Test, Ur Positive (A) Negative        Assessment & Plan:   Problem List Items Addressed This Visit       Respiratory   Asthma with acute exacerbation -  Primary   Relevant Medications   albuterol (VENTOLIN HFA) 108 (90 Base) MCG/ACT inhaler   budesonide-formoterol (SYMBICORT) 80-4.5 MCG/ACT inhaler     Other   Anxiety and depression   Relevant Medications   escitalopram (LEXAPRO) 10 MG tablet   Other Visit Diagnoses     Positive pregnancy test       Relevant Orders   POCT urine pregnancy (Completed)   Mixed rhinitis       Non-recurrent acute serous otitis media of right ear            Meds ordered this encounter  Medications   escitalopram (LEXAPRO) 10 MG tablet    Sig: Take 1 tablet (10 mg total) by mouth daily.    Dispense:  30 tablet    Refill:  0    Order Specific Question:   Supervising Provider    Answer:   Sallee Lange A [[id number]]   budesonide-formoterol (SYMBICORT) 80-4.5 MCG/ACT  inhaler    Sig: Inhale 2 puffs into the lungs 2 (two) times daily. To prevent wheezing    Dispense:  10.2 g    Refill:  2    Order Specific Question:   Supervising Provider    Answer:   Sallee Lange A [[id number]]   Switch to escitalopram since this is preferred during pregnancy.  We will start with low-dose and plan to titrate to 20 mg if needed.  Warning signs reviewed including suicidality.  Stop medication and contact office if any problems. Lengthy discussion regarding her asthma and the need for a daily preventive inhaler.  Start Symbicort as directed.  Rinse mouth out afterwards.  Our goal is for her to use minimal albuterol inhaler.  Restart Flonase as directed.  Call back if symptoms worsen or persist. Return for Visiti in 2-3 weeks before Dr. Dorthula Matas; phone or virtual is fine.

## 2021-01-22 ENCOUNTER — Encounter: Payer: Self-pay | Admitting: Nurse Practitioner

## 2021-02-03 DIAGNOSIS — O3680X0 Pregnancy with inconclusive fetal viability, not applicable or unspecified: Secondary | ICD-10-CM | POA: Diagnosis not present

## 2021-02-03 DIAGNOSIS — N912 Amenorrhea, unspecified: Secondary | ICD-10-CM | POA: Diagnosis not present

## 2021-02-04 ENCOUNTER — Ambulatory Visit (INDEPENDENT_AMBULATORY_CARE_PROVIDER_SITE_OTHER): Payer: Medicaid Other | Admitting: Nurse Practitioner

## 2021-02-04 ENCOUNTER — Telehealth: Payer: Self-pay

## 2021-02-04 ENCOUNTER — Encounter: Payer: Self-pay | Admitting: Family Medicine

## 2021-02-04 DIAGNOSIS — F419 Anxiety disorder, unspecified: Secondary | ICD-10-CM | POA: Diagnosis not present

## 2021-02-04 DIAGNOSIS — F32A Depression, unspecified: Secondary | ICD-10-CM

## 2021-02-04 NOTE — Progress Notes (Deleted)
  I connected with  Stephanie Torres on 02/04/21 by a video enabled telemedicine application and verified that I am speaking with the correct person using two identifiers.   I discussed the limitations of evaluation and management by telemedicine. The patient expressed understanding and agreed to proceed.  Patient location: home  Provider location: in office  I provided *** minutes of non face - to - face time during this encounter.  Subjective:    Patient ID: Stephanie Torres, female    DOB: 03/29/1996, 25 y.o.   MRN: [id number]  HPI  Follow up on anxiety and depression medication   Nausea  and vomiting in pregnancy  Review of Systems     Objective:   Physical Exam        Assessment & Plan:

## 2021-02-04 NOTE — Telephone Encounter (Signed)
I connected with  Stephanie Torres on 02/04/21 by a video enabled telemedicine application and verified that I am speaking with the correct person using two identifiers.   I discussed the limitations of evaluation and management by telemedicine. The patient expressed understanding and agreed to proceed.

## 2021-02-04 NOTE — Progress Notes (Signed)
Patient ID: Stephanie Torres, female   DOB: 08/23/1995, 25 y.o.   MRN: [id number] Virtual Visit via Telephone Note  I connected with Stephanie Torres on 02/04/21 at 11:00 AM EDT by telephone and verified that I am speaking with the correct person using two identifiers.  Location: Patient: home Provider: office   I discussed the limitations, risks, security and privacy concerns of performing an evaluation and management service by telephone and the availability of in person appointments. I also discussed with the patient that there may be a patient responsible charge related to this service. The patient expressed understanding and agreed to proceed.   History of Present Illness: Improved on Lexapro. Out of work for a couple of days due to nausea. Patient has had her first visit with obstetrician.  States she is a little earlier in her pregnancy but the estimated period has a follow-up ointment on 9/15.  Was seen slight improvement on Lexapro but is still experiencing significant anxiety.  Denies suicidal or homicidal thoughts or ideation. GAD 7 : Generalized Anxiety Score 02/04/2021 01/21/2021 10/08/2020 02/03/2020  Nervous, Anxious, on Edge '3 3 3 3  '$ Control/stop worrying '3 3 3 3  '$ Worry too much - different things '3 3 3 3  '$ Trouble relaxing '3 3 3 3  '$ Restless '2 3 1 1  '$ Easily annoyed or irritable 3 3 0 3  Afraid - awful might happen 0 3 0 3  Total GAD 7 Score '17 21 13 19  '$ Anxiety Difficulty Very difficult Very difficult Not difficult at all Extremely difficult         Observations/Objective: Today's visit was via telephone Physical exam was not possible for this visit Alert, oriented.  Speech clear.  No obvious distress.  Assessment and Plan: Problem List Items Addressed This Visit       Other   Anxiety and depression - Primary    Follow Up Instructions: Discussed options with patient.  She will continue the Lexapro 10 mg for now and discuss further with her obstetrician on 9/15.   Patient to seek help immediately if any worsening symptoms such as suicidal thoughts or ideation. Return in about 3 months (around 05/06/2021).    I discussed the assessment and treatment plan with the patient. The patient was provided an opportunity to ask questions and all were answered. The patient agreed with the plan and demonstrated an understanding of the instructions.   The patient was advised to call back or seek an in-person evaluation if the symptoms worsen or if the condition fails to improve as anticipated.  I provided 15 minutes of non-face-to-face time during this encounter.   Nilda Simmer, NP

## 2021-02-05 ENCOUNTER — Encounter: Payer: Self-pay | Admitting: Nurse Practitioner

## 2021-02-09 ENCOUNTER — Other Ambulatory Visit: Payer: Self-pay | Admitting: *Deleted

## 2021-02-09 ENCOUNTER — Other Ambulatory Visit: Payer: Self-pay

## 2021-02-09 ENCOUNTER — Other Ambulatory Visit: Payer: Medicaid Other

## 2021-02-09 DIAGNOSIS — Z111 Encounter for screening for respiratory tuberculosis: Secondary | ICD-10-CM

## 2021-02-09 NOTE — Progress Notes (Signed)
Quant tb

## 2021-02-10 ENCOUNTER — Encounter: Payer: Self-pay | Admitting: Nurse Practitioner

## 2021-02-12 LAB — QUANTIFERON-TB GOLD PLUS
QuantiFERON Mitogen Value: >10 IU/mL
QuantiFERON Nil Value: 0.02 IU/mL
QuantiFERON TB1 Ag Value: 0.09 IU/mL
QuantiFERON TB2 Ag Value: 0.01 IU/mL
QuantiFERON-TB Gold Plus: NEGATIVE

## 2021-02-14 ENCOUNTER — Encounter: Payer: Self-pay | Admitting: Nurse Practitioner

## 2021-02-14 ENCOUNTER — Telehealth: Payer: Self-pay | Admitting: Family Medicine

## 2021-02-14 NOTE — Telephone Encounter (Signed)
Patient brought in form to be completed in nurse's box

## 2021-02-14 NOTE — Telephone Encounter (Signed)
Form in provider office. Please advise. Thank you. 

## 2021-02-17 DIAGNOSIS — Z3A01 Less than 8 weeks gestation of pregnancy: Secondary | ICD-10-CM | POA: Diagnosis not present

## 2021-02-17 DIAGNOSIS — Z3689 Encounter for other specified antenatal screening: Secondary | ICD-10-CM | POA: Diagnosis not present

## 2021-02-17 DIAGNOSIS — O3680X0 Pregnancy with inconclusive fetal viability, not applicable or unspecified: Secondary | ICD-10-CM | POA: Diagnosis not present

## 2021-02-17 DIAGNOSIS — O368390 Maternal care for abnormalities of the fetal heart rate or rhythm, unspecified trimester, not applicable or unspecified: Secondary | ICD-10-CM | POA: Diagnosis not present

## 2021-02-20 DIAGNOSIS — Z3A01 Less than 8 weeks gestation of pregnancy: Secondary | ICD-10-CM | POA: Diagnosis not present

## 2021-02-20 DIAGNOSIS — O209 Hemorrhage in early pregnancy, unspecified: Secondary | ICD-10-CM | POA: Diagnosis not present

## 2021-02-20 DIAGNOSIS — O208 Other hemorrhage in early pregnancy: Secondary | ICD-10-CM | POA: Diagnosis not present

## 2021-02-21 ENCOUNTER — Telehealth: Payer: Self-pay

## 2021-02-21 DIAGNOSIS — O021 Missed abortion: Secondary | ICD-10-CM | POA: Diagnosis not present

## 2021-02-21 DIAGNOSIS — O368390 Maternal care for abnormalities of the fetal heart rate or rhythm, unspecified trimester, not applicable or unspecified: Secondary | ICD-10-CM | POA: Diagnosis not present

## 2021-02-21 NOTE — Telephone Encounter (Signed)
Transition Care Management Unsuccessful Follow-up Telephone Call  Date of discharge and from where:  02/20/2021-[facility]   Attempts:  1st Attempt  Reason for unsuccessful TCM follow-up call:  Unable to leave message

## 2021-02-22 NOTE — Telephone Encounter (Signed)
Transition Care Management Unsuccessful Follow-up Telephone Call  Date of discharge and from where:  02/20/2021[facility]  Attempts:  2nd Attempt  Reason for unsuccessful TCM follow-up call:  Unable to leave message

## 2021-02-24 NOTE — Telephone Encounter (Signed)
Transition Care Management Unsuccessful Follow-up Telephone Call  Date of discharge and from where:  02/20/2021 from [facility]  Attempts:  3rd Attempt  Reason for unsuccessful TCM follow-up call:  Unable to reach patient

## 2021-03-03 DIAGNOSIS — R102 Pelvic and perineal pain: Secondary | ICD-10-CM | POA: Diagnosis not present

## 2021-03-03 DIAGNOSIS — O021 Missed abortion: Secondary | ICD-10-CM | POA: Diagnosis not present

## 2021-04-30 DIAGNOSIS — J45909 Unspecified asthma, uncomplicated: Secondary | ICD-10-CM | POA: Diagnosis not present

## 2021-04-30 DIAGNOSIS — Z20822 Contact with and (suspected) exposure to covid-19: Secondary | ICD-10-CM | POA: Diagnosis not present

## 2021-04-30 DIAGNOSIS — J111 Influenza due to unidentified influenza virus with other respiratory manifestations: Secondary | ICD-10-CM | POA: Diagnosis not present

## 2021-05-02 ENCOUNTER — Telehealth: Payer: Self-pay

## 2021-05-02 NOTE — Telephone Encounter (Signed)
Transition Care Management Unsuccessful Follow-up Telephone Call  Date of discharge and from where:  04/30/2021 from [facility]  Attempts:  1st Attempt  Reason for unsuccessful TCM follow-up call:  Unable to leave message

## 2021-05-03 NOTE — Telephone Encounter (Signed)
Transition Care Management Unsuccessful Follow-up Telephone Call  Date of discharge and from where:  04/30/2021 from [facility]  Attempts:  2nd Attempt  Reason for unsuccessful TCM follow-up call:  Unable to leave message

## 2021-05-04 NOTE — Telephone Encounter (Signed)
Transition Care Management Unsuccessful Follow-up Telephone Call  Date of discharge and from where:  04/30/2021-[facility]   Attempts:  3rd Attempt  Reason for unsuccessful TCM follow-up call:  Unable to leave message

## 2021-06-03 ENCOUNTER — Telehealth: Payer: Medicaid Other | Admitting: Nurse Practitioner

## 2021-06-03 ENCOUNTER — Other Ambulatory Visit: Payer: Self-pay

## 2021-07-08 ENCOUNTER — Telehealth: Payer: Self-pay | Admitting: Family Medicine

## 2021-07-08 NOTE — Telephone Encounter (Signed)
.. °  Medicaid Managed Care   Unsuccessful Outreach Note  07/08/2021 Name: Stephanie Torres MRN: [id number] DOB: 05/06/1996  Referred by: Erven Colla, DO Reason for referral : High Risk Managed Medicaid (I called the patient today to get her scheduled with the MM Team. Her VM was not set up.)   An unsuccessful telephone outreach was attempted today. The patient was referred to the case management team for assistance with care management and care coordination.   Follow Up Plan: The care management team will reach out to the patient again over the next 14 days.    [place]

## 2021-07-27 ENCOUNTER — Encounter (HOSPITAL_COMMUNITY): Payer: Self-pay | Admitting: Emergency Medicine

## 2021-07-27 ENCOUNTER — Other Ambulatory Visit: Payer: Self-pay

## 2021-07-27 ENCOUNTER — Ambulatory Visit: Payer: Medicaid Other | Admitting: Family Medicine

## 2021-07-27 VITALS — BP 121/82 | HR 96 | Temp 98.9°F | Ht 64.0 in | Wt 256.0 lb

## 2021-07-27 DIAGNOSIS — Z0279 Encounter for issue of other medical certificate: Secondary | ICD-10-CM | POA: Diagnosis not present

## 2021-07-27 DIAGNOSIS — F419 Anxiety disorder, unspecified: Secondary | ICD-10-CM | POA: Diagnosis not present

## 2021-07-27 DIAGNOSIS — F410 Panic disorder [episodic paroxysmal anxiety] without agoraphobia: Secondary | ICD-10-CM | POA: Insufficient documentation

## 2021-07-27 DIAGNOSIS — R45851 Suicidal ideations: Secondary | ICD-10-CM

## 2021-07-27 DIAGNOSIS — F32A Depression, unspecified: Secondary | ICD-10-CM | POA: Diagnosis not present

## 2021-07-27 DIAGNOSIS — F418 Other specified anxiety disorders: Secondary | ICD-10-CM | POA: Diagnosis not present

## 2021-07-27 NOTE — Assessment & Plan Note (Signed)
Suicide ideation with no plan at this time.  I am quite concerned however given her past history.  She is going to the hospital today.

## 2021-07-27 NOTE — Assessment & Plan Note (Signed)
Established problem.  Worsening.  There are many other stressors going on at this time.  Given her possible adverse reaction to SSRI previously, worsening status, suicidal ideation, and history of suicide attempt I have advised her that she needs to go directly to the ER for further evaluation and management.  Needs to see psychiatry.  Will either need to be admitted or have intensive outpatient follow-up set up urgently.  Patient wants to go home and get things settled at home before going to the hospital.  I advised her that this is okay but that she needs to go today.  Patient is in agreement.

## 2021-07-27 NOTE — ED Triage Notes (Signed)
Pt states she wants to talk about her depression and anxiety with someone from [facility]. Pt denies any SI/Hi ideations.

## 2021-07-27 NOTE — Progress Notes (Signed)
Subjective:  Patient ID: Stephanie Torres, female    DOB: 1995-08-14  Age: 26 y.o. MRN: [id number]  CC: Chief Complaint  Patient presents with   caregiver stress   Depression    Anxiety    HPI:  26 year old female presents for evaluation of the above.  Patient states that she recently lost her child.  She had a missed abortion with fetal demise before 20 weeks (September).  She has been struggling since this occurred.  Patient states that she is also dealing with her parents who have been quite ill.  She states that her dad has been in and out of the hospital.  Mom has upcoming open heart surgery.  She states that she is under a great deal of stress.  She is taking care of her household as well as her parents.  She states that she feels very down and also feels very anxious.  She has had suicidal thoughts.  No plan.  Symptoms have been steadily worsening.  She states that she has been "breaking down" at work.  Especially worse over the past 3 days.  She has financial concerns regarding taking a leave of absence from work.  Patient has a history of anxiety and depression.  Had a previous suicide attempt in 2018 and was hospitalized.  Prior to this she was taking Zoloft.  It is unclear whether this incited the suicidal ideation or whether her underlying mental health worsened.  Zoloft is on her allergy list as a result.  PHQ-9 score equals 27.  GAD-7 score of 21.  Patient Active Problem List   Diagnosis Date Noted   Suicidal ideation 07/27/2021   Suicidal overdose, sequela ([place]) 04/27/2017   Anxiety and depression 04/27/2017   PCOS (polycystic ovarian syndrome) 04/11/2013   Hyperinsulinemia 11/21/2012   Iron deficiency anemia 11/21/2012   Other and unspecified hyperlipidemia 11/21/2012   Gastritis 10/31/2012   Migraine headache without aura 10/31/2012   Morbid obesity ([place]) 10/31/2012    Social Hx   Social History   Socioeconomic History   Marital status: Married    Spouse name:  Not on file   Number of children: 1   Years of education: Not on file   Highest education level: Not on file  Occupational History   Not on file  Tobacco Use   Smoking status: Never   Smokeless tobacco: Never  Vaping Use   Vaping Use: Never used  Substance and Sexual Activity   Alcohol use: No   Drug use: No   Sexual activity: Yes  Other Topics Concern   Not on file  Social History Narrative   Not on file   Social Determinants of Health   Financial Resource Strain: Not on file  Food Insecurity: Not on file  Transportation Needs: Not on file  Physical Activity: Not on file  Stress: Not on file  Social Connections: Not on file    Review of Systems Per HPI  Objective:  BP 121/82    Pulse 96    Temp 98.9 F (37.2 C)    Ht 5\' 4"  (1.626 m)    Wt 256 lb (116.1 kg)    SpO2 100%    BMI 43.94 kg/m   BP/Weight 07/27/2021 5/78/4696 07/14/5282  Systolic BP 132 440 102  Diastolic BP 82 86 95  Wt. (Lbs) 256 261 250  BMI 43.94 44.8 42.91    Physical Exam Constitutional:      General: She is not in acute distress.  Appearance: She is obese.  HENT:     Head: Normocephalic and atraumatic.  Cardiovascular:     Rate and Rhythm: Normal rate.  Pulmonary:     Effort: Pulmonary effort is normal.     Breath sounds: Normal breath sounds. No wheezing or rales.  Neurological:     Mental Status: She is alert.  Psychiatric:     Comments: Patient is very tearful and upset. Thought content is normal.  Endorses suicidal ideation.  No plan.    Lab Results  Component Value Date   WBC 10.9 (H) 10/10/2020   HGB 11.4 (L) 10/10/2020   HCT 36.7 10/10/2020   PLT 362 10/10/2020   GLUCOSE 117 (H) 10/10/2020   CHOL 234 (H) 11/12/2012   TRIG 170 (H) 11/12/2012   HDL 48 11/12/2012   LDLCALC 152 (H) 11/12/2012   ALT 17 10/10/2020   AST 18 10/10/2020   NA 137 10/10/2020   K 3.8 10/10/2020   CL 105 10/10/2020   CREATININE 0.61 10/10/2020   BUN 8 10/10/2020   CO2 26 10/10/2020   TSH  6.353 (H) 11/12/2012   HGBA1C 5.7 (H) 10/14/2020     Assessment & Plan:   Problem List Items Addressed This Visit       Other   Anxiety and depression - Primary    Established problem.  Worsening.  There are many other stressors going on at this time.  Given her possible adverse reaction to SSRI previously, worsening status, suicidal ideation, and history of suicide attempt I have advised her that she needs to go directly to the ER for further evaluation and management.  Needs to see psychiatry.  Will either need to be admitted or have intensive outpatient follow-up set up urgently.  Patient wants to go home and get things settled at home before going to the hospital.  I advised her that this is okay but that she needs to go today.  Patient is in agreement.      Suicidal ideation    Suicide ideation with no plan at this time.  I am quite concerned however given her past history.  She is going to the hospital today.      30 minutes spent during this encounter with the majority of the time spent talking directly with the patient about her current mental health issues/concerns and plan of care.  Significant time also spent reviewing the electronic medical record and her prior history.  [place]

## 2021-07-27 NOTE — Patient Instructions (Signed)
Go home at get things in order and go directly to the ER.  I am happy to fill out leave paperwork for you.  Best of luck  Dr. Lacinda Axon

## 2021-07-28 ENCOUNTER — Emergency Department (HOSPITAL_COMMUNITY)
Admission: EM | Admit: 2021-07-28 | Discharge: 2021-07-28 | Disposition: A | Discharge status: Home / Self Care | Payer: Medicaid Other | Attending: Emergency Medicine | Admitting: Emergency Medicine

## 2021-07-28 ENCOUNTER — Other Ambulatory Visit: Payer: Self-pay | Admitting: Family Medicine

## 2021-07-28 DIAGNOSIS — R45851 Suicidal ideations: Secondary | ICD-10-CM

## 2021-07-28 DIAGNOSIS — F32A Depression, unspecified: Secondary | ICD-10-CM

## 2021-07-28 DIAGNOSIS — F419 Anxiety disorder, unspecified: Secondary | ICD-10-CM

## 2021-07-28 MED ORDER — CLONAZEPAM 0.5 MG PO TABS: 0.5000 mg | ORAL_TABLET | Freq: Once | ORAL | Status: DC

## 2021-07-28 MED ORDER — CITALOPRAM HYDROBROMIDE 20 MG PO TABS: 20.0000 mg | ORAL_TABLET | Freq: Every day | ORAL | Status: DC

## 2021-07-28 MED ORDER — CLONAZEPAM 0.5 MG PO TABS: 0.5000 mg | ORAL_TABLET | Freq: Two times a day (BID) | ORAL | 0 refills | Status: DC | PRN

## 2021-07-28 MED ORDER — CITALOPRAM HYDROBROMIDE 20 MG PO TABS: 20.0000 mg | ORAL_TABLET | Freq: Every day | ORAL | 0 refills | Status: DC

## 2021-07-28 NOTE — ED Provider Notes (Signed)
[facility] EMERGENCY DEPARTMENT Provider Note   CSN: [id number] Arrival date & time: 07/27/21  2219     History  Chief Complaint  Patient presents with   Medical Clearance    ROCIO Torres is a 26 y.o. female.  Saw a PCP today and he told her to come to the ED for evaluation or he would have someone come and get her. States she had a panic attack yesterday but is not SI/HI/hallucinating or having severe paranoia or other psychotic features.    Depression This is a chronic problem. The current episode started more than 2 days ago. The problem has not changed since onset.Pertinent negatives include no chest pain, no abdominal pain, no headaches and no shortness of breath. Nothing aggravates the symptoms. Nothing relieves the symptoms. She has tried nothing for the symptoms.      Home Medications Prior to Admission medications   Medication Sig Start Date End Date Taking? Authorizing Provider  citalopram (CELEXA) 20 MG tablet Take 1 tablet (20 mg total) by mouth daily. 07/28/21 08/27/21 Yes Mertice Uffelman, Corene Cornea, MD  clonazePAM (KLONOPIN) 0.5 MG tablet Take 1 tablet (0.5 mg total) by mouth 2 (two) times daily as needed for anxiety. 07/28/21  Yes Doyle Tegethoff, Corene Cornea, MD  escitalopram (LEXAPRO) 10 MG tablet Take 1 tablet (10 mg total) by mouth daily. Patient not taking: Reported on 07/27/2021 01/21/21 01/21/22  Nilda Simmer, NP      Allergies    Zoloft [sertraline hcl]    Review of Systems   Review of Systems  Respiratory:  Negative for shortness of breath.   Cardiovascular:  Negative for chest pain.  Gastrointestinal:  Negative for abdominal pain.  Neurological:  Negative for headaches.  Psychiatric/Behavioral:  Positive for depression.   All other systems reviewed and are negative.  Physical Exam Updated Vital Signs BP 118/79 (BP Location: Right Arm)    Pulse (!) 101    Temp 98.2 F (36.8 C) (Oral)    Resp 20    Ht 5\' 4"  (1.626 m)    Wt 116.1 kg    LMP 07/04/2021    SpO2 97%    BMI  43.94 kg/m  Physical Exam Vitals and nursing note reviewed.  Constitutional:      Appearance: She is well-developed.  HENT:     Head: Normocephalic and atraumatic.     Nose: Nose normal. No congestion or rhinorrhea.     Mouth/Throat:     Mouth: Mucous membranes are moist.     Pharynx: Oropharynx is clear.  Eyes:     Pupils: Pupils are equal, round, and reactive to light.  Cardiovascular:     Rate and Rhythm: Normal rate and regular rhythm.  Pulmonary:     Effort: No respiratory distress.     Breath sounds: No stridor.  Abdominal:     General: Abdomen is flat. There is no distension.  Musculoskeletal:        General: No swelling or tenderness. Normal range of motion.     Cervical back: Normal range of motion.  Neurological:     General: No focal deficit present.     Mental Status: She is alert.  Psychiatric:        Thought Content: Thought content is not paranoid. Thought content does not include homicidal or suicidal ideation.    ED Results / Procedures / Treatments   Labs (all labs ordered are listed, but only abnormal results are displayed) Labs Reviewed - No data to display  EKG  None  Radiology No results found.  Procedures Procedures    Medications Ordered in ED Medications  citalopram (CELEXA) tablet 20 mg (has no administration in time range)  clonazePAM (KLONOPIN) tablet 0.5 mg (has no administration in time range)    ED Course/ Medical Decision Making/ A&P                           Medical Decision Making Risk Prescription drug management.   No indication for TTS dconsultation. Will restart celexa/klonopin. Number for [facility] and other resources provided.    Final Clinical Impression(s) / ED Diagnoses Final diagnoses:  Depression, unspecified depression type  Anxiety    Rx / DC Orders ED Discharge Orders          Ordered    citalopram (CELEXA) 20 MG tablet  Daily        07/28/21 0351    clonazePAM (KLONOPIN) 0.5 MG tablet  2 times daily  PRN        07/28/21 0351              Moorea Boissonneault, Corene Cornea, MD 07/28/21 0410

## 2021-07-29 ENCOUNTER — Other Ambulatory Visit: Payer: Self-pay

## 2021-07-29 ENCOUNTER — Telehealth: Payer: Medicaid Other | Admitting: Nurse Practitioner

## 2021-07-29 ENCOUNTER — Encounter: Payer: Self-pay | Admitting: Nurse Practitioner

## 2021-07-29 ENCOUNTER — Ambulatory Visit: Payer: Medicaid Other | Admitting: Nurse Practitioner

## 2021-07-29 ENCOUNTER — Telehealth: Payer: Self-pay

## 2021-07-29 VITALS — BP 135/82 | HR 78 | Temp 97.4°F | Ht 64.0 in | Wt 254.2 lb

## 2021-07-29 DIAGNOSIS — F32A Depression, unspecified: Secondary | ICD-10-CM

## 2021-07-29 DIAGNOSIS — F419 Anxiety disorder, unspecified: Secondary | ICD-10-CM

## 2021-07-29 NOTE — Telephone Encounter (Signed)
Transition Care Management Follow-up Telephone Call Date of discharge and from where: 07/28/2021 from [facility] How have you been since you were released from the hospital? Patient stated that she was feeling better today and did not have any questions or concerns.  Any questions or concerns? No  Items Reviewed: Did the pt receive and understand the discharge instructions provided? Yes  Medications obtained and verified? Yes  Other? No  Any new allergies since your discharge? No  Dietary orders reviewed? No Do you have support at home? Yes   Functional Questionnaire: (I = Independent and D = Dependent) ADLs: I  Bathing/Dressing- I  Meal Prep- I  Eating- I  Maintaining continence- I  Transferring/Ambulation- I  Managing Meds- I   Follow up appointments reviewed:  PCP Hospital f/u appt confirmed? Yes  Scheduled to see Pearson Forster, NP on 07/29/2021. [facility] f/u appt confirmed? No   Are transportation arrangements needed? No  If their condition worsens, is the pt aware to call PCP or go to the Emergency Dept.? Yes Was the patient provided with contact information for the PCP's office or ED? Yes Was to pt encouraged to call back with questions or concerns? Yes

## 2021-07-29 NOTE — Progress Notes (Signed)
° °  Subjective:    Patient ID: Stephanie Torres, female    DOB: February 17, 1996, 26 y.o.   MRN: [id number]  HPI  Patient here for [facility] ER follow up from yesterday. They determined that patient did not need inpatient care. Prescribed Celexa and Klonopin which she has filled and not started at this point. Has a miscarriage several months ago. Admits she has not dealt with this. Has no plans for pregnancy at this time. Her father has multiple health issues and her mother is preparing for valve replacement. Trying to help both of them. States her husband is very supportive. Angry and upset easily. Works at [employer] which causes increased stress. At this time, denies any suicidal or homicidal thoughts or ideation. States she is "just tired of feeling this way". See previous note 07/27/21. Received a phone call today from behavioral health for counseling 3/16.   Review of Systems  Constitutional:  Positive for fatigue.  Respiratory:  Negative for cough, chest tightness and shortness of breath.   Cardiovascular:  Negative for chest pain.  Psychiatric/Behavioral:  Positive for agitation. Negative for self-injury and suicidal ideas. The patient is nervous/anxious.        Objective:   Physical Exam NAD. Alert, oriented. Lungs clear. Heart RRR. Mildly anxious affect. Tearful at times. Making good eye contact. Dressed appropriately. Speech clear. Thoughts logical, coherent and relevant.  Today's Vitals   07/29/21 0938  BP: 135/82  Pulse: 78  Temp: (!) 97.4 F (36.3 C)  TempSrc: Oral  SpO2: 97%  Weight: 254 lb 3.2 oz (115.3 kg)  Height: 5\' 4"  (1.626 m)   Body mass index is 43.63 kg/m.      Assessment & Plan:   Problem List Items Addressed This Visit       Other   Anxiety and depression - Primary   Start Celexa as planned. Patient was on this long term after her last mental health hospitalization with no side effects. DC med and call if any problems. Use Klonopin sparingly, only for  extreme anxiety. Verbal agreement from patient to seek help immediately if any suicidal thoughts or ideation. Work note for 07/25/21-08/18/21 when she starts counseling.  If we manage her medications, recheck in one month here. Otherwise, follow up with behavioral health.

## 2021-08-05 ENCOUNTER — Encounter: Payer: Self-pay | Admitting: Family Medicine

## 2021-08-10 ENCOUNTER — Encounter: Payer: Self-pay | Admitting: Nurse Practitioner

## 2021-08-18 ENCOUNTER — Other Ambulatory Visit: Payer: Self-pay

## 2021-08-18 ENCOUNTER — Encounter (HOSPITAL_COMMUNITY): Payer: Self-pay

## 2021-08-18 ENCOUNTER — Ambulatory Visit (INDEPENDENT_AMBULATORY_CARE_PROVIDER_SITE_OTHER): Payer: Medicaid Other | Admitting: Clinical

## 2021-08-18 DIAGNOSIS — F32A Depression, unspecified: Secondary | ICD-10-CM | POA: Diagnosis not present

## 2021-08-18 DIAGNOSIS — F419 Anxiety disorder, unspecified: Secondary | ICD-10-CM | POA: Diagnosis not present

## 2021-08-18 DIAGNOSIS — F331 Major depressive disorder, recurrent, moderate: Secondary | ICD-10-CM
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
## 2021-08-18 NOTE — Plan of Care (Signed)
Verbal Consent 

## 2021-08-26 ENCOUNTER — Other Ambulatory Visit: Payer: Self-pay

## 2021-08-26 ENCOUNTER — Ambulatory Visit (INDEPENDENT_AMBULATORY_CARE_PROVIDER_SITE_OTHER): Payer: Medicaid Other | Admitting: Nurse Practitioner

## 2021-08-26 ENCOUNTER — Telehealth: Payer: Self-pay

## 2021-08-26 DIAGNOSIS — F32A Depression, unspecified: Secondary | ICD-10-CM | POA: Diagnosis not present

## 2021-08-26 DIAGNOSIS — F419 Anxiety disorder, unspecified: Secondary | ICD-10-CM

## 2021-08-26 MED ORDER — HYDROXYZINE HCL 10 MG PO TABS: ORAL_TABLET | ORAL | 0 refills | Status: DC

## 2021-08-26 MED ORDER — BUSPIRONE HCL 7.5 MG PO TABS: 7.5000 mg | ORAL_TABLET | Freq: Two times a day (BID) | ORAL | 0 refills | Status: DC

## 2021-08-26 NOTE — Progress Notes (Deleted)
? ?  Subjective:  ?I connected with  Stephanie Torres on 08/26/21 by a video enabled telemedicine application and verified that I am speaking with the correct person using two identifiers. ?  ?I discussed the limitations of evaluation and management by telemedicine. The patient expressed understanding and agreed to proceed. ? ?Patient location: home ? ?Provider location: in office ? ?I provided *** minutes of non face - to - face time during this encounter.  ? ? Patient ID: Stephanie Torres, female    DOB: [birth year], 26 y.o.   MRN: [id number] ? ?HPI ?Patient follow up for anxiety - gad -14 and depression - phq9- 14 ?Works night , reports clonazepam has made her very drowzy causing not hearing phone ringing during the day in case of emergency  ?Taking citalopram ? ?Review of Systems ? ?   ?Objective:  ? Physical Exam ? ? ? ? ?   ?Assessment & Plan:  ? ? ?

## 2021-08-26 NOTE — Telephone Encounter (Signed)
I connected with  Stephanie Torres on 08/26/21 by a video enabled telemedicine application and verified that I am speaking with the correct person using two identifiers. ?  ?I discussed the limitations of evaluation and management by telemedicine. The patient expressed understanding and agreed to proceed.  ?

## 2021-08-27 ENCOUNTER — Encounter: Payer: Self-pay | Admitting: Nurse Practitioner

## 2021-08-27 NOTE — Progress Notes (Signed)
Patient ID: Stephanie Torres, female   DOB: [birth year], 26 y.o.   MRN: [id number] ?Virtual Visit via Telephone Note ? ?I connected with Stephanie Torres on 08/27/21 at 11:00 AM EDT by telephone and verified that I am speaking with the correct person using two identifiers. ? ?Location: ?Patient: home ?Provider: office ?  ?I discussed the limitations, risks, security and privacy concerns of performing an evaluation and management service by telephone and the availability of in person appointments. I also discussed with the patient that there may be a patient responsible charge related to this service. The patient expressed understanding and agreed to proceed. ? ? ?History of Present Illness: ?Presents for her for recheck see previous note.  Has not discussed her counseling.  Taking Klonopin on a rare basis because even half a tablet will make her very sleepy.  Has seen some improvement on Celexa but continues to have significant anxiety attacks at times mainly at work.  Describes her sleep as "crazy" with some nightmares and weird dreams at times.  Denies any suicidal or homicidal thoughts or behaviors.  Denies any self-harm behaviors. ? ?  08/26/2021  ?  9:42 AM  ?Depression screen PHQ 2/9  ?Decreased Interest 2  ?Down, Depressed, Hopeless 1  ?PHQ - 2 Score 3  ?Altered sleeping 3  ?Tired, decreased energy 3  ?Change in appetite 3  ?Feeling bad or failure about yourself  0  ?Trouble concentrating 2  ?Moving slowly or fidgety/restless 0  ?Suicidal thoughts 0  ?PHQ-9 Score 14  ?Difficult doing work/chores Very difficult  ? ? ?  08/26/2021  ?  9:44 AM 08/18/2021  ?  2:30 PM 02/04/2021  ? 11:39 AM 01/21/2021  ?  9:20 AM  ?GAD 7 : Generalized Anxiety Score  ?Nervous, Anxious, on Edge '2  3 3  '$ ?Control/stop worrying '2  3 3  '$ ?Worry too much - different things '2  3 3  '$ ?Trouble relaxing '2  3 3  '$ ?Restless '1  2 3  '$ ?Easily annoyed or irritable '3  3 3  '$ ?Afraid - awful might happen 2  0 3  ?Total GAD 7 Score '14  17 21  '$ ?Anxiety Difficulty  Very difficult  Very difficult Very difficult  ?  ? Information is confidential and restricted. Go to Review Flowsheets to unlock data.  ? ? ? ?  ?Observations/Objective: ?Today's visit was via telephone ?Physical exam was not possible for this visit ?Alert, oriented.  Speech clear.  Thoughts logical coherent and relevant.  Judgment and behavior normal per phone call. ? ?Assessment and Plan: ?Problem List Items Addressed This Visit   ? ?  ? Other  ? Anxiety and depression - Primary  ? Relevant Medications  ? hydrOXYzine (ATARAX) 10 MG tablet  ? busPIRone (BUSPAR) 7.5 MG tablet  ? ?Meds ordered this encounter  ?Medications  ? hydrOXYzine (ATARAX) 10 MG tablet  ?  Sig: Take 1/2 - 1 tab po BID prn anxiety. Drowsiness precautions.  ?  Dispense:  30 tablet  ?  Refill:  0  ?  Order Specific Question:   Supervising Provider  ?  Answer:   Sallee Lange A [[id number]]  ? busPIRone (BUSPAR) 7.5 MG tablet  ?  Sig: Take 1 tablet (7.5 mg total) by mouth 2 (two) times daily.  ?  Dispense:  60 tablet  ?  Refill:  0  ?  Order Specific Question:   Supervising Provider  ?  Answer:   Sallee Lange A [[id number]]  ? ? ? ?  Follow Up Instructions: ?Discussed options.  Trial of BuSpar 7.5 mg twice daily to current regimen.  Cautioned about symptoms of serotonin syndrome.  Discontinue medication and contact office if any problems. ?Hold on Klonopin use sparingly only for sleep.  Start hydroxyzine 10 mg half to 1 tab p.o. twice daily as needed anxiety attacks.  Drowsiness precautions. ?Return in about 1 month (around 09/26/2021). ?Call back sooner if any problems.  Recommend that she continue therapy. ?  ?I discussed the assessment and treatment plan with the patient. The patient was provided an opportunity to ask questions and all were answered. The patient agreed with the plan and demonstrated an understanding of the instructions. ?  ?The patient was advised to call back or seek an in-person evaluation if the symptoms worsen or if the condition fails  to improve as anticipated. ? ?I provided 20 minutes of non-face-to-face time during this encounter. ? ? ?Nilda Simmer, NP ? ?

## 2021-09-13 ENCOUNTER — Ambulatory Visit (HOSPITAL_COMMUNITY): Payer: Medicaid Other | Admitting: Clinical

## 2021-09-13 ENCOUNTER — Telehealth (HOSPITAL_COMMUNITY): Payer: Self-pay | Admitting: Clinical

## 2021-09-13 NOTE — Telephone Encounter (Signed)
The patient was not avalible and did not respond to contact attempts. The patients VM box is not set up ?

## 2021-09-14 ENCOUNTER — Other Ambulatory Visit: Payer: Self-pay | Admitting: Nurse Practitioner

## 2021-09-16 ENCOUNTER — Telehealth: Payer: Self-pay | Admitting: *Deleted

## 2021-09-16 ENCOUNTER — Encounter: Payer: Self-pay | Admitting: Family Medicine

## 2021-09-16 NOTE — Telephone Encounter (Signed)
Patient says she really have a hard time at work.  She says there are certain people at work that are making her not want to go to work. Patient says she needs a work note to be out until Monday 09/19/21 so she can go in and talk to the manager about what's going on. ? ?Spoke with Dr. Lacinda Axon and advised him of patient's problems.  He states pt may have a work note until Monday. ? ?Patient advised can have work note until Monday. ?

## 2021-09-20 ENCOUNTER — Other Ambulatory Visit: Payer: Self-pay

## 2021-09-20 ENCOUNTER — Emergency Department (HOSPITAL_COMMUNITY)
Admission: EM | Admit: 2021-09-20 | Discharge: 2021-09-20 | Disposition: A | Discharge status: Home / Self Care | Payer: Medicaid Other | Attending: Emergency Medicine | Admitting: Emergency Medicine

## 2021-09-20 ENCOUNTER — Encounter (HOSPITAL_COMMUNITY): Payer: Self-pay | Admitting: *Deleted

## 2021-09-20 DIAGNOSIS — L309 Dermatitis, unspecified: Secondary | ICD-10-CM | POA: Insufficient documentation

## 2021-09-20 DIAGNOSIS — R21 Rash and other nonspecific skin eruption: Secondary | ICD-10-CM | POA: Diagnosis not present

## 2021-09-20 MED ORDER — METHYLPREDNISOLONE SODIUM SUCC 125 MG IJ SOLR
125.0000 mg | Freq: Once | INTRAMUSCULAR | Status: AC
  Administered 2021-09-20: 125 mg via INTRAMUSCULAR
  Filled 2021-09-20: qty 2

## 2021-09-20 MED ORDER — DOXYCYCLINE HYCLATE 100 MG PO CAPS: 100.0000 mg | ORAL_CAPSULE | Freq: Two times a day (BID) | ORAL | 0 refills | Status: DC

## 2021-09-20 MED ORDER — PREDNISONE 20 MG PO TABS: ORAL_TABLET | ORAL | 0 refills | Status: DC

## 2021-09-20 NOTE — Discharge Instructions (Signed)
Follow-up with your doctor next week for recheck.  Take Benadryl for itching ?

## 2021-09-20 NOTE — ED Triage Notes (Signed)
Pt c/o rash to breasts, abdomen and bilateral upper thighs x one week; pt states it has gotten worse and thinks it may be poison oak ?

## 2021-09-20 NOTE — ED Provider Notes (Signed)
?[facility] ?Provider Note ? ? ?CSN: [id number] ?Arrival date & time: 09/20/21  1845 ? ?  ? ?History ? ?Chief Complaint  ?Patient presents with  ? Rash  ? ? ?Stephanie Torres is a 26 y.o. female. ? ?Patient complains of a rash that is pruritic.  She thinks he got poison ivy.  Patient has no significant other medical problem ? ?The history is provided by the patient. No language interpreter was used.  ?Rash ?Location:  Full body ?Quality: blistering   ?Severity:  Moderate ?Onset quality:  Sudden ?Timing:  Constant ?Progression:  Worsening ?Chronicity:  New ?Context: not animal contact   ?Relieved by:  Nothing ?Worsened by:  Nothing ?Associated symptoms: no abdominal pain, no diarrhea, no fatigue and no headaches   ? ?  ? ?Home Medications ?Prior to Admission medications   ?Medication Sig Start Date End Date Taking? Authorizing Provider  ?doxycycline (VIBRAMYCIN) 100 MG capsule Take 1 capsule (100 mg total) by mouth 2 (two) times daily. One po bid x 7 days 09/20/21  Yes Milton Ferguson, MD  ?predniSONE (DELTASONE) 20 MG tablet 2 tabs po daily x 3 days 09/20/21  Yes Milton Ferguson, MD  ?busPIRone (BUSPAR) 7.5 MG tablet Take 1 tablet (7.5 mg total) by mouth 2 (two) times daily. 08/26/21   Nilda Simmer, NP  ?citalopram (CELEXA) 20 MG tablet Take 1 tablet (20 mg total) by mouth daily. 07/28/21 08/27/21  Mesner, Corene Cornea, MD  ?clonazePAM (KLONOPIN) 0.5 MG tablet Take 1 tablet (0.5 mg total) by mouth 2 (two) times daily as needed for anxiety. 07/28/21   Mesner, Corene Cornea, MD  ?hydrOXYzine (ATARAX) 10 MG tablet Take 1/2 - 1 tab po BID prn anxiety. Drowsiness precautions. 08/26/21   Nilda Simmer, NP  ?   ? ?Allergies    ?Zoloft [sertraline hcl]   ? ?Review of Systems   ?Review of Systems  ?Constitutional:  Negative for appetite change and fatigue.  ?HENT:  Negative for congestion, ear discharge and sinus pressure.   ?Eyes:  Negative for discharge.  ?Respiratory:  Negative for cough.   ?Cardiovascular:   Negative for chest pain.  ?Gastrointestinal:  Negative for abdominal pain and diarrhea.  ?Genitourinary:  Negative for frequency and hematuria.  ?Musculoskeletal:  Negative for back pain.  ?Skin:  Positive for rash.  ?Neurological:  Negative for seizures and headaches.  ?Psychiatric/Behavioral:  Negative for hallucinations.   ? ?Physical Exam ?Updated Vital Signs ?BP (!) 158/95 (BP Location: Right Arm)   Pulse 90   Temp 97.6 ?F (36.4 ?C) (Oral)   Resp 18   Ht '5\' 4"'$  (1.626 m)   Wt 116.6 kg   SpO2 99%   BMI 44.11 kg/m?  ?Physical Exam ?Vitals and nursing note reviewed.  ?Constitutional:   ?   Appearance: She is well-developed.  ?HENT:  ?   Head: Normocephalic.  ?   Nose: Nose normal.  ?Eyes:  ?   General: No scleral icterus. ?   Conjunctiva/sclera: Conjunctivae normal.  ?Neck:  ?   Thyroid: No thyromegaly.  ?Cardiovascular:  ?   Rate and Rhythm: Normal rate and regular rhythm.  ?   Heart sounds: No murmur heard. ?  No friction rub. No gallop.  ?Pulmonary:  ?   Breath sounds: No stridor. No wheezing or rales.  ?Chest:  ?   Chest wall: No tenderness.  ?Abdominal:  ?   General: There is no distension.  ?   Tenderness: There is no abdominal tenderness. There is no  rebound.  ?Musculoskeletal:     ?   General: Normal range of motion.  ?   Cervical back: Neck supple.  ?Lymphadenopathy:  ?   Cervical: No cervical adenopathy.  ?Skin: ?   Findings: Erythema and rash present.  ?   Comments: Rash throughout abdomen and extremities consistent with contact dermatitis with possible secondary bacterial infection  ?Neurological:  ?   Mental Status: She is alert and oriented to person, place, and time.  ?   Motor: No abnormal muscle tone.  ?   Coordination: Coordination normal.  ?Psychiatric:     ?   Behavior: Behavior normal.  ? ? ?ED Results / Procedures / Treatments   ?Labs ?(all labs ordered are listed, but only abnormal results are displayed) ?Labs Reviewed - No data to display ? ?EKG ?None ? ?Radiology ?No results  found. ? ?Procedures ?Procedures  ? ? ?Medications Ordered in ED ?Medications  ?methylPREDNISolone sodium succinate (SOLU-MEDROL) 125 mg/2 mL injection 125 mg (has no administration in time range)  ? ?This patient presents to the ED for concern of rash, this involves an extensive number of treatment options, and is a complaint that carries with it a high risk of complications and morbidity.  The differential diagnosis includes contact dermatitis, connective tissue disease ? ? ?Co morbidities that complicate the patient evaluation ? ?None ? ? ?Additional history obtained: ? ?Additional history obtained from friend ?External records from outside source obtained and reviewed including hospital record ? ? ?Lab Tests: ?No lab ? ?Imaging Studies ordered: ? ?No x-rays ? ?Cardiac Monitoring: / EKG: ? ?The patient was maintained on a cardiac monitor.  I personally viewed and interpreted the cardiac monitored which showed an underlying rhythm of: Normal sinus rhythm ? ? ?Consultations Obtained: ? ?No consult ? ?Problem List / ED Course / Critical interventions / Medication management ? ?Dermatitis ?I ordered medication including steroid injection with Solu-Medrol ?Reevaluation of the patient after these medicines showed that the patient stayed the same ?I have reviewed the patients home medicines and have made adjustments as needed ? ? ?Social Determinants of Health: ? ?None ? ? ?Test / Admission - Considered: ? ?None ? ?ED Course/ Medical Decision Making/ A&P ? ? ?Patient was given a shot of steroids for her rash.  It also looks like there might be secondary bacterial infection so she is placed on prednisone for the poison ivy and doxycycline for possible bacterial infection ?                        ?Medical Decision Making ?Risk ?Prescription drug management. ? ? ?Dermatitis possible allergic with secondary bacterial infection ? ? ? ? ? ? ? ?Final Clinical Impression(s) / ED Diagnoses ?Final diagnoses:  ?Rash  ? ? ?Rx / DC  Orders ?ED Discharge Orders   ? ?      Ordered  ?  predniSONE (DELTASONE) 20 MG tablet       ? 09/20/21 1958  ?  doxycycline (VIBRAMYCIN) 100 MG capsule  2 times daily       ? 09/20/21 1958  ? ?  ?  ? ?  ? ? ?  ?Milton Ferguson, MD ?09/21/21 1108 ? ?

## 2021-09-21 ENCOUNTER — Telehealth: Payer: Self-pay

## 2021-09-21 NOTE — Telephone Encounter (Signed)
Transition Care Management Unsuccessful Follow-up Telephone Call ? ?Date of discharge and from where:  09/20/2021-[facility]  ? ?Attempts:  1st Attempt ? ?Reason for unsuccessful TCM follow-up call:  Unable to leave message ? ?  ?

## 2021-09-22 NOTE — Telephone Encounter (Signed)
Transition Care Management Unsuccessful Follow-up Telephone Call ? ?Date of discharge and from where:  09/20/2021-[facility]  ? ?Attempts:  2nd Attempt ? ?Reason for unsuccessful TCM follow-up call:  Unable to leave message ? ? ? ?

## 2021-09-26 NOTE — Telephone Encounter (Signed)
Transition Care Management Unsuccessful Follow-up Telephone Call ? ?Date of discharge and from where:  09/20/2021-[place]  ? ?Attempts:  3rd Attempt ? ?Reason for unsuccessful TCM follow-up call:  Unable to leave message ? ?  ?

## 2021-11-28 DIAGNOSIS — F419 Anxiety disorder, unspecified: Secondary | ICD-10-CM | POA: Diagnosis not present

## 2021-11-28 DIAGNOSIS — F32A Depression, unspecified: Secondary | ICD-10-CM | POA: Diagnosis not present

## 2021-12-30 ENCOUNTER — Ambulatory Visit (INDEPENDENT_AMBULATORY_CARE_PROVIDER_SITE_OTHER): Payer: Medicaid Other | Admitting: Family Medicine

## 2021-12-30 ENCOUNTER — Encounter: Payer: Self-pay | Admitting: Family Medicine

## 2021-12-30 VITALS — BP 127/83 | HR 89 | Temp 98.3°F | Wt 258.4 lb

## 2021-12-30 DIAGNOSIS — J020 Streptococcal pharyngitis: Secondary | ICD-10-CM

## 2021-12-30 DIAGNOSIS — J029 Acute pharyngitis, unspecified: Secondary | ICD-10-CM

## 2021-12-30 DIAGNOSIS — R21 Rash and other nonspecific skin eruption: Secondary | ICD-10-CM | POA: Diagnosis not present

## 2021-12-30 LAB — POCT RAPID STREP A (OFFICE): Rapid Strep A Screen: POSITIVE — AB

## 2021-12-30 MED ORDER — HYDROXYZINE PAMOATE 25 MG PO CAPS: 25.0000 mg | ORAL_CAPSULE | Freq: Three times a day (TID) | ORAL | 0 refills | Status: DC | PRN

## 2021-12-30 MED ORDER — PREDNISONE 10 MG (21) PO TBPK: ORAL_TABLET | ORAL | 0 refills | Status: DC

## 2021-12-30 MED ORDER — DOXYCYCLINE HYCLATE 100 MG PO TABS: 100.0000 mg | ORAL_TABLET | Freq: Two times a day (BID) | ORAL | 0 refills | Status: DC

## 2021-12-30 MED ORDER — AMOXICILLIN 500 MG PO CAPS: 500.0000 mg | ORAL_CAPSULE | Freq: Two times a day (BID) | ORAL | 0 refills | Status: AC

## 2021-12-30 NOTE — Patient Instructions (Signed)
Medications as prescribed.  Call with concerns.  Take care  Dr. Roselie Cirigliano  

## 2022-01-01 DIAGNOSIS — J020 Streptococcal pharyngitis: Secondary | ICD-10-CM | POA: Insufficient documentation

## 2022-01-01 DIAGNOSIS — R21 Rash and other nonspecific skin eruption: Secondary | ICD-10-CM | POA: Insufficient documentation

## 2022-01-01 NOTE — Progress Notes (Signed)
Subjective:  Patient ID: Stephanie Torres, female    DOB: Apr 21, 1996  Age: 26 y.o. MRN: [id number]  CC: Chief Complaint  Patient presents with   Sore Throat   Rash    All over. Itchy    Nasal Congestion    HPI:  26 year old female presents for evaluation of the above.  Over the past few days patient has developed sore throat and nasal congestion.  Patient is also developed diffuse rash.  These appear clinically like bites.  She has gotten a couple of ticks off of her.  No fever.  No relieving factors.  No other complaints or concerns at this time.  Patient Active Problem List   Diagnosis Date Noted   Strep pharyngitis 01/01/2022   Rash 01/01/2022   Suicidal ideation 07/27/2021   Suicidal overdose, sequela ([place]) 04/27/2017   Anxiety and depression 04/27/2017   PCOS (polycystic ovarian syndrome) 04/11/2013   Hyperinsulinemia 11/21/2012   Iron deficiency anemia 11/21/2012   Other and unspecified hyperlipidemia 11/21/2012   Migraine headache without aura 10/31/2012   Morbid obesity ([place]) 10/31/2012    Social Hx   Social History   Socioeconomic History   Marital status: Married    Spouse name: Not on file   Number of children: 1   Years of education: Not on file   Highest education level: Not on file  Occupational History   Not on file  Tobacco Use   Smoking status: Never   Smokeless tobacco: Never  Vaping Use   Vaping Use: Never used  Substance and Sexual Activity   Alcohol use: No   Drug use: No   Sexual activity: Yes  Other Topics Concern   Not on file  Social History Narrative   Not on file   Social Determinants of Health   Financial Resource Strain: Not on file  Food Insecurity: Not on file  Transportation Needs: Not on file  Physical Activity: Not on file  Stress: Not on file  Social Connections: Not on file    Review of Systems Per HPI  Objective:  BP 127/83   Pulse 89   Temp 98.3 F (36.8 C) (Oral)   Wt 258 lb 6.4 oz (117.2 kg)   SpO2  97%   BMI 44.35 kg/m      12/30/2021   11:12 AM 09/20/2021    6:52 PM 09/20/2021    6:51 PM  BP/Weight  Systolic BP 542  706  Diastolic BP 83  95  Wt. (Lbs) 258.4 257   BMI 44.35 kg/m2 44.11 kg/m2     Physical Exam Vitals and nursing note reviewed.  Constitutional:      Appearance: Normal appearance. She is obese.  HENT:     Head: Normocephalic and atraumatic.     Right Ear: Tympanic membrane normal.     Left Ear: Tympanic membrane normal.     Mouth/Throat:     Pharynx: Posterior oropharyngeal erythema present. No oropharyngeal exudate.  Cardiovascular:     Rate and Rhythm: Normal rate and regular rhythm.  Pulmonary:     Effort: Pulmonary effort is normal.     Breath sounds: Normal breath sounds. No wheezing, rhonchi or rales.  Skin:    Comments: Multiple scattered erythematous papules consistent with bug bites.  Neurological:     Mental Status: She is alert.     Lab Results  Component Value Date   WBC 10.9 (H) 10/10/2020   HGB 11.4 (L) 10/10/2020   HCT 36.7 10/10/2020  PLT 362 10/10/2020   GLUCOSE 117 (H) 10/10/2020   CHOL 234 (H) 11/12/2012   TRIG 170 (H) 11/12/2012   HDL 48 11/12/2012   LDLCALC 152 (H) 11/12/2012   ALT 17 10/10/2020   AST 18 10/10/2020   NA 137 10/10/2020   K 3.8 10/10/2020   CL 105 10/10/2020   CREATININE 0.61 10/10/2020   BUN 8 10/10/2020   CO2 26 10/10/2020   TSH 6.353 (H) 11/12/2012   HGBA1C 5.7 (H) 10/14/2020     Assessment & Plan:   Problem List Items Addressed This Visit       Respiratory   Strep pharyngitis - Primary    Rapid strep was positive.  Treating with amoxicillin.      Relevant Orders   POCT rapid strep A (Completed)     Musculoskeletal and Integument   Rash    Multiple bug bites.  Treating with prednisone and hydroxyzine.       Meds ordered this encounter  Medications   DISCONTD: doxycycline (VIBRA-TABS) 100 MG tablet    Sig: Take 1 tablet (100 mg total) by mouth 2 (two) times daily.    Dispense:   14 tablet    Refill:  0   predniSONE (STERAPRED UNI-PAK 21 TAB) 10 MG (21) TBPK tablet    Sig: 6 tablets on day 1; decrease by 1 tablet daily until gone.    Dispense:  21 tablet    Refill:  0   hydrOXYzine (VISTARIL) 25 MG capsule    Sig: Take 1 capsule (25 mg total) by mouth every 8 (eight) hours as needed for itching.    Dispense:  30 capsule    Refill:  0   amoxicillin (AMOXIL) 500 MG capsule    Sig: Take 1 capsule (500 mg total) by mouth 2 (two) times daily for 10 days.    Dispense:  20 capsule    Refill:  [facility]

## 2022-01-01 NOTE — Assessment & Plan Note (Signed)
Rapid strep was positive.  Treating with amoxicillin.

## 2022-01-01 NOTE — Assessment & Plan Note (Signed)
Multiple bug bites.  Treating with prednisone and hydroxyzine.

## 2022-02-27 ENCOUNTER — Encounter: Payer: Self-pay | Admitting: Nurse Practitioner

## 2022-02-27 ENCOUNTER — Ambulatory Visit: Payer: Medicaid Other | Admitting: Nurse Practitioner

## 2022-02-27 VITALS — BP 128/84 | HR 110 | Temp 98.6°F | Ht 64.0 in | Wt 259.0 lb

## 2022-02-27 DIAGNOSIS — R051 Acute cough: Secondary | ICD-10-CM

## 2022-02-27 DIAGNOSIS — R0602 Shortness of breath: Secondary | ICD-10-CM | POA: Diagnosis not present

## 2022-02-27 DIAGNOSIS — J4521 Mild intermittent asthma with (acute) exacerbation: Secondary | ICD-10-CM | POA: Diagnosis not present

## 2022-02-27 MED ORDER — ALBUTEROL SULFATE HFA 108 (90 BASE) MCG/ACT IN AERS: 2.0000 | INHALATION_SPRAY | Freq: Four times a day (QID) | RESPIRATORY_TRACT | 2 refills | Status: DC | PRN

## 2022-02-27 MED ORDER — PREDNISONE 10 MG (21) PO TBPK: ORAL_TABLET | ORAL | 0 refills | Status: DC

## 2022-02-27 NOTE — Progress Notes (Signed)
..    Subjective:    Patient ID: Stephanie Torres, female    DOB: 13-May-1996, 26 y.o.   MRN: [id number]  HPI  26 year old female patient with history of asthma presents to the clinic for not productive cough, chest congestion, chest pressure, back pressure, wheezing, and shortness of breath x2 to 3 days.  Patient also admits to having sore throat x3 days.   Patient denies any fevers, body aches, palpitations.   Review of Systems  Respiratory:  Positive for cough, shortness of breath and wheezing.        Objective:   Physical Exam Vitals reviewed.  Constitutional:      General: She is not in acute distress.    Appearance: Normal appearance. She is normal weight. She is not ill-appearing, toxic-appearing or diaphoretic.  HENT:     Head: Normocephalic and atraumatic.  Neck:     Vascular: No carotid bruit.  Cardiovascular:     Rate and Rhythm: Normal rate and regular rhythm.     Pulses: Normal pulses.     Heart sounds: Normal heart sounds. No murmur heard. Pulmonary:     Effort: Pulmonary effort is normal. No respiratory distress.     Breath sounds: Wheezing present.     Comments: Expiratory wheezing located to bilateral upper lobes Musculoskeletal:     Cervical back: Normal range of motion and neck supple. No rigidity or tenderness.     Comments: Grossly intact  Lymphadenopathy:     Cervical: No cervical adenopathy.  Skin:    General: Skin is warm.     Capillary Refill: Capillary refill takes less than 2 seconds.  Neurological:     Mental Status: She is alert.     Comments: Grossly intact  Psychiatric:        Mood and Affect: Mood normal.        Behavior: Behavior normal.         Assessment & Plan:   1. SOB (shortness of breath) / Mild intermittent asthma with exacerbation -Likely secondary to viral etiology -EKG at baseline. -Called in 1 albuterol inhaler to local pharmacy, [facility], to prevent patient from having to wait to drive to eating before  getting her albuterol inhaler. -We will also get chest x-ray to rule out pneumonia - DG Chest 2 View -Secondary albuterol and prednisone sent to [facility] in [place]. - albuterol (VENTOLIN HFA) 108 (90 Base) MCG/ACT inhaler; Inhale 2 puffs into the lungs every 6 (six) hours as needed for wheezing or shortness of breath.  Dispense: 1 each; Refill: 2 - EKG 12-Lead - predniSONE (STERAPRED UNI-PAK 21 TAB) 10 MG (21) TBPK tablet; 6 tablets on day 1; decrease by 1 tablet daily until gone.  Dispense: 21 tablet; Refill: 0  2. Acute cough -We will rule out COVID. -Patient unable to provide strep sample due to gag reflex. - COVID-19, Flu A+B and RSV  Return to clinic if symptoms worsen or progress.  Go to emergency room if breathing gets worse.  Patient states understanding    Note:  This document was prepared using Dragon voice recognition software and may include unintentional dictation errors. Note - This record has been created using [employer].  Chart creation errors have been sought, but may not always  have been located. Such creation errors do not reflect on  the standard of medical care.

## 2022-02-28 ENCOUNTER — Ambulatory Visit (HOSPITAL_COMMUNITY)
Admission: RE | Admit: 2022-02-28 | Discharge: 2022-02-28 | Disposition: A | Discharge status: Home / Self Care | Payer: Medicaid Other | Source: Ambulatory Visit | Attending: Nurse Practitioner | Admitting: Nurse Practitioner

## 2022-02-28 DIAGNOSIS — R0602 Shortness of breath: Secondary | ICD-10-CM | POA: Insufficient documentation

## 2022-03-01 LAB — COVID-19, FLU A+B AND RSV
Influenza A, NAA: NOT DETECTED
Influenza B, NAA: NOT DETECTED
RSV, NAA: NOT DETECTED
SARS-CoV-2, NAA: NOT DETECTED

## 2022-03-27 ENCOUNTER — Ambulatory Visit: Payer: Medicaid Other | Admitting: Family Medicine

## 2022-03-27 DIAGNOSIS — F32A Depression, unspecified: Secondary | ICD-10-CM

## 2022-03-27 DIAGNOSIS — F419 Anxiety disorder, unspecified: Secondary | ICD-10-CM | POA: Diagnosis not present

## 2022-03-27 MED ORDER — CITALOPRAM HYDROBROMIDE 10 MG PO TABS: 10.0000 mg | ORAL_TABLET | Freq: Every day | ORAL | 3 refills | Status: AC

## 2022-03-27 MED ORDER — BUSPIRONE HCL 7.5 MG PO TABS: 7.5000 mg | ORAL_TABLET | Freq: Two times a day (BID) | ORAL | 1 refills | Status: AC

## 2022-03-27 NOTE — Assessment & Plan Note (Signed)
Patient's anxiety and depression is uncontrolled.  Worsening.  Patient is not on any pharmacotherapy at this time.  Starting Celexa.  Starting BuSpar.  Advised patient that she needs to take as prescribed and continue unless recommended not to do so by a provider.

## 2022-03-27 NOTE — Progress Notes (Signed)
Subjective:  Patient ID: Stephanie Torres, female    DOB: 08-30-1995  Age: 26 y.o. MRN: [id number]  CC: Chief Complaint  Patient presents with   anxiety and depression    HPI:  26 year old female presents for evaluation above.  Patient states that she is struggling with anxiety and depression.  She states that she is not currently on any medication.  She would like to restart medication.  She states that she has 2 jobs.  She is irritable and has difficulty handling her emotions.  She has longstanding history of anxiety and depression.  No other reported recent stressors.  PHQ-9 score of 22.  GAD-7 score of 16.  Patient Active Problem List   Diagnosis Date Noted   Suicidal overdose, sequela ([place]) 04/27/2017   Anxiety and depression 04/27/2017   PCOS (polycystic ovarian syndrome) 04/11/2013   Iron deficiency anemia 11/21/2012   Other and unspecified hyperlipidemia 11/21/2012   Migraine headache without aura 10/31/2012   Morbid obesity ([place]) 10/31/2012    Social Hx   Social History   Socioeconomic History   Marital status: Married    Spouse name: Not on file   Number of children: 1   Years of education: Not on file   Highest education level: Not on file  Occupational History   Not on file  Tobacco Use   Smoking status: Never   Smokeless tobacco: Never  Vaping Use   Vaping Use: Never used  Substance and Sexual Activity   Alcohol use: No   Drug use: No   Sexual activity: Yes  Other Topics Concern   Not on file  Social History Narrative   Not on file   Social Determinants of Health   Financial Resource Strain: Not on file  Food Insecurity: Not on file  Transportation Needs: Not on file  Physical Activity: Not on file  Stress: Not on file  Social Connections: Not on file    Review of Systems Per HPI  Objective:  BP 131/84   Pulse 81   Ht '5\' 4"'$  (1.626 m)   Wt 257 lb (116.6 kg)   LMP 02/02/2022 (Approximate)   SpO2 100%   BMI 44.11 kg/m       03/27/2022   10:04 AM 02/27/2022    1:20 PM 12/30/2021   11:12 AM  BP/Weight  Systolic BP 017 494 496  Diastolic BP 84 84 83  Wt. (Lbs) 257 259 258.4  BMI 44.11 kg/m2 44.46 kg/m2 44.35 kg/m2    Physical Exam Vitals and nursing note reviewed.  Constitutional:      General: She is not in acute distress.    Appearance: Normal appearance. She is obese. She is not ill-appearing.  HENT:     Head: Normocephalic and atraumatic.  Cardiovascular:     Rate and Rhythm: Normal rate and regular rhythm.  Pulmonary:     Effort: Pulmonary effort is normal.     Breath sounds: Normal breath sounds.  Neurological:     Mental Status: She is alert.  Psychiatric:     Comments: Flat affect.  Depressed mood.    Lab Results  Component Value Date   WBC 10.9 (H) 10/10/2020   HGB 11.4 (L) 10/10/2020   HCT 36.7 10/10/2020   PLT 362 10/10/2020   GLUCOSE 117 (H) 10/10/2020   CHOL 234 (H) 11/12/2012   TRIG 170 (H) 11/12/2012   HDL 48 11/12/2012   LDLCALC 152 (H) 11/12/2012   ALT 17 10/10/2020   AST 18 10/10/2020  NA 137 10/10/2020   K 3.8 10/10/2020   CL 105 10/10/2020   CREATININE 0.61 10/10/2020   BUN 8 10/10/2020   CO2 26 10/10/2020   TSH 6.353 (H) 11/12/2012   HGBA1C 5.7 (H) 10/14/2020     Assessment & Plan:   Problem List Items Addressed This Visit       Other   Anxiety and depression    Patient's anxiety and depression is uncontrolled.  Worsening.  Patient is not on any pharmacotherapy at this time.  Starting Celexa.  Starting BuSpar.  Advised patient that she needs to take as prescribed and continue unless recommended not to do so by a provider.        Relevant Medications   citalopram (CELEXA) 10 MG tablet   busPIRone (BUSPAR) 7.5 MG tablet    Meds ordered this encounter  Medications   citalopram (CELEXA) 10 MG tablet    Sig: Take 1 tablet (10 mg total) by mouth daily.    Dispense:  90 tablet    Refill:  3   busPIRone (BUSPAR) 7.5 MG tablet    Sig: Take 1 tablet (7.5  mg total) by mouth 2 (two) times daily.    Dispense:  180 tablet    Refill:  1    Follow-up: 1 month  [place]

## 2022-03-27 NOTE — Patient Instructions (Signed)
Medications as prescribed.  Follow up in 1 month.  Take care  Dr. Lacinda Axon

## 2022-05-16 DIAGNOSIS — J40 Bronchitis, not specified as acute or chronic: Secondary | ICD-10-CM | POA: Diagnosis not present

## 2022-06-02 DIAGNOSIS — Z3201 Encounter for pregnancy test, result positive: Secondary | ICD-10-CM | POA: Diagnosis not present

## 2022-06-10 DIAGNOSIS — Z20822 Contact with and (suspected) exposure to covid-19: Secondary | ICD-10-CM | POA: Diagnosis not present

## 2022-06-10 DIAGNOSIS — F32A Depression, unspecified: Secondary | ICD-10-CM | POA: Diagnosis not present

## 2022-06-10 DIAGNOSIS — J45909 Unspecified asthma, uncomplicated: Secondary | ICD-10-CM | POA: Diagnosis not present

## 2022-06-10 DIAGNOSIS — J069 Acute upper respiratory infection, unspecified: Secondary | ICD-10-CM | POA: Diagnosis not present

## 2022-06-10 DIAGNOSIS — Z7951 Long term (current) use of inhaled steroids: Secondary | ICD-10-CM | POA: Diagnosis not present

## 2022-06-10 DIAGNOSIS — Z888 Allergy status to other drugs, medicaments and biological substances status: Secondary | ICD-10-CM | POA: Diagnosis not present

## 2022-06-10 DIAGNOSIS — J180 Bronchopneumonia, unspecified organism: Secondary | ICD-10-CM | POA: Diagnosis not present

## 2022-06-10 DIAGNOSIS — R0789 Other chest pain: Secondary | ICD-10-CM | POA: Diagnosis not present

## 2022-06-10 DIAGNOSIS — R051 Acute cough: Secondary | ICD-10-CM | POA: Diagnosis not present

## 2022-06-10 DIAGNOSIS — Z3490 Encounter for supervision of normal pregnancy, unspecified, unspecified trimester: Secondary | ICD-10-CM | POA: Diagnosis not present

## 2022-06-10 DIAGNOSIS — F419 Anxiety disorder, unspecified: Secondary | ICD-10-CM | POA: Diagnosis not present

## 2022-06-10 DIAGNOSIS — Z79899 Other long term (current) drug therapy: Secondary | ICD-10-CM | POA: Diagnosis not present

## 2022-06-12 ENCOUNTER — Telehealth: Payer: Self-pay | Admitting: *Deleted

## 2022-06-12 NOTE — Telephone Encounter (Signed)
Transition Care Management Unsuccessful Follow-up Telephone Call  Date of discharge and from where:  06/10/2022 [facility] ER   Attempts:  1st Attempt  Reason for unsuccessful TCM follow-up call:Unable to leave voicemail

## 2022-06-26 DIAGNOSIS — N912 Amenorrhea, unspecified: Secondary | ICD-10-CM | POA: Diagnosis not present

## 2022-06-26 DIAGNOSIS — R7989 Other specified abnormal findings of blood chemistry: Secondary | ICD-10-CM | POA: Diagnosis not present

## 2022-06-26 DIAGNOSIS — Z3201 Encounter for pregnancy test, result positive: Secondary | ICD-10-CM | POA: Diagnosis not present

## 2022-06-26 DIAGNOSIS — O368390 Maternal care for abnormalities of the fetal heart rate or rhythm, unspecified trimester, not applicable or unspecified: Secondary | ICD-10-CM | POA: Diagnosis not present

## 2022-06-26 DIAGNOSIS — Z3689 Encounter for other specified antenatal screening: Secondary | ICD-10-CM | POA: Diagnosis not present

## 2022-07-04 DIAGNOSIS — Z3689 Encounter for other specified antenatal screening: Secondary | ICD-10-CM | POA: Diagnosis not present

## 2022-07-04 DIAGNOSIS — O021 Missed abortion: Secondary | ICD-10-CM | POA: Diagnosis not present

## 2022-07-04 DIAGNOSIS — O368390 Maternal care for abnormalities of the fetal heart rate or rhythm, unspecified trimester, not applicable or unspecified: Secondary | ICD-10-CM | POA: Diagnosis not present

## 2022-07-12 DIAGNOSIS — F32A Depression, unspecified: Secondary | ICD-10-CM | POA: Diagnosis not present

## 2022-07-12 DIAGNOSIS — D649 Anemia, unspecified: Secondary | ICD-10-CM | POA: Diagnosis not present

## 2022-07-12 DIAGNOSIS — J45909 Unspecified asthma, uncomplicated: Secondary | ICD-10-CM | POA: Diagnosis not present

## 2022-07-12 DIAGNOSIS — N939 Abnormal uterine and vaginal bleeding, unspecified: Secondary | ICD-10-CM | POA: Diagnosis not present

## 2022-07-12 DIAGNOSIS — F419 Anxiety disorder, unspecified: Secondary | ICD-10-CM | POA: Diagnosis not present

## 2022-07-14 DIAGNOSIS — R1030 Lower abdominal pain, unspecified: Secondary | ICD-10-CM | POA: Diagnosis not present

## 2022-07-14 DIAGNOSIS — O034 Incomplete spontaneous abortion without complication: Secondary | ICD-10-CM | POA: Diagnosis not present

## 2022-07-14 DIAGNOSIS — O200 Threatened abortion: Secondary | ICD-10-CM | POA: Diagnosis not present

## 2022-07-14 DIAGNOSIS — J45901 Unspecified asthma with (acute) exacerbation: Secondary | ICD-10-CM | POA: Diagnosis not present

## 2022-07-14 DIAGNOSIS — N854 Malposition of uterus: Secondary | ICD-10-CM | POA: Diagnosis not present

## 2022-07-14 DIAGNOSIS — O26891 Other specified pregnancy related conditions, first trimester: Secondary | ICD-10-CM | POA: Diagnosis not present

## 2022-07-14 DIAGNOSIS — Z3A01 Less than 8 weeks gestation of pregnancy: Secondary | ICD-10-CM | POA: Diagnosis not present

## 2022-07-14 DIAGNOSIS — R9389 Abnormal findings on diagnostic imaging of other specified body structures: Secondary | ICD-10-CM | POA: Diagnosis not present

## 2022-10-03 DIAGNOSIS — L03113 Cellulitis of right upper limb: Secondary | ICD-10-CM | POA: Diagnosis not present

## 2022-10-03 DIAGNOSIS — L0390 Cellulitis, unspecified: Secondary | ICD-10-CM | POA: Diagnosis not present

## 2022-10-17 ENCOUNTER — Telehealth: Payer: Self-pay

## 2022-10-17 NOTE — Telephone Encounter (Signed)
..   Medicaid Managed Care   Unsuccessful Outreach Note  10/17/2022 Name: Stephanie Torres MRN: [id number] DOB: 24-Jan-1996  Referred by: Tommie Sams, DO Reason for referral : Appointment   An unsuccessful telephone outreach was attempted today. The patient was referred to the case management team for assistance with care management and care coordination.   Follow Up Plan: A HIPAA compliant phone message was left for the patient providing contact information and requesting a return call.  The care management team will reach out to the patient again over the next 7 days.   Weston Settle Care Guide  [facility] Managed  Care Guide [facility]  [phone]

## 2022-10-24 ENCOUNTER — Telehealth: Payer: Self-pay

## 2022-10-24 NOTE — Telephone Encounter (Signed)
..   Medicaid Managed Care   Unsuccessful Outreach Note  10/24/2022 Name: Stephanie Torres MRN: [id number] DOB: December 28, 1995  Referred by: Tommie Sams, DO Reason for referral : Appointment   A second unsuccessful telephone outreach was attempted today. The patient was referred to the case management team for assistance with care management and care coordination.   Follow Up Plan: A HIPAA compliant phone message was left for the patient providing contact information and requesting a return call.  The care management team will reach out to the patient again over the next 7 days.   Weston Settle Care Guide  [facility] Managed  Care Guide [facility]  [phone]

## 2022-10-27 DIAGNOSIS — J069 Acute upper respiratory infection, unspecified: Secondary | ICD-10-CM | POA: Diagnosis not present

## 2022-10-27 DIAGNOSIS — R059 Cough, unspecified: Secondary | ICD-10-CM | POA: Diagnosis not present

## 2022-10-27 DIAGNOSIS — Z20822 Contact with and (suspected) exposure to covid-19: Secondary | ICD-10-CM | POA: Diagnosis not present

## 2022-10-27 DIAGNOSIS — J45901 Unspecified asthma with (acute) exacerbation: Secondary | ICD-10-CM | POA: Diagnosis not present

## 2022-11-06 ENCOUNTER — Telehealth: Payer: Self-pay

## 2022-11-06 NOTE — Telephone Encounter (Signed)
..   Medicaid Managed Care   Unsuccessful Outreach Note  11/06/2022 Name: Stephanie Torres MRN: [id number] DOB: May 04, 1996  Referred by: Tommie Sams, DO Reason for referral : Appointment   Third unsuccessful telephone outreach was attempted today. The patient was referred to the case management team for assistance with care management and care coordination. The patient's primary care provider has been notified of our unsuccessful attempts to make or maintain contact with the patient. The care management team is pleased to engage with this patient at any time in the future should he/she be interested in assistance from the care management team.   Follow Up Plan: We have been unable to make contact with the patient for follow up. The care management team is available to follow up with the patient after provider conversation with the patient regarding recommendation for care management engagement and subsequent re-referral to the care management team.   Weston Settle Care Guide  [facility] Managed  Care Guide [facility]  [phone]

## 2023-02-26 DIAGNOSIS — R519 Headache, unspecified: Secondary | ICD-10-CM | POA: Diagnosis not present

## 2023-02-26 DIAGNOSIS — Z20822 Contact with and (suspected) exposure to covid-19: Secondary | ICD-10-CM | POA: Diagnosis not present

## 2023-02-26 DIAGNOSIS — J069 Acute upper respiratory infection, unspecified: Secondary | ICD-10-CM | POA: Diagnosis not present

## 2023-03-07 DIAGNOSIS — F411 Generalized anxiety disorder: Secondary | ICD-10-CM | POA: Diagnosis not present

## 2023-03-07 DIAGNOSIS — F4310 Post-traumatic stress disorder, unspecified: Secondary | ICD-10-CM | POA: Diagnosis not present

## 2023-03-07 DIAGNOSIS — F332 Major depressive disorder, recurrent severe without psychotic features: Secondary | ICD-10-CM | POA: Diagnosis not present

## 2023-03-14 DIAGNOSIS — F4310 Post-traumatic stress disorder, unspecified: Secondary | ICD-10-CM | POA: Diagnosis not present

## 2023-03-14 DIAGNOSIS — F332 Major depressive disorder, recurrent severe without psychotic features: Secondary | ICD-10-CM | POA: Diagnosis not present

## 2023-03-14 DIAGNOSIS — F411 Generalized anxiety disorder: Secondary | ICD-10-CM | POA: Diagnosis not present

## 2023-04-28 DIAGNOSIS — R070 Pain in throat: Secondary | ICD-10-CM | POA: Diagnosis not present

## 2023-04-28 DIAGNOSIS — Z20822 Contact with and (suspected) exposure to covid-19: Secondary | ICD-10-CM | POA: Diagnosis not present

## 2023-04-28 DIAGNOSIS — R519 Headache, unspecified: Secondary | ICD-10-CM | POA: Diagnosis not present

## 2023-04-28 DIAGNOSIS — J069 Acute upper respiratory infection, unspecified: Secondary | ICD-10-CM | POA: Diagnosis not present

## 2023-06-08 DIAGNOSIS — J069 Acute upper respiratory infection, unspecified: Secondary | ICD-10-CM | POA: Diagnosis not present

## 2023-06-08 DIAGNOSIS — R062 Wheezing: Secondary | ICD-10-CM | POA: Diagnosis not present

## 2023-10-17 DIAGNOSIS — L308 Other specified dermatitis: Secondary | ICD-10-CM | POA: Diagnosis not present

## 2023-12-10 DIAGNOSIS — F411 Generalized anxiety disorder: Secondary | ICD-10-CM | POA: Diagnosis not present

## 2023-12-10 DIAGNOSIS — F4310 Post-traumatic stress disorder, unspecified: Secondary | ICD-10-CM | POA: Diagnosis not present

## 2023-12-10 DIAGNOSIS — F332 Major depressive disorder, recurrent severe without psychotic features: Secondary | ICD-10-CM | POA: Diagnosis not present

## 2024-01-04 DIAGNOSIS — F4310 Post-traumatic stress disorder, unspecified: Secondary | ICD-10-CM | POA: Diagnosis not present

## 2024-01-04 DIAGNOSIS — F411 Generalized anxiety disorder: Secondary | ICD-10-CM | POA: Diagnosis not present

## 2024-01-04 DIAGNOSIS — F332 Major depressive disorder, recurrent severe without psychotic features: Secondary | ICD-10-CM | POA: Diagnosis not present

## 2024-01-21 DIAGNOSIS — F4310 Post-traumatic stress disorder, unspecified: Secondary | ICD-10-CM | POA: Diagnosis not present

## 2024-01-21 DIAGNOSIS — F411 Generalized anxiety disorder: Secondary | ICD-10-CM | POA: Diagnosis not present

## 2024-01-21 DIAGNOSIS — F332 Major depressive disorder, recurrent severe without psychotic features: Secondary | ICD-10-CM | POA: Diagnosis not present

## 2024-01-25 ENCOUNTER — Ambulatory Visit: Payer: Self-pay

## 2024-01-25 NOTE — Telephone Encounter (Signed)
 FYI Only or Action Required?: Action required by provider: [facility] request.  Patient was last seen in primary care on 03/27/2022 by Cook, Jayce G, DO.  Called Nurse Triage reporting Anxious.  Symptoms began Chronic.  Interventions attempted: Other: established with psychiatry.  Symptoms are: unchanged.  Triage Disposition: See Physician Within 24 Hours  Patient/caregiver understands and will follow disposition?: yes   Copied from CRM #[id number]. Topic: Clinical - Red Word Triage >> Jan 25, 2024  3:31 PM Tobias CROME wrote: Red Word that prompted transfer to Nurse Triage: Patient was seeing psychiatrist, needing physical worried about BP. Patient has been having a lot of shakiness, rapid heart rate and nervousness  Looking to establish care with [facility] Reason for Disposition  Patient sounds very upset or troubled to the triager  Answer Assessment - Initial Assessment Questions 1. CONCERN: Did anything happen that prompted you to call today?     Wants to transfer care from [facility] to [facility]. Increased anxiety-managed by [facility]iatry appointment on Monday 2. ANXIETY SYMPTOMS: Can you describe how you (your loved one; patient) have been feeling? (e.g., tense, restless, panicky, anxious, keyed up, overwhelmed, sense of impending doom).      Overwhelmed, panicky, shakey 3. ONSET: How long have you been feeling this way? (e.g., hours, days, weeks)     ongoing 4. SEVERITY: How would you rate the level of anxiety? (e.g., 0 - 10; or mild, moderate, severe).     worsening 5. FUNCTIONAL IMPAIRMENT: How have these feelings affected your ability to do daily activities? Have you had more difficulty than usual doing your normal daily activities? (e.g., getting better, same, worse; self-care, school, work, interactions)     Difficulty leaving house, family told her she is acting different 6. HISTORY: Have you felt this way before? Have you  ever been diagnosed with an anxiety problem in the past? (e.g., generalized anxiety disorder, panic attacks, PTSD). If Yes, ask: How was this problem treated? (e.g., medicines, counseling, etc.)     chronic 7. RISK OF HARM - SUICIDAL IDEATION: Do you ever have thoughts of hurting or killing yourself? If Yes, ask:  Do you have these feelings now? Do you have a plan on how you would do this?     denies 8. TREATMENT:  What has been done so far to treat this anxiety? (e.g., medicines, relaxation strategies). What has helped?     Has psychiatry appointment on Monday 9. THERAPIST: Do you have a counselor or therapist? If Yes, ask: What is their name?      10. POTENTIAL TRIGGERS: Do you drink caffeinated beverages (e.g., coffee, colas, teas), and how much daily? Do you drink alcohol or use any drugs? Have you started any new medicines recently?        11. PATIENT SUPPORT: Who is with you now? Who do you live with? Do you have family or friends who you can talk to?        Family 12. OTHER SYMPTOMS: Do you have any other symptoms? (e.g., feeling depressed, trouble concentrating, trouble sleeping, trouble breathing, palpitations or fast heartbeat, chest pain, sweating, nausea, or diarrhea)       Poor appetite. Occasional headache-not now, twinges of chest pain-non now, worried for BP-no recent reading, heart racing intermittently.  Physical symptom present during call is shaking from anxiety.  Scheduled TOC appointment to establish with new provider. She will proceed to urgent care for heart racing. Has appointment scheduled with psychiatry on Monday.  Protocols used: Anxiety and Panic Attack-A-AH

## 2024-01-28 DIAGNOSIS — F332 Major depressive disorder, recurrent severe without psychotic features: Secondary | ICD-10-CM | POA: Diagnosis not present

## 2024-01-28 DIAGNOSIS — F411 Generalized anxiety disorder: Secondary | ICD-10-CM | POA: Diagnosis not present

## 2024-01-28 DIAGNOSIS — F4310 Post-traumatic stress disorder, unspecified: Secondary | ICD-10-CM | POA: Diagnosis not present

## 2024-02-15 DIAGNOSIS — F411 Generalized anxiety disorder: Secondary | ICD-10-CM | POA: Diagnosis not present

## 2024-02-15 DIAGNOSIS — F332 Major depressive disorder, recurrent severe without psychotic features: Secondary | ICD-10-CM | POA: Diagnosis not present

## 2024-02-15 DIAGNOSIS — F4310 Post-traumatic stress disorder, unspecified: Secondary | ICD-10-CM | POA: Diagnosis not present

## 2024-03-14 DIAGNOSIS — F411 Generalized anxiety disorder: Secondary | ICD-10-CM | POA: Diagnosis not present

## 2024-03-14 DIAGNOSIS — F4310 Post-traumatic stress disorder, unspecified: Secondary | ICD-10-CM | POA: Diagnosis not present

## 2024-03-14 DIAGNOSIS — F332 Major depressive disorder, recurrent severe without psychotic features: Secondary | ICD-10-CM | POA: Diagnosis not present

## 2024-03-17 DIAGNOSIS — F4310 Post-traumatic stress disorder, unspecified: Secondary | ICD-10-CM | POA: Diagnosis not present

## 2024-03-17 DIAGNOSIS — F411 Generalized anxiety disorder: Secondary | ICD-10-CM | POA: Diagnosis not present

## 2024-03-17 DIAGNOSIS — F332 Major depressive disorder, recurrent severe without psychotic features: Secondary | ICD-10-CM | POA: Diagnosis not present

## 2024-04-17 DIAGNOSIS — F332 Major depressive disorder, recurrent severe without psychotic features: Secondary | ICD-10-CM | POA: Diagnosis not present

## 2024-04-17 DIAGNOSIS — F411 Generalized anxiety disorder: Secondary | ICD-10-CM | POA: Diagnosis not present

## 2024-04-17 DIAGNOSIS — F4310 Post-traumatic stress disorder, unspecified: Secondary | ICD-10-CM | POA: Diagnosis not present

## 2024-05-08 DIAGNOSIS — F4310 Post-traumatic stress disorder, unspecified: Secondary | ICD-10-CM | POA: Diagnosis not present

## 2024-05-08 DIAGNOSIS — F411 Generalized anxiety disorder: Secondary | ICD-10-CM | POA: Diagnosis not present

## 2024-05-22 DIAGNOSIS — F411 Generalized anxiety disorder: Secondary | ICD-10-CM | POA: Diagnosis not present

## 2024-05-22 DIAGNOSIS — F4310 Post-traumatic stress disorder, unspecified: Secondary | ICD-10-CM | POA: Diagnosis not present

## 2024-05-27 ENCOUNTER — Ambulatory Visit: Payer: Self-pay

## 2024-05-27 DIAGNOSIS — J4521 Mild intermittent asthma with (acute) exacerbation: Secondary | ICD-10-CM | POA: Diagnosis not present

## 2024-05-27 DIAGNOSIS — R0602 Shortness of breath: Secondary | ICD-10-CM

## 2024-05-27 MED ORDER — ALBUTEROL SULFATE HFA 108 (90 BASE) MCG/ACT IN AERS: 2.0000 | INHALATION_SPRAY | Freq: Four times a day (QID) | RESPIRATORY_TRACT | 2 refills | Status: AC | PRN

## 2024-05-27 NOTE — Progress Notes (Signed)
 "  New Patient Office Visit  Subjective    Patient ID: Stephanie Torres, female    DOB: 30-Oct-1995  Age: 28 y.o. MRN: [id number]  CC:  Chief Complaint  Patient presents with   Establish Care    New pt appt     HPI Stephanie Torres presents to establish care Discussed the use of AI scribe software for clinical note transcription with the patient, who gave verbal consent to proceed.   Outpatient Encounter Medications as of 05/27/2024  Medication Sig   ARIPiprazole (ABILIFY) 5 MG tablet Take 5 mg by mouth daily.   desvenlafaxine (PRISTIQ) 50 MG 24 hr tablet Take 50 mg by mouth daily.   [DISCONTINUED] albuterol  (VENTOLIN  HFA) 108 (90 Base) MCG/ACT inhaler Inhale 2 puffs into the lungs every 6 (six) hours as needed for wheezing or shortness of breath.   albuterol  (VENTOLIN  HFA) 108 (90 Base) MCG/ACT inhaler Inhale 2 puffs into the lungs every 6 (six) hours as needed for wheezing or shortness of breath.   busPIRone  (BUSPAR ) 7.5 MG tablet Take 1 tablet (7.5 mg total) by mouth 2 (two) times daily.   citalopram  (CELEXA ) 10 MG tablet Take 1 tablet (10 mg total) by mouth daily.   [DISCONTINUED] clonazePAM  (KLONOPIN ) 0.5 MG tablet Take 1 tablet (0.5 mg total) by mouth 2 (two) times daily as needed for anxiety. (Patient not taking: Reported on 12/30/2021)   [DISCONTINUED] hydrOXYzine  (VISTARIL ) 25 MG capsule Take 1 capsule (25 mg total) by mouth every 8 (eight) hours as needed for itching. (Patient not taking: Reported on 03/27/2022)   No facility-administered encounter medications on file as of 05/27/2024.    Past Medical History:  Diagnosis Date   Anxiety    Asthma    Depression    DUB (dysfunctional uterine bleeding)    Heartburn    PCOS (polycystic ovarian syndrome)     Past Surgical History:  Procedure Laterality Date   CESAREAN SECTION     CYSTOSCOPY WITH RETROGRADE PYELOGRAM, URETEROSCOPY AND STENT PLACEMENT Right 02/26/2020   Procedure: CYSTOSCOPY WITH RETROGRADE PYELOGRAM,  URETEROSCOPY WITH STENT PLACEMENT;  Surgeon: Sherrilee Belvie CROME, MD;  Location: [facility];  Service: Urology;  Laterality: Right;   CYSTOSCOPY WITH RETROGRADE PYELOGRAM, URETEROSCOPY AND STENT PLACEMENT Right 03/11/2020   Procedure: CYSTOSCOPY WITH RIGHT RETROGRADE PYELOGRAM, RIGHT URETEROSCOPY AND RIGHT STENT EXCHANGE;  Surgeon: Sherrilee Belvie CROME, MD;  Location: [facility];  Service: Urology;  Laterality: Right;   HOLMIUM LASER APPLICATION  03/11/2020   Procedure: HOLMIUM LASER APPLICATION;  Surgeon: Sherrilee Belvie CROME, MD;  Location: [facility];  Service: Urology;;   TONSILLECTOMY      Family History  Problem Relation Age of Onset   Diabetes Mother    Hypertension Mother     Social History   Socioeconomic History   Marital status: Married    Spouse name: Not on file   Number of children: 1   Years of education: Not on file   Highest education level: Not on file  Occupational History   Not on file  Tobacco Use   Smoking status: Never   Smokeless tobacco: Never  Vaping Use   Vaping status: Never Used  Substance and Sexual Activity   Alcohol use: No   Drug use: No   Sexual activity: Yes  Other Topics Concern   Not on file  Social History Narrative   Not on file   Social Drivers of Health   Tobacco Use: Low Risk (05/27/2024)   Patient History  Smoking Tobacco Use: Never    Smokeless Tobacco Use: Never    Passive Exposure: Not on file  Financial Resource Strain: Low Risk (05/27/2024)   Overall Financial Resource Strain (CARDIA)    Difficulty of Paying Living Expenses: Not hard at all  Food Insecurity: No Food Insecurity (05/27/2024)   Epic    Worried About Programme Researcher, Broadcasting/film/video in the Last Year: Never true    Ran Out of Food in the Last Year: Never true  Transportation Needs: No Transportation Needs (05/27/2024)   Epic    Lack of Transportation (Medical): No    Lack of Transportation (Non-Medical): No  Physical Activity: Inactive (05/27/2024)   Exercise Vital Sign     Days of Exercise per Week: 0 days    Minutes of Exercise per Session: 0 min  Stress: No Stress Concern Present (05/27/2024)   [employer] of Occupational Health - Occupational Stress Questionnaire    Feeling of Stress: Not at all  Social Connections: Moderately Isolated (05/27/2024)   Social Connection and Isolation Panel    Frequency of Communication with Friends and Family: More than three times a week    Frequency of Social Gatherings with Friends and Family: Three times a week    Attends Religious Services: Never    Active Member of Clubs or Organizations: No    Attends Banker Meetings: Never    Marital Status: Married  Catering Manager Violence: Not At Risk (05/27/2024)   Epic    Fear of Current or Ex-Partner: No    Emotionally Abused: No    Physically Abused: No    Sexually Abused: No  Depression (PHQ2-9): High Risk (05/27/2024)   Depression (PHQ2-9)    PHQ-2 Score: 15  Alcohol Screen: Low Risk (05/27/2024)   Alcohol Screen    Last Alcohol Screening Score (AUDIT): 0  Housing: Low Risk (05/27/2024)   Epic    Unable to Pay for Housing in the Last Year: No    Number of Times Moved in the Last Year: 0    Homeless in the Last Year: No  Utilities: Not At Risk (05/27/2024)   Epic    Threatened with loss of utilities: No  Health Literacy: Low Risk (06/26/2022)   Received from [facility] Literacy    How often do you need to have someone help you when you read instructions, pamphlets, or other written material from your doctor or pharmacy?: Never    ROS      Objective    BP 132/81   Pulse (!) 105   Resp 18   Ht 5' 4 (1.626 m)   Wt 273 lb 0.6 oz (123.9 kg)   LMP 05/14/2024 (Exact Date)   SpO2 97%   BMI 46.87 kg/m   Physical Exam Vitals and nursing note reviewed.  Constitutional:      Appearance: Normal appearance. She is obese.  HENT:     Head: Normocephalic.     Right Ear: Tympanic membrane, ear canal and external ear normal.      Left Ear: Tympanic membrane, ear canal and external ear normal.     Nose: Nose normal.     Mouth/Throat:     Mouth: Mucous membranes are moist.     Pharynx: Oropharynx is clear.  Eyes:     Extraocular Movements: Extraocular movements intact.     Conjunctiva/sclera: Conjunctivae normal.     Pupils: Pupils are equal, round, and reactive to light.  Cardiovascular:  Rate and Rhythm: Normal rate and regular rhythm.  Pulmonary:     Effort: Pulmonary effort is normal.     Breath sounds: Normal breath sounds.  Abdominal:     General: Bowel sounds are normal.     Palpations: Abdomen is soft.     Tenderness: There is no right CVA tenderness or left CVA tenderness.  Musculoskeletal:        General: Normal range of motion.     Cervical back: Normal range of motion and neck supple.  Skin:    General: Skin is warm and dry.  Neurological:     Mental Status: She is alert and oriented to person, place, and time.  Psychiatric:        Mood and Affect: Mood normal.        Thought Content: Thought content normal.         Assessment & Plan:   Problem List Items Addressed This Visit       Respiratory   Mild intermittent asthma with exacerbation   Agree to refill albuterol  for prn use.       Relevant Medications   albuterol  (VENTOLIN  HFA) 108 (90 Base) MCG/ACT inhaler     Other   SOB (shortness of breath)   Agree to refill albuterol  for prn use.       Relevant Medications   albuterol  (VENTOLIN  HFA) 108 (90 Base) MCG/ACT inhaler    Return in about 6 months (around 11/25/2024) for for yearly physical.   Leita Longs, FNP   "

## 2024-06-02 NOTE — Assessment & Plan Note (Signed)
 Agree to refill albuterol  for prn use.
